# Patient Record
Sex: Female | Born: 1965 | Hispanic: No | Marital: Married | Smoking: Never smoker
Health system: Southern US, Community
[De-identification: ages and names within clinical notes are randomized; demographics above are authoritative.]

## PROBLEM LIST (undated history)

## (undated) DIAGNOSIS — E119 Type 2 diabetes mellitus without complications: Secondary | ICD-10-CM

## (undated) DIAGNOSIS — F32A Depression, unspecified: Secondary | ICD-10-CM

## (undated) DIAGNOSIS — F329 Major depressive disorder, single episode, unspecified: Secondary | ICD-10-CM

## (undated) DIAGNOSIS — E739 Lactose intolerance, unspecified: Secondary | ICD-10-CM

## (undated) DIAGNOSIS — K760 Fatty (change of) liver, not elsewhere classified: Secondary | ICD-10-CM

## (undated) DIAGNOSIS — J309 Allergic rhinitis, unspecified: Secondary | ICD-10-CM

## (undated) DIAGNOSIS — R6889 Other general symptoms and signs: Secondary | ICD-10-CM

## (undated) DIAGNOSIS — F419 Anxiety disorder, unspecified: Secondary | ICD-10-CM

## (undated) DIAGNOSIS — R5383 Other fatigue: Secondary | ICD-10-CM

## (undated) DIAGNOSIS — I10 Essential (primary) hypertension: Secondary | ICD-10-CM

## (undated) DIAGNOSIS — K0889 Other specified disorders of teeth and supporting structures: Secondary | ICD-10-CM

## (undated) DIAGNOSIS — E785 Hyperlipidemia, unspecified: Secondary | ICD-10-CM

## (undated) DIAGNOSIS — R109 Unspecified abdominal pain: Secondary | ICD-10-CM

## (undated) DIAGNOSIS — N644 Mastodynia: Secondary | ICD-10-CM

## (undated) DIAGNOSIS — R1011 Right upper quadrant pain: Secondary | ICD-10-CM

## (undated) DIAGNOSIS — L239 Allergic contact dermatitis, unspecified cause: Secondary | ICD-10-CM

## (undated) DIAGNOSIS — L210 Seborrhea capitis: Secondary | ICD-10-CM

## (undated) DIAGNOSIS — R51 Headache: Secondary | ICD-10-CM

## (undated) DIAGNOSIS — M79622 Pain in left upper arm: Secondary | ICD-10-CM

## (undated) DIAGNOSIS — M25512 Pain in left shoulder: Secondary | ICD-10-CM

## (undated) DIAGNOSIS — J329 Chronic sinusitis, unspecified: Secondary | ICD-10-CM

## (undated) DIAGNOSIS — I456 Pre-excitation syndrome: Secondary | ICD-10-CM

## (undated) DIAGNOSIS — R1030 Lower abdominal pain, unspecified: Secondary | ICD-10-CM

## (undated) DIAGNOSIS — S161XXA Strain of muscle, fascia and tendon at neck level, initial encounter: Secondary | ICD-10-CM

## (undated) DIAGNOSIS — M2550 Pain in unspecified joint: Secondary | ICD-10-CM

## (undated) DIAGNOSIS — Z9289 Personal history of other medical treatment: Secondary | ICD-10-CM

## (undated) DIAGNOSIS — N912 Amenorrhea, unspecified: Secondary | ICD-10-CM

## (undated) DIAGNOSIS — Z9889 Other specified postprocedural states: Secondary | ICD-10-CM

## (undated) DIAGNOSIS — F450 Somatization disorder: Secondary | ICD-10-CM

## (undated) DIAGNOSIS — R1013 Epigastric pain: Secondary | ICD-10-CM

## (undated) DIAGNOSIS — L309 Dermatitis, unspecified: Secondary | ICD-10-CM

## (undated) DIAGNOSIS — N764 Abscess of vulva: Secondary | ICD-10-CM

## (undated) DIAGNOSIS — R935 Abnormal findings on diagnostic imaging of other abdominal regions, including retroperitoneum: Secondary | ICD-10-CM

## (undated) DIAGNOSIS — R0981 Nasal congestion: Secondary | ICD-10-CM

## (undated) DIAGNOSIS — R300 Dysuria: Secondary | ICD-10-CM

## (undated) HISTORY — DX: Essential (primary) hypertension: I10

## (undated) HISTORY — DX: Lower abdominal pain, unspecified: R10.30

## (undated) HISTORY — PX: APPENDECTOMY: SHX54

## (undated) HISTORY — DX: Somatization disorder: F45.0

## (undated) HISTORY — DX: Other fatigue: R53.83

## (undated) HISTORY — DX: Type 2 diabetes mellitus without complications: E11.9

## (undated) HISTORY — DX: Hyperlipidemia, unspecified: E78.5

## (undated) HISTORY — DX: Abnormal findings on diagnostic imaging of other abdominal regions, including retroperitoneum: R93.5

## (undated) HISTORY — DX: Pre-excitation syndrome: I45.6

## (undated) HISTORY — DX: Depression, unspecified: F32.A

## (undated) HISTORY — DX: Anxiety disorder, unspecified: F41.9

## (undated) HISTORY — DX: Nasal congestion: R09.81

## (undated) HISTORY — DX: Headache: R51

## (undated) HISTORY — DX: Other specified postprocedural states: Z98.890

## (undated) HISTORY — DX: Fatty (change of) liver, not elsewhere classified: K76.0

## (undated) HISTORY — DX: Pain in left shoulder: M25.512

## (undated) HISTORY — DX: Other general symptoms and signs: R68.89

## (undated) HISTORY — DX: Strain of muscle, fascia and tendon at neck level, initial encounter: S16.1XXA

## (undated) HISTORY — DX: Major depressive disorder, single episode, unspecified: F32.9

## (undated) HISTORY — DX: Dermatitis, unspecified: L30.9

## (undated) HISTORY — DX: Allergic rhinitis, unspecified: J30.9

## (undated) HISTORY — PX: OTHER SURGICAL HISTORY: SHX169

## (undated) HISTORY — DX: Lactose intolerance, unspecified: E73.9

## (undated) HISTORY — DX: Dysuria: R30.0

## (undated) HISTORY — DX: Pain in left upper arm: M79.622

## (undated) HISTORY — DX: Seborrhea capitis: L21.0

## (undated) HISTORY — DX: Epigastric pain: R10.13

## (undated) HISTORY — DX: Personal history of other medical treatment: Z92.89

## (undated) HISTORY — DX: Right upper quadrant pain: R10.11

## (undated) HISTORY — DX: Other specified disorders of teeth and supporting structures: K08.89

## (undated) HISTORY — DX: Unspecified abdominal pain: R10.9

## (undated) HISTORY — DX: Amenorrhea, unspecified: N91.2

## (undated) HISTORY — DX: Chronic sinusitis, unspecified: J32.9

## (undated) HISTORY — DX: Mastodynia: N64.4

## (undated) HISTORY — DX: Allergic contact dermatitis, unspecified cause: L23.9

## (undated) HISTORY — DX: Pain in unspecified joint: M25.50

## (undated) HISTORY — DX: Abscess of vulva: N76.4

---

## 2001-10-26 ENCOUNTER — Inpatient Hospital Stay (HOSPITAL_COMMUNITY): Admission: AD | Admit: 2001-10-26 | Discharge: 2001-10-26 | Payer: Self-pay | Admitting: *Deleted

## 2001-10-28 ENCOUNTER — Emergency Department (HOSPITAL_COMMUNITY): Admission: EM | Admit: 2001-10-28 | Discharge: 2001-10-28 | Payer: Self-pay | Admitting: *Deleted

## 2001-12-14 ENCOUNTER — Emergency Department (HOSPITAL_COMMUNITY): Admission: EM | Admit: 2001-12-14 | Discharge: 2001-12-14 | Payer: Self-pay | Admitting: Family Medicine

## 2002-01-18 ENCOUNTER — Encounter: Payer: Self-pay | Admitting: Obstetrics and Gynecology

## 2002-01-18 ENCOUNTER — Ambulatory Visit (HOSPITAL_COMMUNITY): Admission: AD | Admit: 2002-01-18 | Discharge: 2002-01-19 | Payer: Self-pay | Admitting: Obstetrics and Gynecology

## 2002-01-21 ENCOUNTER — Inpatient Hospital Stay (HOSPITAL_COMMUNITY): Admission: AD | Admit: 2002-01-21 | Discharge: 2002-01-21 | Payer: Self-pay | Admitting: Obstetrics & Gynecology

## 2002-01-25 ENCOUNTER — Inpatient Hospital Stay (HOSPITAL_COMMUNITY): Admission: RE | Admit: 2002-01-25 | Discharge: 2002-01-25 | Payer: Self-pay | Admitting: Obstetrics and Gynecology

## 2002-01-25 ENCOUNTER — Encounter: Payer: Self-pay | Admitting: Obstetrics and Gynecology

## 2002-01-31 ENCOUNTER — Encounter: Admission: RE | Admit: 2002-01-31 | Discharge: 2002-01-31 | Payer: Self-pay | Admitting: *Deleted

## 2002-02-07 ENCOUNTER — Encounter: Admission: RE | Admit: 2002-02-07 | Discharge: 2002-02-07 | Payer: Self-pay | Admitting: *Deleted

## 2002-02-11 ENCOUNTER — Encounter: Payer: Self-pay | Admitting: Obstetrics & Gynecology

## 2002-02-11 ENCOUNTER — Inpatient Hospital Stay: Admission: AD | Admit: 2002-02-11 | Discharge: 2002-02-11 | Payer: Self-pay | Admitting: Obstetrics and Gynecology

## 2002-02-13 ENCOUNTER — Encounter: Admission: RE | Admit: 2002-02-13 | Discharge: 2002-02-13 | Payer: Self-pay | Admitting: *Deleted

## 2002-02-21 ENCOUNTER — Encounter: Admission: RE | Admit: 2002-02-21 | Discharge: 2002-02-21 | Payer: Self-pay | Admitting: *Deleted

## 2002-03-18 ENCOUNTER — Inpatient Hospital Stay (HOSPITAL_COMMUNITY): Admission: AD | Admit: 2002-03-18 | Discharge: 2002-03-18 | Payer: Self-pay | Admitting: *Deleted

## 2002-05-08 ENCOUNTER — Inpatient Hospital Stay (HOSPITAL_COMMUNITY): Admission: AD | Admit: 2002-05-08 | Discharge: 2002-05-08 | Payer: Self-pay | Admitting: Obstetrics and Gynecology

## 2002-05-10 ENCOUNTER — Inpatient Hospital Stay (HOSPITAL_COMMUNITY): Admission: AD | Admit: 2002-05-10 | Discharge: 2002-05-10 | Payer: Self-pay | Admitting: Obstetrics and Gynecology

## 2002-05-12 ENCOUNTER — Inpatient Hospital Stay (HOSPITAL_COMMUNITY): Admission: AD | Admit: 2002-05-12 | Discharge: 2002-05-12 | Payer: Self-pay | Admitting: *Deleted

## 2002-05-14 ENCOUNTER — Ambulatory Visit (HOSPITAL_COMMUNITY): Admission: RE | Admit: 2002-05-14 | Discharge: 2002-05-14 | Payer: Self-pay | Admitting: *Deleted

## 2002-05-14 ENCOUNTER — Encounter: Payer: Self-pay | Admitting: *Deleted

## 2002-05-22 ENCOUNTER — Encounter: Admission: RE | Admit: 2002-05-22 | Discharge: 2002-05-22 | Payer: Self-pay | Admitting: *Deleted

## 2002-06-05 ENCOUNTER — Encounter: Admission: RE | Admit: 2002-06-05 | Discharge: 2002-06-05 | Payer: Self-pay | Admitting: *Deleted

## 2002-06-06 ENCOUNTER — Encounter (HOSPITAL_COMMUNITY): Admission: RE | Admit: 2002-06-06 | Discharge: 2002-09-04 | Payer: Self-pay | Admitting: Dentistry

## 2002-06-19 ENCOUNTER — Encounter: Admission: RE | Admit: 2002-06-19 | Discharge: 2002-06-19 | Payer: Self-pay | Admitting: *Deleted

## 2002-06-24 ENCOUNTER — Inpatient Hospital Stay (HOSPITAL_COMMUNITY): Admission: AD | Admit: 2002-06-24 | Discharge: 2002-06-24 | Payer: Self-pay | Admitting: *Deleted

## 2002-07-03 ENCOUNTER — Encounter: Admission: RE | Admit: 2002-07-03 | Discharge: 2002-07-03 | Payer: Self-pay | Admitting: *Deleted

## 2002-07-10 ENCOUNTER — Encounter (HOSPITAL_COMMUNITY): Admission: RE | Admit: 2002-07-10 | Discharge: 2002-08-09 | Payer: Self-pay | Admitting: Obstetrics and Gynecology

## 2002-07-10 ENCOUNTER — Encounter: Admission: RE | Admit: 2002-07-10 | Discharge: 2002-07-10 | Payer: Self-pay | Admitting: *Deleted

## 2002-07-17 ENCOUNTER — Inpatient Hospital Stay (HOSPITAL_COMMUNITY): Admission: AD | Admit: 2002-07-17 | Discharge: 2002-07-17 | Payer: Self-pay | Admitting: *Deleted

## 2002-07-24 ENCOUNTER — Encounter: Admission: RE | Admit: 2002-07-24 | Discharge: 2002-07-24 | Payer: Self-pay | Admitting: *Deleted

## 2002-08-07 ENCOUNTER — Encounter: Admission: RE | Admit: 2002-08-07 | Discharge: 2002-08-07 | Payer: Self-pay | Admitting: *Deleted

## 2002-08-21 ENCOUNTER — Encounter: Admission: RE | Admit: 2002-08-21 | Discharge: 2002-08-21 | Payer: Self-pay | Admitting: *Deleted

## 2002-08-28 ENCOUNTER — Encounter: Admission: RE | Admit: 2002-08-28 | Discharge: 2002-08-28 | Payer: Self-pay | Admitting: *Deleted

## 2002-09-04 ENCOUNTER — Encounter: Admission: RE | Admit: 2002-09-04 | Discharge: 2002-09-04 | Payer: Self-pay | Admitting: *Deleted

## 2002-09-11 ENCOUNTER — Encounter: Admission: RE | Admit: 2002-09-11 | Discharge: 2002-09-11 | Payer: Self-pay | Admitting: *Deleted

## 2002-09-11 ENCOUNTER — Inpatient Hospital Stay (HOSPITAL_COMMUNITY): Admission: AD | Admit: 2002-09-11 | Discharge: 2002-09-11 | Payer: Self-pay | Admitting: Family Medicine

## 2002-09-13 ENCOUNTER — Inpatient Hospital Stay (HOSPITAL_COMMUNITY): Admission: AD | Admit: 2002-09-13 | Discharge: 2002-09-21 | Payer: Self-pay | Admitting: *Deleted

## 2002-09-20 ENCOUNTER — Encounter: Payer: Self-pay | Admitting: *Deleted

## 2002-09-30 ENCOUNTER — Inpatient Hospital Stay (HOSPITAL_COMMUNITY): Admission: AD | Admit: 2002-09-30 | Discharge: 2002-09-30 | Payer: Self-pay | Admitting: *Deleted

## 2002-10-04 ENCOUNTER — Inpatient Hospital Stay (HOSPITAL_COMMUNITY): Admission: AD | Admit: 2002-10-04 | Discharge: 2002-10-04 | Payer: Self-pay | Admitting: Family Medicine

## 2005-01-10 DIAGNOSIS — R109 Unspecified abdominal pain: Secondary | ICD-10-CM | POA: Insufficient documentation

## 2005-01-10 HISTORY — DX: Unspecified abdominal pain: R10.9

## 2005-01-11 ENCOUNTER — Ambulatory Visit (HOSPITAL_COMMUNITY): Admission: RE | Admit: 2005-01-11 | Discharge: 2005-01-11 | Payer: Self-pay | Admitting: Internal Medicine

## 2005-01-17 ENCOUNTER — Ambulatory Visit: Payer: Self-pay | Admitting: Internal Medicine

## 2005-01-17 ENCOUNTER — Ambulatory Visit: Payer: Self-pay | Admitting: Family Medicine

## 2005-01-19 ENCOUNTER — Ambulatory Visit: Payer: Self-pay | Admitting: Family Medicine

## 2005-01-20 ENCOUNTER — Ambulatory Visit (HOSPITAL_COMMUNITY): Admission: RE | Admit: 2005-01-20 | Discharge: 2005-01-20 | Payer: Self-pay | Admitting: Family Medicine

## 2005-01-20 DIAGNOSIS — R935 Abnormal findings on diagnostic imaging of other abdominal regions, including retroperitoneum: Secondary | ICD-10-CM

## 2005-01-20 HISTORY — DX: Abnormal findings on diagnostic imaging of other abdominal regions, including retroperitoneum: R93.5

## 2005-01-24 ENCOUNTER — Ambulatory Visit: Payer: Self-pay | Admitting: Family Medicine

## 2005-01-24 ENCOUNTER — Ambulatory Visit: Payer: Self-pay | Admitting: *Deleted

## 2005-01-28 ENCOUNTER — Ambulatory Visit: Payer: Self-pay | Admitting: Internal Medicine

## 2005-02-15 ENCOUNTER — Ambulatory Visit: Payer: Self-pay | Admitting: Internal Medicine

## 2005-04-04 ENCOUNTER — Ambulatory Visit: Payer: Self-pay | Admitting: Internal Medicine

## 2005-05-24 ENCOUNTER — Emergency Department (HOSPITAL_COMMUNITY): Admission: EM | Admit: 2005-05-24 | Discharge: 2005-05-24 | Payer: Self-pay | Admitting: Emergency Medicine

## 2005-05-26 ENCOUNTER — Ambulatory Visit: Payer: Self-pay | Admitting: Family Medicine

## 2005-05-30 ENCOUNTER — Ambulatory Visit: Payer: Self-pay | Admitting: Internal Medicine

## 2005-06-13 ENCOUNTER — Other Ambulatory Visit: Admission: RE | Admit: 2005-06-13 | Discharge: 2005-06-13 | Payer: Self-pay | Admitting: Obstetrics and Gynecology

## 2005-07-20 ENCOUNTER — Ambulatory Visit (HOSPITAL_COMMUNITY): Admission: RE | Admit: 2005-07-20 | Discharge: 2005-07-20 | Payer: Self-pay | Admitting: Obstetrics and Gynecology

## 2005-09-12 ENCOUNTER — Ambulatory Visit: Payer: Self-pay | Admitting: Internal Medicine

## 2005-09-27 ENCOUNTER — Ambulatory Visit: Payer: Self-pay | Admitting: Internal Medicine

## 2005-09-30 DIAGNOSIS — Z9289 Personal history of other medical treatment: Secondary | ICD-10-CM

## 2005-09-30 HISTORY — DX: Personal history of other medical treatment: Z92.89

## 2005-11-15 ENCOUNTER — Ambulatory Visit (HOSPITAL_COMMUNITY): Admission: RE | Admit: 2005-11-15 | Discharge: 2005-11-15 | Payer: Self-pay | Admitting: Gastroenterology

## 2005-11-23 ENCOUNTER — Ambulatory Visit (HOSPITAL_COMMUNITY): Admission: RE | Admit: 2005-11-23 | Discharge: 2005-11-23 | Payer: Self-pay | Admitting: Gastroenterology

## 2005-12-09 ENCOUNTER — Ambulatory Visit (HOSPITAL_COMMUNITY): Admission: RE | Admit: 2005-12-09 | Discharge: 2005-12-09 | Payer: Self-pay | Admitting: Gastroenterology

## 2005-12-16 ENCOUNTER — Emergency Department (HOSPITAL_COMMUNITY): Admission: EM | Admit: 2005-12-16 | Discharge: 2005-12-16 | Payer: Self-pay | Admitting: Emergency Medicine

## 2005-12-19 ENCOUNTER — Ambulatory Visit: Payer: Self-pay | Admitting: Internal Medicine

## 2006-01-19 ENCOUNTER — Inpatient Hospital Stay (HOSPITAL_COMMUNITY): Admission: AD | Admit: 2006-01-19 | Discharge: 2006-01-19 | Payer: Self-pay | Admitting: Obstetrics and Gynecology

## 2006-01-22 ENCOUNTER — Inpatient Hospital Stay (HOSPITAL_COMMUNITY): Admission: AD | Admit: 2006-01-22 | Discharge: 2006-01-22 | Payer: Self-pay | Admitting: Obstetrics and Gynecology

## 2006-01-24 ENCOUNTER — Ambulatory Visit (HOSPITAL_COMMUNITY): Admission: AD | Admit: 2006-01-24 | Discharge: 2006-01-24 | Payer: Self-pay | Admitting: Obstetrics and Gynecology

## 2006-01-24 ENCOUNTER — Encounter (INDEPENDENT_AMBULATORY_CARE_PROVIDER_SITE_OTHER): Payer: Self-pay | Admitting: Specialist

## 2006-05-22 ENCOUNTER — Ambulatory Visit: Payer: Self-pay | Admitting: Internal Medicine

## 2006-05-23 ENCOUNTER — Ambulatory Visit: Payer: Self-pay | Admitting: Family Medicine

## 2006-06-13 ENCOUNTER — Ambulatory Visit: Payer: Self-pay | Admitting: Family Medicine

## 2006-08-30 ENCOUNTER — Ambulatory Visit: Payer: Self-pay | Admitting: Family Medicine

## 2006-09-05 ENCOUNTER — Encounter: Admission: RE | Admit: 2006-09-05 | Discharge: 2006-09-05 | Payer: Self-pay | Admitting: Family Medicine

## 2006-11-17 ENCOUNTER — Ambulatory Visit: Payer: Self-pay | Admitting: Internal Medicine

## 2007-03-10 DIAGNOSIS — F3289 Other specified depressive episodes: Secondary | ICD-10-CM | POA: Insufficient documentation

## 2007-03-10 DIAGNOSIS — F329 Major depressive disorder, single episode, unspecified: Secondary | ICD-10-CM

## 2007-03-10 DIAGNOSIS — N644 Mastodynia: Secondary | ICD-10-CM | POA: Insufficient documentation

## 2007-03-10 HISTORY — DX: Other specified depressive episodes: F32.89

## 2007-03-10 HISTORY — DX: Mastodynia: N64.4

## 2007-03-10 HISTORY — DX: Major depressive disorder, single episode, unspecified: F32.9

## 2007-03-18 ENCOUNTER — Inpatient Hospital Stay (HOSPITAL_COMMUNITY): Admission: AD | Admit: 2007-03-18 | Discharge: 2007-03-18 | Payer: Self-pay | Admitting: Family Medicine

## 2007-03-27 ENCOUNTER — Inpatient Hospital Stay (HOSPITAL_COMMUNITY): Admission: AD | Admit: 2007-03-27 | Discharge: 2007-03-27 | Payer: Self-pay | Admitting: Obstetrics & Gynecology

## 2007-04-09 ENCOUNTER — Inpatient Hospital Stay (HOSPITAL_COMMUNITY): Admission: AD | Admit: 2007-04-09 | Discharge: 2007-04-09 | Payer: Self-pay | Admitting: Obstetrics and Gynecology

## 2007-04-17 ENCOUNTER — Inpatient Hospital Stay (HOSPITAL_COMMUNITY): Admission: AD | Admit: 2007-04-17 | Discharge: 2007-04-17 | Payer: Self-pay | Admitting: Obstetrics & Gynecology

## 2007-04-19 ENCOUNTER — Inpatient Hospital Stay (HOSPITAL_COMMUNITY): Admission: AD | Admit: 2007-04-19 | Discharge: 2007-04-19 | Payer: Self-pay | Admitting: Obstetrics and Gynecology

## 2007-05-04 ENCOUNTER — Inpatient Hospital Stay (HOSPITAL_COMMUNITY): Admission: AD | Admit: 2007-05-04 | Discharge: 2007-05-04 | Payer: Self-pay | Admitting: Obstetrics & Gynecology

## 2007-07-11 ENCOUNTER — Ambulatory Visit (HOSPITAL_COMMUNITY): Admission: RE | Admit: 2007-07-11 | Discharge: 2007-07-11 | Payer: Self-pay | Admitting: Obstetrics and Gynecology

## 2007-10-30 ENCOUNTER — Inpatient Hospital Stay (HOSPITAL_COMMUNITY): Admission: AD | Admit: 2007-10-30 | Discharge: 2007-10-30 | Payer: Self-pay | Admitting: Obstetrics and Gynecology

## 2007-11-01 HISTORY — PX: CHOLECYSTECTOMY: SHX55

## 2007-11-09 ENCOUNTER — Inpatient Hospital Stay (HOSPITAL_COMMUNITY): Admission: AD | Admit: 2007-11-09 | Discharge: 2007-11-09 | Payer: Self-pay | Admitting: Gynecology

## 2007-11-09 ENCOUNTER — Other Ambulatory Visit: Payer: Self-pay | Admitting: Obstetrics and Gynecology

## 2007-11-10 ENCOUNTER — Inpatient Hospital Stay (HOSPITAL_COMMUNITY): Admission: AD | Admit: 2007-11-10 | Discharge: 2007-11-13 | Payer: Self-pay | Admitting: Obstetrics & Gynecology

## 2007-11-10 ENCOUNTER — Ambulatory Visit: Payer: Self-pay | Admitting: Obstetrics & Gynecology

## 2007-11-12 ENCOUNTER — Encounter: Payer: Self-pay | Admitting: Obstetrics & Gynecology

## 2007-11-15 ENCOUNTER — Inpatient Hospital Stay (HOSPITAL_COMMUNITY): Admission: AD | Admit: 2007-11-15 | Discharge: 2007-11-16 | Payer: Self-pay | Admitting: Obstetrics & Gynecology

## 2007-11-15 ENCOUNTER — Ambulatory Visit: Payer: Self-pay | Admitting: Obstetrics and Gynecology

## 2007-11-28 ENCOUNTER — Ambulatory Visit: Payer: Self-pay | Admitting: Obstetrics and Gynecology

## 2007-12-04 ENCOUNTER — Emergency Department (HOSPITAL_COMMUNITY): Admission: EM | Admit: 2007-12-04 | Discharge: 2007-12-04 | Payer: Self-pay | Admitting: Emergency Medicine

## 2007-12-07 ENCOUNTER — Inpatient Hospital Stay (HOSPITAL_COMMUNITY): Admission: EM | Admit: 2007-12-07 | Discharge: 2007-12-09 | Payer: Self-pay | Admitting: General Surgery

## 2007-12-07 ENCOUNTER — Encounter (INDEPENDENT_AMBULATORY_CARE_PROVIDER_SITE_OTHER): Payer: Self-pay | Admitting: General Surgery

## 2008-04-09 ENCOUNTER — Emergency Department (HOSPITAL_COMMUNITY): Admission: EM | Admit: 2008-04-09 | Discharge: 2008-04-09 | Payer: Self-pay | Admitting: Emergency Medicine

## 2008-10-25 IMAGING — CT CT PELVIS W/ CM
3 of 5 series · 14 of 32 positions shown, 19 images · IV contrast ([ID] GASTRO-MX & [ID] OMNIP 300%)
Comparison: 05/24/05.

CLINICAL DATA: Post-op from caesarean section five days ago.   Worsening lower abdominal and pelvic pain.   Dysuria.   
 ABDOMEN CT WITH CONTRAST:
TECHNIQUE: Multidetector CT imaging of the abdomen was performed following the standard protocol during bolus administration of intravenous contrast. 
 Contrast: 100 cc Omnipaque 300 and oral contrast.
TECHNIQUE: Multidetector CT imaging of the pelvis was performed following the standard protocol during bolus administration of intravenous contrast.

[Series 2: abd pelvis · axial · 0.70mm/px · z∈[-317,-87]mm · 4 of 78 slices shown, 9 images]
[im 16/78  soft-tissue]
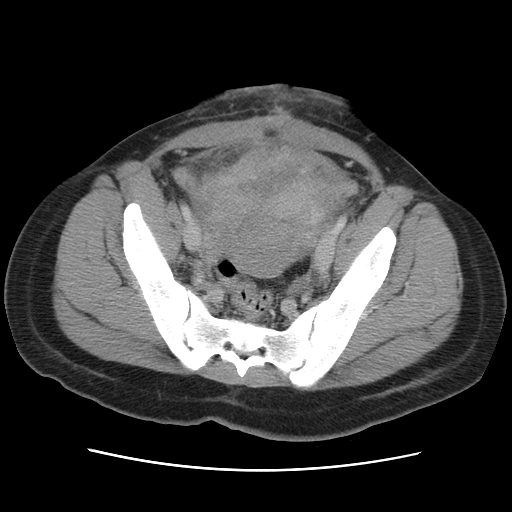
[im 16/78  lung]
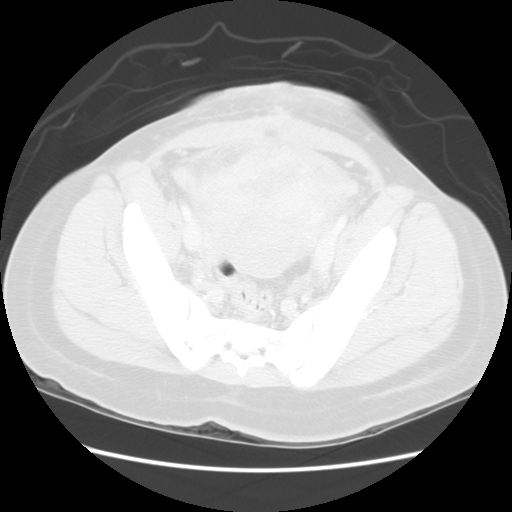
[im 16/78  bone]
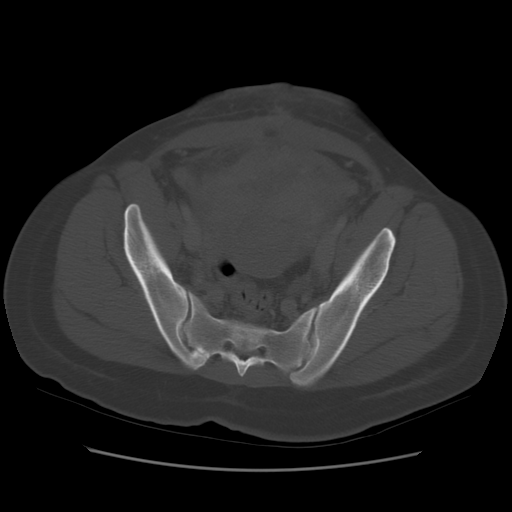
[im 31/78  soft-tissue]
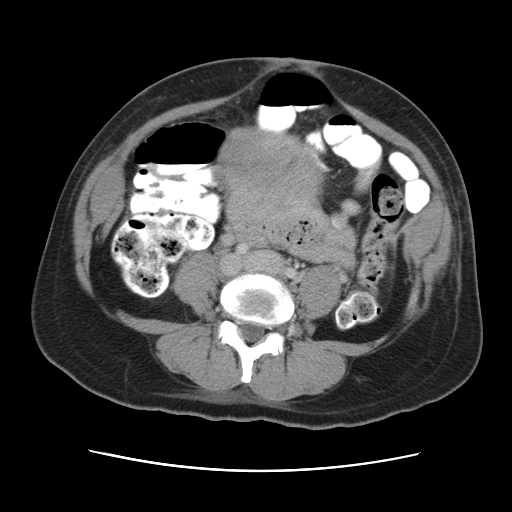
[im 31/78  lung]
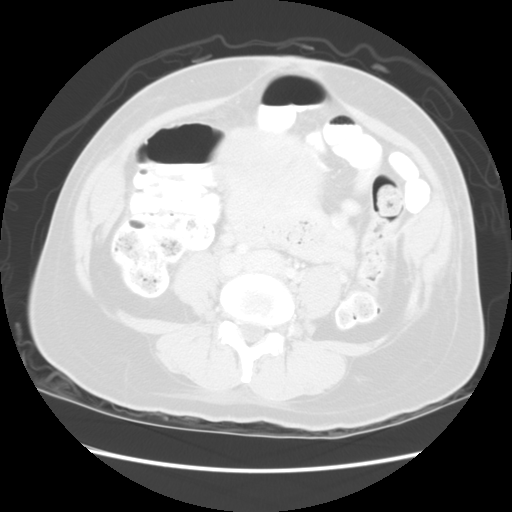
[im 47/78  soft-tissue]
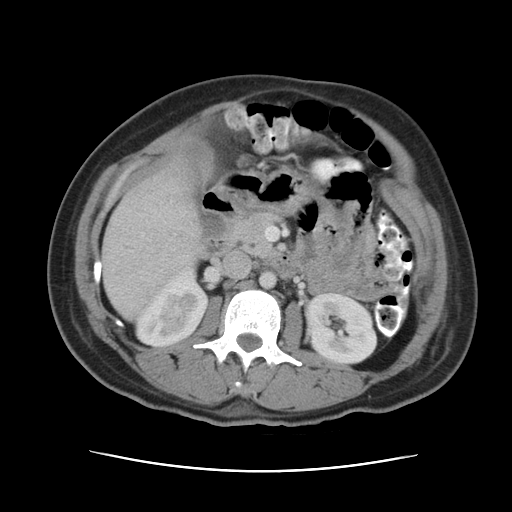
[im 47/78  lung]
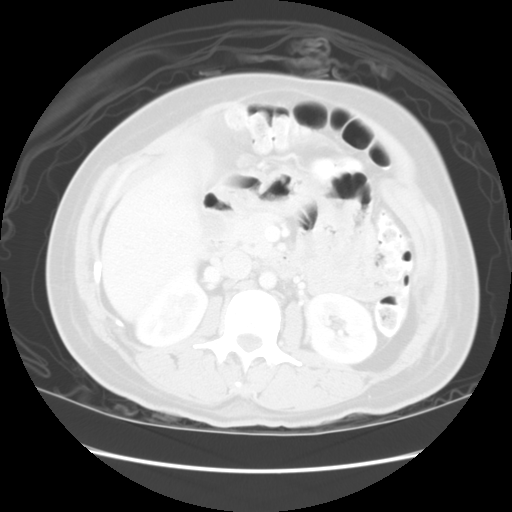
[im 62/78  soft-tissue]
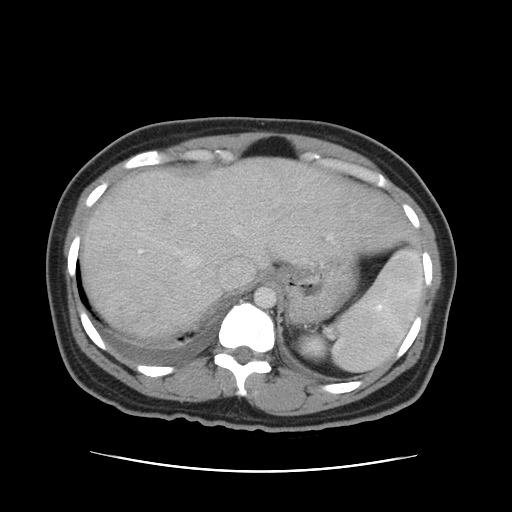
[im 62/78  lung]
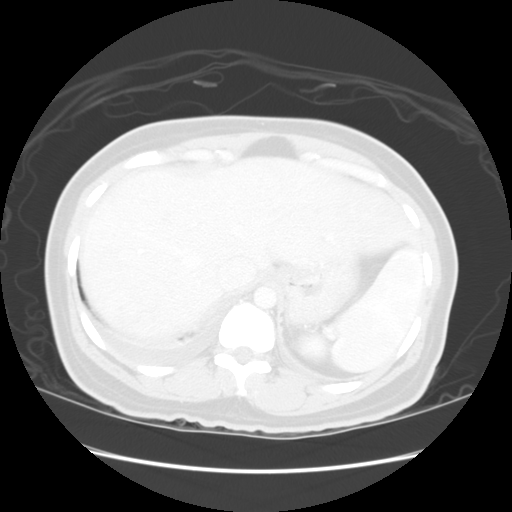

[Series 400: reformatted · coronal · 0.84mm/px · 2 of 132 slices shown (1 of 2)]
[im 15/132  soft-tissue]
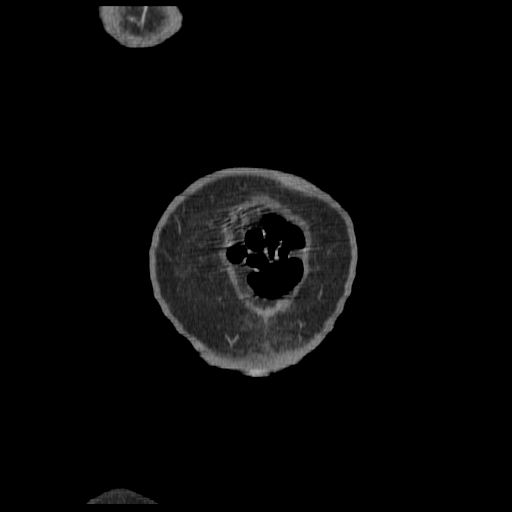
[im 30/132  soft-tissue]
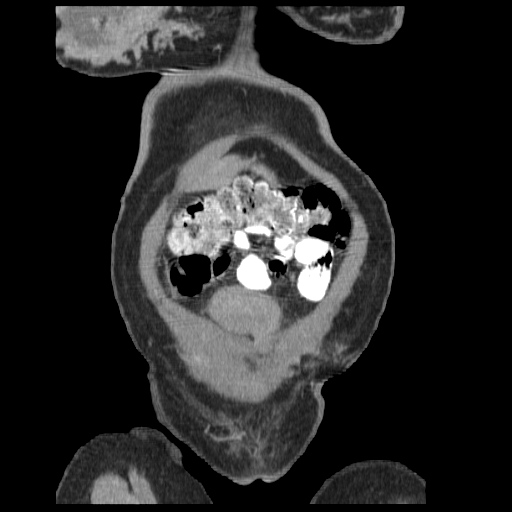

[Series 401: reformatted · sagittal · 0.84mm/px · 8 of 153 slices shown (2 of 2)]
[im 14/153  soft-tissue]
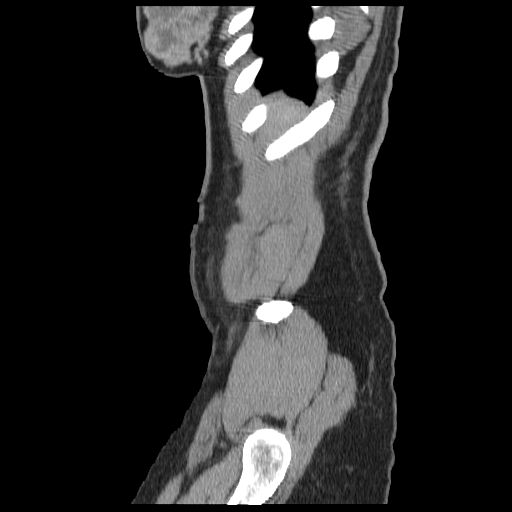
[im 28/153  soft-tissue]
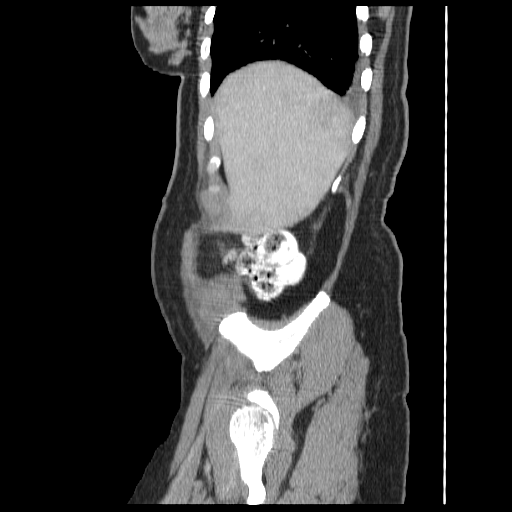
[im 56/153  soft-tissue]
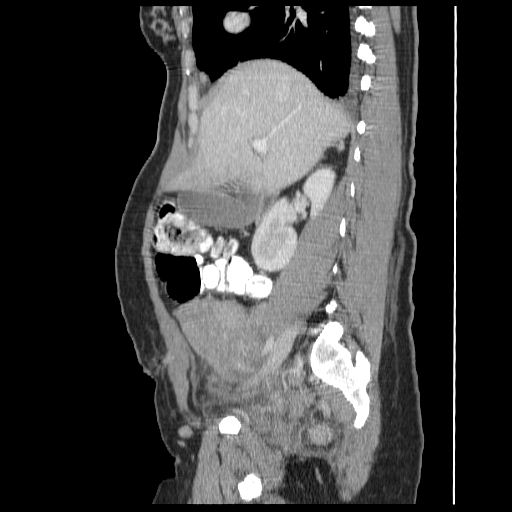
[im 70/153  soft-tissue]
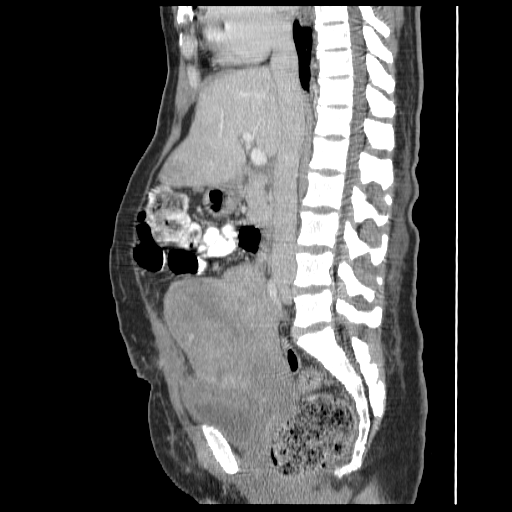
[im 83/153  soft-tissue]
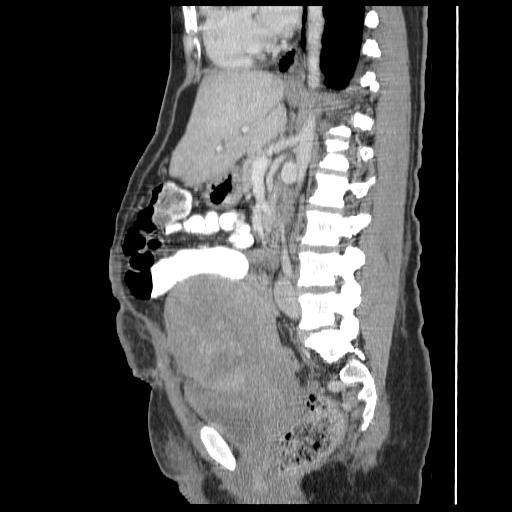
[im 97/153  soft-tissue]
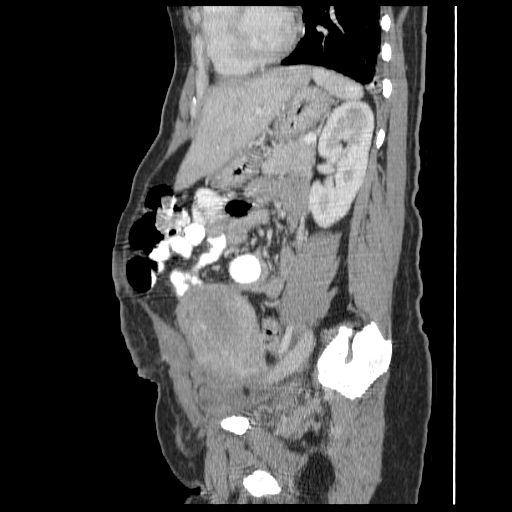
[im 125/153  soft-tissue]
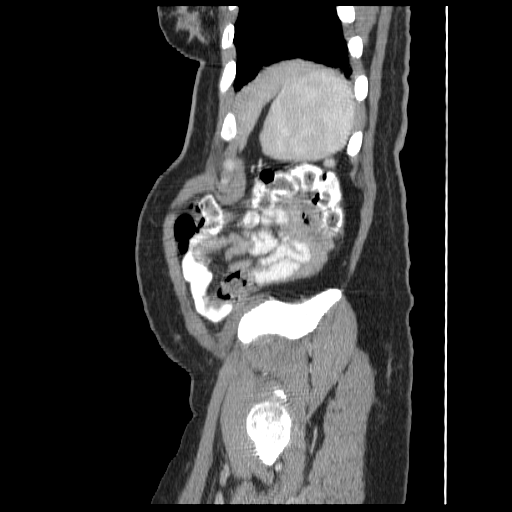
[im 139/153  soft-tissue]
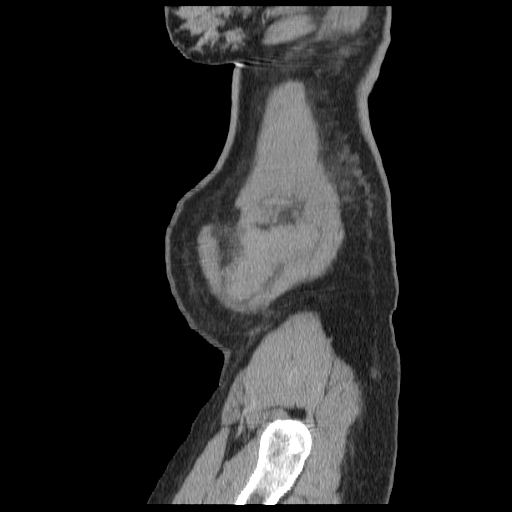

[14 of 32 positions shown; findings below may reference images not displayed]

FINDINGS: Tiny pleural effusions are seen bilaterally.  The abdominal parenchymal organs are unremarkable in appearance.  There is no evidence of hydronephrosis.  No soft tissue masses or lymphadenopathy are identified within the abdomen.  There is no evidence of abscess or free fluid.  Generalized distention of small and large bowel is seen consistent with post-op ileus.
IMPRESSION: 1.  Mild post-op ileus.
 2.  No evidence of abscess.
 3.  Tiny bilateral pleural effusions.
 PELVIS CT WITH CONTRAST:
FINDINGS: An enlarged post partum uterus is seen.  Expected postoperative changes from caesarean section are noted.  There is no evidence of pelvic mass or hematoma.  There is no evidence of abscess or free fluid.
IMPRESSION: Expected postoperative changes from recent caesarean section.  No evidence of pelvic abscess or other significant abnormality.

## 2009-06-30 ENCOUNTER — Ambulatory Visit: Payer: Self-pay | Admitting: Internal Medicine

## 2009-06-30 DIAGNOSIS — J309 Allergic rhinitis, unspecified: Secondary | ICD-10-CM

## 2009-06-30 HISTORY — DX: Allergic rhinitis, unspecified: J30.9

## 2009-11-11 ENCOUNTER — Encounter (INDEPENDENT_AMBULATORY_CARE_PROVIDER_SITE_OTHER): Payer: Self-pay | Admitting: Internal Medicine

## 2009-12-10 ENCOUNTER — Emergency Department (HOSPITAL_COMMUNITY): Admission: EM | Admit: 2009-12-10 | Discharge: 2009-12-10 | Payer: Self-pay | Admitting: Emergency Medicine

## 2009-12-15 ENCOUNTER — Telehealth (INDEPENDENT_AMBULATORY_CARE_PROVIDER_SITE_OTHER): Payer: Self-pay | Admitting: Internal Medicine

## 2009-12-16 ENCOUNTER — Ambulatory Visit: Payer: Self-pay | Admitting: Internal Medicine

## 2009-12-16 LAB — CONVERTED CEMR LAB
Blood in Urine, dipstick: NEGATIVE
Ketones, urine, test strip: NEGATIVE
Nitrite: NEGATIVE
Urobilinogen, UA: 0.2

## 2010-01-02 ENCOUNTER — Encounter (INDEPENDENT_AMBULATORY_CARE_PROVIDER_SITE_OTHER): Payer: Self-pay | Admitting: Internal Medicine

## 2010-01-12 ENCOUNTER — Encounter (INDEPENDENT_AMBULATORY_CARE_PROVIDER_SITE_OTHER): Payer: Self-pay | Admitting: Internal Medicine

## 2010-03-26 ENCOUNTER — Encounter (INDEPENDENT_AMBULATORY_CARE_PROVIDER_SITE_OTHER): Payer: Self-pay | Admitting: Internal Medicine

## 2010-05-04 ENCOUNTER — Emergency Department (HOSPITAL_COMMUNITY): Admission: EM | Admit: 2010-05-04 | Discharge: 2010-05-04 | Payer: Self-pay | Admitting: Family Medicine

## 2010-05-10 ENCOUNTER — Emergency Department (HOSPITAL_COMMUNITY): Admission: EM | Admit: 2010-05-10 | Discharge: 2010-05-10 | Payer: Self-pay | Admitting: Emergency Medicine

## 2010-10-30 ENCOUNTER — Emergency Department (HOSPITAL_COMMUNITY)
Admission: EM | Admit: 2010-10-30 | Discharge: 2010-10-30 | Payer: Self-pay | Source: Home / Self Care | Admitting: Emergency Medicine

## 2010-10-30 ENCOUNTER — Encounter: Payer: Self-pay | Admitting: Physician Assistant

## 2010-11-04 ENCOUNTER — Encounter: Payer: Self-pay | Admitting: Physician Assistant

## 2010-11-04 ENCOUNTER — Ambulatory Visit
Admission: RE | Admit: 2010-11-04 | Discharge: 2010-11-04 | Payer: Self-pay | Source: Home / Self Care | Attending: Physician Assistant | Admitting: Physician Assistant

## 2010-11-04 ENCOUNTER — Encounter: Payer: Self-pay | Admitting: Internal Medicine

## 2010-11-04 DIAGNOSIS — I456 Pre-excitation syndrome: Secondary | ICD-10-CM | POA: Insufficient documentation

## 2010-11-04 HISTORY — DX: Pre-excitation syndrome: I45.6

## 2010-11-10 ENCOUNTER — Ambulatory Visit (HOSPITAL_COMMUNITY)
Admission: RE | Admit: 2010-11-10 | Discharge: 2010-11-10 | Payer: Self-pay | Source: Home / Self Care | Attending: Physician Assistant | Admitting: Physician Assistant

## 2010-11-10 ENCOUNTER — Ambulatory Visit: Admission: RE | Admit: 2010-11-10 | Discharge: 2010-11-10 | Payer: Self-pay | Source: Home / Self Care

## 2010-11-10 ENCOUNTER — Encounter: Payer: Self-pay | Admitting: Internal Medicine

## 2010-11-21 ENCOUNTER — Encounter: Payer: Self-pay | Admitting: Gastroenterology

## 2010-11-30 NOTE — Letter (Signed)
Summary: *HSN Results Follow up  [facility]  [address] [place], [place] [address]   Phone: [phone]  Fax: [phone]      01/02/2010   [name] [address] [place], [place]  [address]   Dear  Ms. [name],                            ____S.Drinkard,FNP   ____D. Gore,FNP       ____B. McPherson,MD   ____V. Rankins,MD    _X___[name],MD    ____N. Daphine Deutscher, FNP  ____D. Reche Dixon, MD    ____K. Philipp Deputy, MD    ____Other     This letter is to inform you that your recent test(s):  _______Pap Smear    __X_____Lab Test     _______X-ray    ___X____ is within acceptable limits  _______ requires a medication change  _______ requires a follow-up lab visit  _______ requires a follow-up visit with your provider   Comments:  No evidence of celiac disease.  Will be interested to hear if avoiding dairy or using Lactaid has helped.       _________________________________________________________ If you have any questions, please contact our office                     Sincerely,  Julieanne Manson MD [facility]

## 2010-11-30 NOTE — Letter (Signed)
Summary: RECORDS RECEIVED FROM [facility] RECEIVED FROM [facility]   Imported By: Arta Bruce 05/25/2010 11:37:56  _____________________________________________________________________  External Attachment:    Type:   Image     Comment:   External Document

## 2010-11-30 NOTE — Progress Notes (Signed)
Summary: ZO:XWRUEA care visit and need for pelvic u/s  Phone Note Other Incoming   Caller: Cherly Anderson RN Reason for Call: Discuss lab or test results Summary of Call: RN from Urgent Care at [facility] called stating Dr. Ladon Applebaum saw this patient on 12/10/09. Patient has a sed rate of 30 and has pelvic pain. His recommendation was to have her primary office set up a pelvic u/s for her. This RN advised will notify her primary, Dr. Delrae Alfred. Initial call taken by: Sharen Heck RN,  December 15, 2009 12:38 PM  Follow-up for Phone Call        here today Follow-up by: Julieanne Manson MD,  December 16, 2009 10:13 AM

## 2010-11-30 NOTE — Letter (Signed)
Summary: [facility]ref[facility]ed 03/22/10  [facility]ing [facility] 03/22/10   Imported By: Silvio Pate Stanislawscyk 03/22/2010 11:36:10  _____________________________________________________________________  External Attachment:    Type:   Image     Comment:   External Document

## 2010-11-30 NOTE — Assessment & Plan Note (Signed)
Summary: 2209 PT/ STOMACH PAIN//GK   Vital Signs:  Patient profile:   45 year old female Weight:      135 pounds Temp:     98.3 degrees F Pulse rate:   77 / minute Pulse rhythm:   regular Resp:     20 per minute BP sitting:   132 / 83  (left arm) Cuff size:   regular  Vitals Entered By: Vesta Mixer CMA (December 16, 2009 9:27 AM) CC: last week had some bad abdominal pains, went to urgent care and told to f/u here. Is Patient Diabetic? No Pain Assessment Patient in pain? no       Does patient need assistance? Ambulation Normal   CC:  last week had some bad abdominal pains and went to urgent care and told to f/u here.Marland Kitchen  History of Present Illness: 1.  Abdominal  pain :  Right upper abdominal pain radiating to right groin and across to left lower quadrant.  Has noted off and on for 1 year.  Pt. states the bilateral lower quadrant pain occurs about 14 days after her period stops until the next period starts.  Pt. had gallbladder removed in 2009--states it helped some of her recurrent chronic abdominal/pelvic paint.  Pain in RUQ can happen at any time--generally feels better after passes flatus, a BM or even if urinates.  No definite hematochezia or melena.  No hematuria.  Has had some burning in urination this morning -- just today.  No nausea or vomiting.  Has cramping pain in right upper quadrant and diarrhea.  Has noted this with ice cream.  Will occasionally have diarrhea for a couple of episodes, preceded by the cramping.  Does not really eat or drink dairy products.   Pt. not aware of anyone else in family with difficulties digesting milk.  No definite fever with any of this.   Pt. has had extensive workups for her chronic abdominal pain, starting in 2006.  This includes abdominal and pelvic CTs, Ultrasounds, Hepatobiliary scan, barium enema, barium swallow and multiple lab testing--all of it appears unremarkable.  She has had a mildly elevated Sed rate for some time--upper 20s to  low 30s.  Similar Sed rate in Urgent Care for same complaint on 12/10/09.  Labs otherwise unremarkable.  Pt. was also seen by Dr. Evette Cristal, GI and also was sent to Gynecology clinic, but cannot find records regarding.  Allergies (verified): No Known Drug Allergies  Physical Exam  General:  NAD Abdomen:  soft.  Mild tenderness in RUQ and mid abdomen.  A bit more tender in bilateral lower quadrants--no mass noted.  No rebound or peritoneal signs.  BS present throughout and normal.  No HSM   Impression & Recommendations:  Problem # 1:  SYMPTOM, PAIN, ABDOMINAL, OTH SPECIFIED SITE (ICD-789.09)  Recurrent concerns. The right abdominal complaints seem related to when she had her tea and milk, perhaps a problem with lactose intolerance as she has similar problems with ice cream Encouraged a trial of stopping all dairy or switching to lactaid milk or taking Lactaid tabs with dairy.  The bilateral lower quadrant discomfort sounds like pain from ovulating, mittelshmerz, though can also suspect endometriosis--pt. however, has had 3 C sections and apparently this was not noted.  She was supposed to be seen by gynecology at one point for this, but not clear that happened--checking for those records.  Orders: T- * Misc. Laboratory test [phone]) UA Dipstick w/o Micro (manual) ([id number])  Complete Medication List: 1)  Fexofenadine Hcl 180 Mg Tabs (Fexofenadine hcl) .Marland Kitchen.. 1 tab by mouth daily as needed allergies 2)  Fluticasone Propionate 50 Mcg/act Susp (Fluticasone propionate) .... 2 sprays each nostril daily as needed allergies  Patient Instructions: 1)  No milk or try Lactaid milk or Lactaid tablets whenever you eat or drink dairy 2)  Follow up with Dr. Delrae Alfred in 3 months --abdominal pain.  Laboratory Results   Urine Tests    Routine Urinalysis   Glucose: negative   (Normal Range: Negative) Bilirubin: negative   (Normal Range: Negative) Ketone: negative   (Normal Range: Negative) Spec.  Gravity: 1.010   (Normal Range: 1.003-1.035) Blood: negative   (Normal Range: Negative) pH: 5.5   (Normal Range: 5.0-8.0) Protein: negative   (Normal Range: Negative) Urobilinogen: 0.2   (Normal Range: 0-1) Nitrite: negative   (Normal Range: Negative) Leukocyte Esterace: negative   (Normal Range: Negative)

## 2010-12-01 ENCOUNTER — Encounter: Payer: Self-pay | Admitting: Internal Medicine

## 2010-12-01 ENCOUNTER — Ambulatory Visit: Admit: 2010-12-01 | Payer: Self-pay | Admitting: Internal Medicine

## 2010-12-01 ENCOUNTER — Institutional Professional Consult (permissible substitution) (INDEPENDENT_AMBULATORY_CARE_PROVIDER_SITE_OTHER): Payer: Self-pay | Admitting: Internal Medicine

## 2010-12-01 DIAGNOSIS — I456 Pre-excitation syndrome: Secondary | ICD-10-CM
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
## 2010-12-16 ENCOUNTER — Emergency Department (HOSPITAL_COMMUNITY): Payer: Self-pay

## 2010-12-16 ENCOUNTER — Emergency Department (HOSPITAL_COMMUNITY)
Admission: EM | Admit: 2010-12-16 | Discharge: 2010-12-16 | Disposition: A | Discharge status: Home / Self Care | Payer: Self-pay | Attending: Emergency Medicine | Admitting: Emergency Medicine

## 2010-12-16 DIAGNOSIS — R51 Headache: Secondary | ICD-10-CM | POA: Insufficient documentation

## 2010-12-16 DIAGNOSIS — M545 Low back pain, unspecified: Secondary | ICD-10-CM | POA: Insufficient documentation

## 2010-12-16 DIAGNOSIS — R296 Repeated falls: Secondary | ICD-10-CM | POA: Insufficient documentation

## 2010-12-16 DIAGNOSIS — R011 Cardiac murmur, unspecified: Secondary | ICD-10-CM | POA: Insufficient documentation

## 2010-12-16 DIAGNOSIS — H9209 Otalgia, unspecified ear: Secondary | ICD-10-CM | POA: Insufficient documentation

## 2010-12-16 DIAGNOSIS — R05 Cough: Secondary | ICD-10-CM | POA: Insufficient documentation

## 2010-12-16 DIAGNOSIS — I456 Pre-excitation syndrome: Secondary | ICD-10-CM | POA: Insufficient documentation

## 2010-12-16 DIAGNOSIS — R55 Syncope and collapse: Secondary | ICD-10-CM | POA: Insufficient documentation

## 2010-12-16 DIAGNOSIS — R059 Cough, unspecified: Secondary | ICD-10-CM | POA: Insufficient documentation

## 2010-12-16 DIAGNOSIS — R42 Dizziness and giddiness: Secondary | ICD-10-CM | POA: Insufficient documentation

## 2010-12-16 DIAGNOSIS — R209 Unspecified disturbances of skin sensation: Secondary | ICD-10-CM | POA: Insufficient documentation

## 2010-12-16 LAB — URINALYSIS, ROUTINE W REFLEX MICROSCOPIC
Hgb urine dipstick: NEGATIVE
Specific Gravity, Urine: 1.009 (ref 1.005–1.030)
Urine Glucose, Fasting: NEGATIVE mg/dL
Urobilinogen, UA: 0.2 mg/dL (ref 0.0–1.0)
pH: 6.5 (ref 5.0–8.0)

## 2010-12-16 LAB — BASIC METABOLIC PANEL
Calcium: 9.4 mg/dL (ref 8.4–10.5)
Chloride: 103 mEq/L (ref 96–112)
Creatinine, Ser: 0.71 mg/dL (ref 0.4–1.2)
GFR calc Af Amer: >60 mL/min (ref >60)

## 2010-12-16 LAB — CBC
MCH: 29.4 pg (ref 26.0–34.0)
Platelets: 248 10*3/uL (ref 150–400)
RBC: 4.28 MIL/uL (ref 3.87–5.11)

## 2010-12-16 LAB — DIFFERENTIAL
Basophils Relative: 1 % (ref 0–1)
Eosinophils Absolute: 0.2 10*3/uL (ref 0.0–0.7)
Monocytes Relative: 12 % (ref 3–12)
Neutrophils Relative %: 59 % (ref 43–77)

## 2010-12-16 LAB — POCT CARDIAC MARKERS
Myoglobin, poc: 74.8 ng/mL (ref 12–200)
Troponin i, poc: <0.05 ng/mL (ref 0.00–0.09)

## 2010-12-20 LAB — GLUCOSE, CAPILLARY: Glucose-Capillary: 117 mg/dL — ABNORMAL HIGH (ref 70–99)

## 2010-12-28 ENCOUNTER — Inpatient Hospital Stay (INDEPENDENT_AMBULATORY_CARE_PROVIDER_SITE_OTHER)
Admission: RE | Admit: 2010-12-28 | Discharge: 2010-12-28 | Disposition: A | Discharge status: Home / Self Care | Payer: Self-pay | Source: Ambulatory Visit

## 2010-12-28 DIAGNOSIS — J019 Acute sinusitis, unspecified: Secondary | ICD-10-CM

## 2010-12-28 DIAGNOSIS — R070 Pain in throat: Secondary | ICD-10-CM

## 2010-12-28 DIAGNOSIS — H109 Unspecified conjunctivitis: Secondary | ICD-10-CM

## 2010-12-28 NOTE — Letter (Signed)
Summary: [place] Release of Responsibility for Interpretation   [place] Release of Responsibility for Interpretation   Imported By: Roderic Ovens 12/17/2010 15:54:46  _____________________________________________________________________  External Attachment:    Type:   Image     Comment:   External Document

## 2011-01-10 LAB — POCT I-STAT, CHEM 8
Calcium, Ion: 1.09 mmol/L — ABNORMAL LOW (ref 1.12–1.32)
Glucose, Bld: 97 mg/dL (ref 70–99)
HCT: 41 % (ref 36.0–46.0)
Hemoglobin: 13.9 g/dL (ref 12.0–15.0)
Potassium: 3.9 mEq/L (ref 3.5–5.1)

## 2011-01-10 LAB — CARBOXYHEMOGLOBIN
Carboxyhemoglobin: 0.6 % (ref 0.5–1.5)
Methemoglobin: 0.7 % (ref 0.0–1.5)
O2 Saturation: 35.1 %

## 2011-01-10 LAB — POCT PREGNANCY, URINE: Preg Test, Ur: NEGATIVE

## 2011-01-10 LAB — CBC
MCH: 28.7 pg (ref 26.0–34.0)
MCHC: 32.8 g/dL (ref 30.0–36.0)
MCV: 87.4 fL (ref 78.0–100.0)
Platelets: 211 10*3/uL (ref 150–400)
RDW: 13.3 % (ref 11.5–15.5)
WBC: 4.8 10*3/uL (ref 4.0–10.5)

## 2011-01-10 LAB — DIFFERENTIAL
Basophils Absolute: 0 10*3/uL (ref 0.0–0.1)
Basophils Relative: 0 % (ref 0–1)
Eosinophils Absolute: 0.1 10*3/uL (ref 0.0–0.7)
Eosinophils Relative: 1 % (ref 0–5)

## 2011-01-10 LAB — POCT CARDIAC MARKERS: Myoglobin, poc: 126 ng/mL (ref 12–200)

## 2011-01-16 LAB — DIFFERENTIAL
Basophils Absolute: 0 10*3/uL (ref 0.0–0.1)
Lymphocytes Relative: 26 % (ref 12–46)
Monocytes Absolute: 0.6 10*3/uL (ref 0.1–1.0)
Neutro Abs: 4.2 10*3/uL (ref 1.7–7.7)

## 2011-01-16 LAB — URINALYSIS, ROUTINE W REFLEX MICROSCOPIC
Bilirubin Urine: NEGATIVE
Ketones, ur: NEGATIVE mg/dL
Nitrite: NEGATIVE
Protein, ur: NEGATIVE mg/dL
Urobilinogen, UA: 0.2 mg/dL (ref 0.0–1.0)

## 2011-01-16 LAB — LIPASE, BLOOD: Lipase: 39 U/L (ref 11–59)

## 2011-01-16 LAB — CBC
MCH: 30 pg (ref 26.0–34.0)
MCV: 89.5 fL (ref 78.0–100.0)
Platelets: 226 10*3/uL (ref 150–400)
RDW: 13.1 % (ref 11.5–15.5)

## 2011-01-16 LAB — POCT URINALYSIS DIP (DEVICE)
Bilirubin Urine: NEGATIVE
Ketones, ur: NEGATIVE mg/dL
pH: 6 (ref 5.0–8.0)

## 2011-01-16 LAB — COMPREHENSIVE METABOLIC PANEL
Albumin: 4.1 g/dL (ref 3.5–5.2)
BUN: 5 mg/dL — ABNORMAL LOW (ref 6–23)
Chloride: 106 mEq/L (ref 96–112)
Creatinine, Ser: 0.65 mg/dL (ref 0.4–1.2)
Total Bilirubin: 0.4 mg/dL (ref 0.3–1.2)

## 2011-01-16 LAB — URINE CULTURE

## 2011-01-19 LAB — POCT URINALYSIS DIP (DEVICE)
Bilirubin Urine: NEGATIVE
Glucose, UA: NEGATIVE mg/dL
Ketones, ur: NEGATIVE mg/dL
pH: 5 (ref 5.0–8.0)

## 2011-01-19 LAB — CBC
RBC: 4.13 MIL/uL (ref 3.87–5.11)
WBC: 6 10*3/uL (ref 4.0–10.5)

## 2011-01-19 LAB — POCT I-STAT, CHEM 8
BUN: 12 mg/dL (ref 6–23)
Creatinine, Ser: 0.6 mg/dL (ref 0.4–1.2)
Potassium: 4 mEq/L (ref 3.5–5.1)
Sodium: 137 mEq/L (ref 135–145)

## 2011-01-19 LAB — POCT PREGNANCY, URINE: Preg Test, Ur: NEGATIVE

## 2011-01-19 LAB — DIFFERENTIAL
Lymphocytes Relative: 29 % (ref 12–46)
Lymphs Abs: 1.7 10*3/uL (ref 0.7–4.0)
Monocytes Relative: 8 % (ref 3–12)
Neutro Abs: 3.6 10*3/uL (ref 1.7–7.7)
Neutrophils Relative %: 61 % (ref 43–77)

## 2011-01-19 LAB — POCT H PYLORI SCREEN: H. PYLORI SCREEN, POC: NEGATIVE

## 2011-03-15 NOTE — Group Therapy Note (Signed)
[name], [name] NO.:  [id number]   MEDICAL RECORD NO.:  [id number]          PATIENT TYPE:  WOC   LOCATION:  [facility]                   FACILITY:  [facility]   PHYSICIAN:  Argentina Donovan, MD        DATE OF BIRTH:  [birth year]   DATE OF SERVICE:  11/28/2007                                  CLINIC NOTE   The patient is a 45 year old Arab female 18 days postpartum by C-section  who was admitted shortly afterwards because of suspected endometritis  and found mainly to have normal postoperative pain.  The patient was not  taking her medications.  She comes in today for follow-up. The abdomen  is soft, the wound is healing very well and complains now of loss of  appetite of right facial pain and headache.   IMPRESSION:  Acute sinusitis.  The patient has been given a Z-pack and a  Medrol Dosepak and also a prescription for Eldertonic.           ______________________________  Argentina Donovan, MD     PR/MEDQ  D:  11/28/2007  T:  11/29/2007  Job:  [id number]

## 2011-03-15 NOTE — Op Note (Signed)
[name], [name]              ACCOUNT NO.:  [id number]   MEDICAL RECORD NO.:  [id number]          PATIENT TYPE:  INP   LOCATION:  9144                          [facility]ACILITY:  [facility]   PHYSICIAN:  Allie Bossier, MD        DATE OF BIRTH:  [birth year]   DATE OF PROCEDURE:  11/11/2007  DATE OF DISCHARGE:                               OPERATIVE REPORT   PREOPERATIVE DIAGNOSES:  1. Term pregnancy in labor.  2. Previous C-section x2.   POSTOPERATIVE DIAGNOSES:  1. Term pregnancy in labor.  2. Previous C-section x2.   PROCEDURE:  Repeat low transverse cesarean section.   SURGEON:  Allie Bossier, MD   ANESTHESIOLOGIST:  Burnett Corrente, M.D.   ANESTHESIA:  Spinal.   COMPLICATIONS:  None.   ESTIMATED BLOOD LOSS:  Less than 500 mL.   SPECIMENS:  Placenta and cord blood.   FINDINGS:  1. Living female infant, Apgars 8 and 9 at 1 and 5 minutes; weight 6      pounds 14 ounces.  2. Anterior placenta with normal pelvic anatomy.   DETAILED PROCEDURE AND FINDINGS:  The risks, benefits and alternatives  of the surgery were explained; she understood and accepted.  We  discussed her incision; she had the previous vertical incision from  [place] and a transverse incision approximately 4 cm above the symphysis  pubis.  I offered to go through her previous transverse incision or make  her a new low transverse cesarean section much closer to the symphysis  pubis.  She opts to have her incision go through her previous transverse  incision.   Her spinal anesthesia was applied without complications.  She was placed  in the dorsal supine position with a left lateral tilt.  Her abdomen was  prepped and draped in the usual sterile fashion, as was her vagina.  A  Foley catheter was placed; it drained clear urine throughout the case.  Adequate anesthesia was assured.  A transverse incision was made at the  site of her previous transverse incision.  The incision was carried down  through the subcutaneous  tissue to the fascia.  The fascia was scored in  the midline; the fascial incision was extended bilaterally.  The middle  one-third of the rectus muscles were separated in transverse fashion.  The peritoneum was entered with hemostats.  Peritoneal incision was  extended bilaterally with the Bovie.  A bladder blade was placed.  The  transverse incision was made on the poorly developed lower uterine  segment.  The placenta was noted to be anterior.  The placenta ended  just below the incision; I was able to enter the amnion and there was  meconium fluid noted.  The baby's head was delivered.  Mouth and  nostrils were thoroughly suctioned prior to delivery of the shoulders.  The baby's cord was clamped and cut, and the baby was transferred to the  pediatrician.  The baby was already crying, once the head was coming out  of the incision.  Cord blood sample was obtained.  The placenta was  extracted and sent  to pathology.  The uterus was left in situ.  The  uterine interior was cleaned with a dry lap sponge.  No debris was left.  A bladder blade was placed and the uterine incision was closed with 2  layers of 0 chromic running, locking suture; the second layer  imbricating the first.  Excellent hemostasis was noted.  By tilting the  uterus I was able to visualize the adnexa on both sides; they were both  normal.  The uterine incision was again inspected and noted to be  hemostatic, as were the rectus muscles and rectus fascia.  The fascia  was closed with a 0 PDS in a running, nonlocking fashion.  No defects  were palpable.  At least 1 cm bite on each side of the fascial incision  were taken with the suturing.  A total of 30 mL of 0.5% Marcaine was  injected into the subcutaneous tissue for postoperative pain relief.  A  subcuticular closure was done with 3-0 Vicryl suture.  Steri-Strips were  placed.  The Foley catheter drained clear urine throughout.  The  instrument, sponge and needle counts  were correct.  She tolerated the  procedure well.      Allie Bossier, MD  Electronically Signed     MCD/MEDQ  D:  11/11/2007  T:  11/11/2007  Job:  [id number]

## 2011-03-15 NOTE — Discharge Summary (Signed)
NAMECHRISANDRA, WIEMERS              ACCOUNT NO.:  [id number]   MEDICAL RECORD NO.:  [id number]          PATIENT TYPE:  INP   LOCATION:                                [facility]   PHYSICIAN:  Lesly Dukes, M.D. DATE OF BIRTH:  [birth year]   DATE OF ADMISSION:  11/10/2007  DATE OF DISCHARGE:  11/13/2007                               DISCHARGE SUMMARY   REASON FOR ADMISSION:  Repeat low transverse cesarean section.   DISCHARGE DIAGNOSIS:  Term pregnancy delivered, postoperative day three.   DISCHARGE MEDICATIONS:  1. Percocet 5/325, 1-2 tabs p.o. q.4-6h. p.r.n. pain.  2. Motrin 600 mg p.o. q.4-6h. p.r.n. pain.  3. Colace 100 mg one tab p.o. b.i.d. p.r.n. constipation.  4. Prenatal vitamins one p.o. daily.  5. Iron sulfate 325 mg one p.o. daily.   PROCEDURE PERFORMED:  The patient had a low transverse cesarean section  performed November 10, 2007, please see dictated operative note for  further details.   LABORATORY DATA:  Blood type O positive, antibody negative, RPR  negative.  Hemoglobin postop November 11, 2007, 9.6, hematocrit 27.9, and  repeat on November 13, 2007, white blood cell count 9.6, hemoglobin 8.8,  hematocrit 26.3, and platelet count 235.  Her hepatitis B surface  antigen was negative and she was Rubella immune.   HOSPITAL COURSE:  Ms. Heidt is a 45 year old G5, P3-0-2-3, who was  admitted at 39 weeks for repeat low transverse cesarean section.  Her  prenatal care was at [facility].  She delivered a  viable female via low transverse cesarean section, Apgars 8 at 1 minute  and 9 at 5 minutes, weight 6 pounds, 14 ounces.  The placenta was  delivered with three vessel cord manually at the time of cesarean  section.  Estimated blood loss was less than 500 mL during delivery.  She was breast and bottle feeding, though primarily was bottle feeding.  She does not want any contraception at this point and has not used any  contraception in the  past.  She was still quite tender at the time of  discharge, therefore, we will get close follow up.   DISCHARGE INSTRUCTIONS:  She is to do no heavy lifting for six weeks.  Routine diet.  Medications as above.  She is to pat incision dry.  She  is to follow up on Thursday, November 15, 2007, at 3:15 p.m. at the  [facility] for a postpartum check given her tenderness.  She is also to follow up in six weeks at [facility]  Department for routine postpartum check.      Ancil Boozer, MD      Lesly Dukes, M.D.  Electronically Signed    SA/MEDQ  D:  11/13/2007  T:  11/13/2007  Job:  [id number]

## 2011-03-18 NOTE — Op Note (Signed)
[name], [name]              ACCOUNT NO.:  [id number]   MEDICAL RECORD NO.:  [id number]          PATIENT TYPE:  INP   LOCATION:  1516                         FACILITY:  [facility]   PHYSICIAN:  Sharlet Salina T. Hoxworth, M.D.DATE OF BIRTH:  April 13, 1966   DATE OF PROCEDURE:  12/06/2006  DATE OF DISCHARGE:  12/09/2007                               OPERATIVE REPORT   PRE AND POSTOPERATIVE DIAGNOSIS:  Cholelithiasis   SURGICAL PROCEDURES:  Laparoscopic cholecystectomy with intraoperative  cholangiogram.   SURGEON:  Sharlet Salina T. Hoxworth, M.D.   ASSISTANT:  Dr. Ovidio Kin.   ANESTHESIA:  General.   BRIEF HISTORY:  [name] is a 45 year old [place] female about one  month postpartum who presents with about a week of persistent episodic  epigastric and right upper quadrant pain radiating to her back.  Workup  had included a gallbladder ultrasound showing multiple small gallstones  and she has moderately elevated transaminases and alkaline phosphatase.  Common bile duct appears normal on ultrasound.  She is felt to have  symptomatic cholelithiasis.  We have recommended proceeding with  laparoscopic cholecystectomy with cholangiogram.  The indications for  the procedure, its nature, risks of bleeding, infection, bile leak, bile  duct injury, anesthetic complications were discussed and understood  preoperatively.  She is now brought to operating room for this  procedure.   DESCRIPTION OF OPERATION:  The patient brought to the operating room and  placed in the supine position on the operating table and general  endotracheal anesthesia was induced.  She received preoperative IV  antibiotics.  PAS were placed.  The abdomen was widely sterilely prepped  and draped.  Correct patient and procedure were verified.  Local  anesthesia was used to infiltrate the trocar sites prior to the  incisions.  A 1 cm incision was made at the umbilicus.  Dissection  carried down the midline fascia which  was sharply incised for 1 cm and  the peritoneum entered under direct vision.  Through a mattress suture  of 0-0 Vicryl the Hasson trocar was placed and pneumoperitoneum  established.  Under direct vision a 10 mm trocar was placed in  subxiphoid area, two 5 mm trocars on the right subcostal margin.  The  gallbladder did not appear acutely inflamed.  The fundus grasped and  elevated up over the liver and infundibulum retracted inferolaterally.  Peritoneum anterior and posterior to Calot's triangle was incised and  fibrofatty tissue was stripped off the neck of the gallbladder.  The  cystic duct was identified and the cystic duct gallbladder junction  dissected 360 degrees.  The cystic artery was clearly identified in  Calot's triangle.  At this point the cystic duct was clipped at the  gallbladder junction and an operative cholangiogram obtained through the  cystic duct.  This showed good filling of a normal common bile duct and  intrahepatic ducts with free flow into the duodenum and no filling  defects.  Following this, cholangiocath was removed and the cystic duct  was triply clipped proximally and divided.  The cystic artery was doubly  clipped proximally, clipped distally and divided.  The gallbladder was  then dissected free from its bed using hook cautery and removed through  the  umbilical incision.  Complete hemostasis was assured.  A CO2 evacuated  and trocars removed.  Mattress suture was secured at the umbilical  incision.  Skin was closed with subcuticular Monocryl and Dermabond.  Sponge, needle and instrument counts were correct.  The patient taken to  recovery in good condition.      Lorne Skeens. Hoxworth, M.D.  Electronically Signed     BTH/MEDQ  D:  12/07/2007  T:  12/10/2007  Job:  [id number]

## 2011-03-18 NOTE — Consult Note (Signed)
[name], [name] NO.:  [id number]   MEDICAL RECORD NO.:  [id number]                   PATIENT TYPE:  MAT   LOCATION:  MATC                                 [facility]ILITY:  [facility]   PHYSICIAN:  Charlynne Pander, D.D.S.          DATE OF BIRTH:  04/07/1967   DATE OF CONSULTATION:  06/05/2002  DATE OF DISCHARGE:                              DENTISTRY CONSULTATION   The patient is a 45 year old female formerly from the [place] who was referred  by Dr. Corky Sox for a dental consultation.  The patient is currently  followed in the high risk OB GYN clinic at [facility].  The patient  has a history of previous cesarean delivery in the [place] in 2000.  Dental  consultation requested to evaluate a history of an upper right molar  toothache.   MEDICAL HISTORY:  1. High risk pregnancy.     A. Followed by the high risk OB GYN clinic.     B. History of cesarean delivery in the [place] in 2000.     C. Currently [redacted] weeks gestation.  2. Anemia.  3. History of headaches.  4. Status post appendectomy in 1998.   ALLERGIES/DRUG REACTIONS:  None known.   MEDICATIONS:  1. Prenatal vitamin daily.  2. Tylenol as needed for pain.   SOCIAL HISTORY:  The patient is married.  The patient moved from the [place]  approximately four months ago.  The patient denies the use of alcohol or  other tobacco products.   FAMILY HISTORY:  Mother with a history of diabetes mellitus.  Father with a  history of nephrectomy for an unknown reason.   FUNCTIONAL ASSESSMENT:  The patient remains independent for ADLs.   REVIEW OF SYMPTOMS:  This is reviewed from the chart from the high risk OB  GYN clinic.   DENTAL HISTORY:  Chief complaint:  Dental consultation was requested for the  evaluation of an upper right molar toothache.   HISTORY OF PRESENT ILLNESS:  The patient currently denies toothache  symptoms, swellings or abscesses.  The patient had been complaining of a  upper  right molar toothache approximately two weeks ago.  The patient  indicates that this tooth no longer hurt.  The patient indicates that when  it hurt it was a sharp, intermittent pain which lasted hours at a time.  The  patient denies previous dental visits in the [place].  It is very  difficult to obtain dental history at this time due to significant language  barrier.  The patient knows minimal English and no interpreter is available.   DENTAL EXAM:  GENERAL:  The patient is a well-developed, well-nourished  Sud[place] in no acute distress.  The patient is obviously pregnant.  HEAD AND NECK:  There is no palpable lymphadenopathy.  There are no acute  TMJ symptoms.  VITAL SIGNS:  Blood pressure 104/70, pulse equal to 92, temperature 97.3  (  patient is afebrile).  INTRAORAL:  Patient with normal saliva.  There is no evidence of abscess  formation.  Tooth #1 is present in the mouth with significant dental caries  effecting mesial portion of the crown.  The tooth is not sensitive to  percussion or palpation at this time.  The patient is missing several teeth.  Patient with chronic periodontitis with plaque and calculus accumulations.   ASSESSMENTS:  1. History of acute pulpitis symptoms associated with tooth #1.  2. No obvious abscess formation or periapical radiolucency at this time.  3. Dental caries effecting tooth #1.  4. 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  5. Multiple missing teeth.  6. Need for comprehensive dental care after the delivery of her child.   PLAN/RECOMMENDATIONS:  1. I discussed the risks, benefits and complications of various treatment     options with the patient in relationship to her medical and dental     conditions, the risks for her and her fetus at this time, and the risk     for infection.  We discussed no treatment, extraction of tooth #1 with     alveloplasty as indicated, referral to an oral surgeon, and followup      comprehensive dental care as indicated.  After discussion of various     risks the patient wishes to defer all treatment at this time.  She will     let dental medicine clinic or the high risk OB GYN clinic know if acute     toothache symptoms arise again.  At that time, the patient will be     referred to an oral surgeon for the extraction due to the complexity of     that extraction.  2. Discussion of the findings with the high risk OB GYN clinic and Dr.     Gavin Potters as indicated.                                               Charlynne Pander, D.D.S.    RFK/MEDQ  D:  06/06/2002  T:  06/11/2002  Job:  [id number]   cc:   Conni Elliot, M.D.   High Risk OB/GYN Outpatient Clinic   Dental Medicine Clinic

## 2011-03-18 NOTE — Op Note (Signed)
   NAMECRYSTALEE, [name] NO.:  [id number]   MEDICAL RECORD NO.:  [id number]                   PATIENT TYPE:  INP   LOCATION:  9126                                 [facility]ILITY:  [facility]   PHYSICIAN:  Conni Elliot, M.D.             DATE OF BIRTH:  04/07/1967   DATE OF PROCEDURE:  09/13/2002  DATE OF DISCHARGE:  09/21/2002                                 OPERATIVE REPORT   PREOPERATIVE DIAGNOSIS:  Prior cesarean delivery and patient requests  repeat.   PROCEDURE:  Low transverse cesarean.   SURGEON:  Conni Elliot, M.D.   ANESTHESIA:  General orotracheal.   OPERATIVE FINDINGS:  A 6 pound 7 ounce female with Apgars of 9 and 9.   DESCRIPTION OF PROCEDURE:  The patient was placed under general endotracheal  anesthesia and was prepped and draped in a sterile fashion and was entered  through a low transverse Pfannenstiel incision.  The incision was made  through the fascia, rectus muscles separated in the midline, the peritoneum  entered, and a bladder flap created.  A low transverse uterine incision was  made.  Baby delivered from the vertex presentation.  The cord double  clamped.  The placenta delivered spontaneously.  The uterus, bladder flap,  anterior peritoneum, fascia, and skin were closed in standard fashion.  Estimated blood loss less than 800 cc.                                               Conni Elliot, M.D.    ASG/MEDQ  D:  10/23/2002  T:  10/25/2002  Job:  [id number]

## 2011-03-18 NOTE — Op Note (Signed)
[facility]  Patient:    [name], [name] Visit Number: [id number] MRN: [id number]          Service Type: EMS Location: [facility] Attending Physician:  Pearletha Alfred Dictated by:   Duke Salvia. Marcelle Overlie, M.D. Proc. Date: 01/19/02 Admit Date:  01/18/2002 Discharge Date: 01/18/2002                             Operative Report  PREOPERATIVE DIAGNOSES:       Acute abdominal pain, rule out ectopic pregnancy.  POSTOPERATIVE DIAGNOSES:      Pelvic adhesions, normal fallopian tubes, no evidence of hemoperitoneum.  PROCEDURE:                    Open diagnostic laparoscopy.  SURGEON:                      Duke Salvia. Marcelle Overlie, M.D.  ANESTHESIA:                   General endotracheal.  COMPLICATIONS:                None.  DRAINS:                       In-and-out Foley catheter.  ESTIMATED BLOOD LOSS:         Less than 5 cc.  PROCEDURE AND FINDINGS:       The patient was taken to the operating room. After an adequate level of general endotracheal anesthesia was obtained with the patients legs in stirrups the abdomen, perineum, and vagina were prepped and draped in the usual manner for laparoscopy.  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  The bladder was drained.  The speculum was placed.  Cervix was identified.  Hulka tenaculum was positioned. Attention directed to the abdomen where a 2 cm subumbilical incision was made. The [employer] retractors were then used to retract the incision.  The fascia was elevated, incised, and the peritoneum entered individually and separately without difficulty.  There was no evidence of any periumbilical adhesions. The Hasson cannula was positioned.  Pneumoperitoneum was then created and the patient placed in Trendelenburg.  Initial laparoscopy revealed some anterior abdominal wall omental adhesions.  The scope could be passed around the adhesions to view the pelvis.  Three fingerbreadths above the  symphysis in the midline a second 5 mm trocar was positioned under direct visualization with the patient in Trendelenburg.  The bladder flap area was inspected and noted to be normal.  The uterus itself was normal.  The cul-de-sac was free and clear.  There was no hemoperitoneum.  Both tubes were able to be traced from cornu to fimbriated end.  Both appeared to be normal.  Fimbriated end of both tubes was unremarkable.  After these findings were noted, instruments were removed, gas allowed to escape.  Defects closed with a 2-0 Dexon on the fascia, subumbilical with 4-0 Dexon on the skin and Dermabond.  She went to recovery room in good condition. Dictated by:   Duke Salvia. Marcelle Overlie, M.D. Attending Physician:  Pearletha Alfred DD:  01/19/02 TD:  01/21/02 Job: [phone] [id number]

## 2011-03-18 NOTE — Op Note (Signed)
[name], [name] NO.:  [id number]   MEDICAL RECORD NO.:  [id number]          PATIENT TYPE:  MAT   LOCATION:  [facility]C                          [facility]ILITY:  [facility]   PHYSICIAN:  Osborn Coho, M.D.   DATE OF BIRTH:  Mar 10, 1966   DATE OF PROCEDURE:  01/24/2006  DATE OF DISCHARGE:                                 OPERATIVE REPORT   PREOPERATIVE DIAGNOSIS:  1.  Abnormal pregnancy.  2.  Evaluation for ectopic pregnancy.   POSTOPERATIVE DIAGNOSIS:  Abnormal intrauterine pregnancy.   PROCEDURE:  Suction dilation and curettage.   ANESTHESIA:  General.   ATTENDING:  Osborn Coho, M.D.   FLUIDS:  650 mL.   URINE OUTPUT:  Approximately 100 mL via straight cath prior to procedure.   ESTIMATED BLOOD LOSS:  Minimal.   COMPLICATIONS:  None.   PATHOLOGY:  Products of conception sent with confirmation of POC by frozen  section in OR.   PROCEDURE:  The patient is taken to the operating room after risks,  benefits, alternatives reviewed with the patient.  The patient verbalized  understanding and consent signed and witnessed. Translation via father of  baby or husband.  The patient was prepped and draped in the normal sterile  fashion in the dorsolithotomy position.  A bivalve speculum was placed in  the patient's vagina and cervix grasped with a single-tooth tenaculum on the  anterior lip.  Cervix was dilated for passage of a size 7 suction curette  after sounding the uterus to 9 cm.  Suction curettage was performed and  sharp curettage performed until a gritty texture noted.  Suction curettage  was performed once again to remove the remaining debris.  Count was correct.  The patient tolerated procedure well and is currently awaiting extubation.      Osborn Coho, M.D.  Electronically Signed     AR/MEDQ  D:  01/24/2006  T:  01/26/2006  Job:  [id number]

## 2011-03-18 NOTE — H&P (Signed)
[facility]  Patient:    [name], [name] Visit Number: [id number] MRN: [id number]          Service Type: OBS Location: [facility] Attending Physician:  [name]. Dictated by:   [name], M.D. Admit Date:  01/18/2002                           History and Physical  HISTORY OF PRESENT ILLNESS:   This patient is a 45 year old [place] female G3 P1-0-1-1, was seen by the nurse practitioner in triage earlier this evening complaining of suprapubic pain and hematuria.  She was seen at Urgent Care yesterday with a positive UPT, presented to triage this evening complaining of some bleeding with urination along with lower abdominal pain.  Initially the nurse practitioner evaluation showed a hemoglobin of 11.8, ultrasound that showed concern about a bladder tumor - perhaps schistosomiasis, or  ______ cystitis.  No IUP was noted.  QHCG was 1854.  The initial plan per the nurse practitioner was to repeat her quant in 48 hours and follow, but due to a syncopal episode when the patient hit her head, she was being transferred to [facility] for CT of the head evaluation.  I was called at that time and advised of the situation with her unusual bladder findings and had asked the ED physician at [facility] to consult urology when she presented there to have her head CT.  On arrival there, the emergency room M.D. called me because he was very concerned about her abdominal findings being suspicious for ectopic pregnancy. She was stable throughout again in transfer there, and he felt that the CT of her head could wait until her abdominal findings were evaluated.  [name] was called and again felt that the need to rule out ectopic pregnancy superseded concerns about the bladder findings.  She was transferred back here and presents now for a laparoscopy.  She has had a previous cesarean section. We discussed the procedure of open diagnostic laparoscopy with possible  need for salpingectomy or laparotomy.  Please see the remainder of the initial H&P including physical exam per the nurse practitioner evaluation on admission. Dictated by:   [name], M.D. Attending Physician:  [name]. DD:  01/19/02 TD:  01/19/02 Job: [id number]

## 2011-05-02 ENCOUNTER — Encounter: Payer: Self-pay | Admitting: Internal Medicine

## 2011-07-20 LAB — CBC
HCT: 27.9 — ABNORMAL LOW
Hemoglobin: 10.3 — ABNORMAL LOW
Hemoglobin: 11.3 — ABNORMAL LOW
Hemoglobin: 8.8 — ABNORMAL LOW
Hemoglobin: 9.6 — ABNORMAL LOW
MCHC: 33.5
MCHC: 34
MCHC: 34.3
MCHC: 34.7
MCV: 83.7
MCV: 84.1
RBC: 3.12 — ABNORMAL LOW
RBC: 3.32 — ABNORMAL LOW
RBC: 3.61 — ABNORMAL LOW
RBC: 3.96
RDW: 14.6
RDW: 15
WBC: 6.7
WBC: 9.8

## 2011-07-20 LAB — URINALYSIS, ROUTINE W REFLEX MICROSCOPIC
Bilirubin Urine: NEGATIVE
Hgb urine dipstick: NEGATIVE
Nitrite: NEGATIVE
Specific Gravity, Urine: 1.01
pH: 6.5

## 2011-07-20 LAB — RPR: RPR Ser Ql: NONREACTIVE

## 2011-07-21 LAB — CBC
HCT: 25.9 — ABNORMAL LOW
HCT: 27 — ABNORMAL LOW
Hemoglobin: 8.8 — ABNORMAL LOW
Hemoglobin: 9.1 — ABNORMAL LOW
MCHC: 33.8
MCV: 83.3
Platelets: 305
RBC: 3.23 — ABNORMAL LOW
RDW: 15.1
RDW: 15.6 — ABNORMAL HIGH

## 2011-07-21 LAB — URINALYSIS, ROUTINE W REFLEX MICROSCOPIC
Glucose, UA: NEGATIVE
Hgb urine dipstick: NEGATIVE
Ketones, ur: NEGATIVE
Protein, ur: NEGATIVE
Urobilinogen, UA: 0.2

## 2011-07-21 LAB — BASIC METABOLIC PANEL
BUN: 10
CO2: 22
Chloride: 108
Glucose, Bld: 81
Potassium: 4
Sodium: 138

## 2011-07-21 LAB — URINE CULTURE: Culture: NO GROWTH

## 2011-07-22 LAB — HEPATIC FUNCTION PANEL
Indirect Bilirubin: 0.8
Total Protein: 8.1

## 2011-07-22 LAB — COMPREHENSIVE METABOLIC PANEL
ALT: 422 — ABNORMAL HIGH
Alkaline Phosphatase: 233 — ABNORMAL HIGH
CO2: 25
Chloride: 108
GFR calc non Af Amer: >60
Glucose, Bld: 103 — ABNORMAL HIGH
Potassium: 3.8
Sodium: 140
Total Protein: 7.3

## 2011-07-22 LAB — DIFFERENTIAL
Basophils Absolute: 0
Basophils Relative: 0
Lymphocytes Relative: 20
Monocytes Absolute: 0.6
Neutro Abs: 4.5
Neutrophils Relative %: 68

## 2011-07-22 LAB — I-STAT 8, (EC8 V) (CONVERTED LAB)
BUN: 9
Bicarbonate: 24
HCT: 44
Operator id: 294501
TCO2: 25
pCO2, Ven: 41.5 — ABNORMAL LOW

## 2011-07-22 LAB — POCT I-STAT CREATININE: Operator id: 294501

## 2011-07-22 LAB — CBC
Hemoglobin: 12.8
Hemoglobin: 11.6 — ABNORMAL LOW
MCHC: 32.7
Platelets: 359
RBC: 4.31
RDW: 15.8 — ABNORMAL HIGH
WBC: 4.6

## 2011-07-22 LAB — URINALYSIS, ROUTINE W REFLEX MICROSCOPIC
Glucose, UA: NEGATIVE
Hgb urine dipstick: NEGATIVE
Specific Gravity, Urine: 1.033 — ABNORMAL HIGH

## 2011-07-22 LAB — URINE MICROSCOPIC-ADD ON

## 2011-07-22 LAB — LIPASE, BLOOD: Lipase: 48

## 2011-08-05 LAB — CBC
MCV: 84.3
Platelets: 281
WBC: 7

## 2011-08-05 LAB — URINALYSIS, ROUTINE W REFLEX MICROSCOPIC
Ketones, ur: NEGATIVE
Nitrite: NEGATIVE
pH: 6.5

## 2011-08-16 LAB — URINALYSIS, ROUTINE W REFLEX MICROSCOPIC
Bilirubin Urine: NEGATIVE
Ketones, ur: 15 — AB
Nitrite: NEGATIVE
Protein, ur: NEGATIVE
Specific Gravity, Urine: >1.03 — ABNORMAL HIGH
Urobilinogen, UA: 0.2

## 2011-08-16 LAB — URINE MICROSCOPIC-ADD ON

## 2011-08-17 LAB — URINALYSIS, ROUTINE W REFLEX MICROSCOPIC
Bilirubin Urine: NEGATIVE
Glucose, UA: NEGATIVE
Specific Gravity, Urine: 1.02
pH: 6.5

## 2011-08-17 LAB — URINE MICROSCOPIC-ADD ON

## 2011-08-18 LAB — URINALYSIS, ROUTINE W REFLEX MICROSCOPIC
Bilirubin Urine: NEGATIVE
Glucose, UA: NEGATIVE
Hgb urine dipstick: NEGATIVE
Nitrite: NEGATIVE
Specific Gravity, Urine: 1.025
pH: 6.5

## 2012-01-17 ENCOUNTER — Encounter (HOSPITAL_COMMUNITY): Payer: Self-pay | Admitting: *Deleted

## 2012-01-17 ENCOUNTER — Emergency Department (INDEPENDENT_AMBULATORY_CARE_PROVIDER_SITE_OTHER)
Admission: EM | Admit: 2012-01-17 | Discharge: 2012-01-17 | Disposition: A | Discharge status: Home Health | Payer: Self-pay | Source: Home / Self Care

## 2012-01-17 DIAGNOSIS — J069 Acute upper respiratory infection, unspecified: Secondary | ICD-10-CM

## 2012-01-17 DIAGNOSIS — B309 Viral conjunctivitis, unspecified: Secondary | ICD-10-CM

## 2012-01-17 MED ORDER — LORATADINE 10 MG PO TABS: 10.0000 mg | ORAL_TABLET | Freq: Every day | ORAL | Status: DC

## 2012-01-17 NOTE — ED Provider Notes (Signed)
History     CSN: [id number]  Arrival date & time 01/17/12  1524   None     Chief Complaint  Patient presents with  . Eye Pain    (Consider location/radiation/quality/duration/timing/severity/associated sxs/prior treatment) HPI Comments: Patient states that she has had nasal congestion, sneezing sore throat and headache for the last 2 days. Yesterday she developed some redness and watering in her left eye. She states that she occasionally has some itching in the corner of her left eye but no eye pain. She has some mattering of her left eyelashes when she awoke this morning. No visual changes. She denies a history of seasonal allergies, she has lived in the [place] for the last 10 years. No fever or cough.   Past Medical History  Diagnosis Date  . Somatic complaints, multiple   . Depressed   . Abnormal abdominal CT scan 01/20/2005    and pelvic CT, normal   . Abdominal ultrasound, abnormal     normal  . History of pelvic ultrasound 12/06    normal   . History of barium enema     normal    Past Surgical History  Procedure Date  . Appendectomy   . Caesarean section     x2  . Laparoscopic cholecystectomy 2009  . Cholecystectomy     Family History  Problem Relation Age of Onset  . Diabetes Mother   . Hypertension Mother   . Throat cancer Father     non-smoker    History  Substance Use Topics  . Smoking status: Never Smoker   . Smokeless tobacco: Not on file  . Alcohol Use: No    OB History    Grav Para Term Preterm Abortions TAB SAB Ect Mult Living                  Review of Systems  Constitutional: Negative for fever and chills.  HENT: Positive for congestion, sore throat and rhinorrhea. Negative for ear pain and sinus pressure.   Eyes: Positive for redness and itching. Negative for pain.  Respiratory: Negative for cough, shortness of breath and wheezing.   Cardiovascular: Negative for chest pain.  Neurological: Positive for headaches.    Allergies  Review  of patient's allergies indicates no known allergies.  Home Medications   Current Outpatient Rx  Name Route Sig Dispense Refill  . LORATADINE 10 MG PO TABS Oral Take 1 tablet (10 mg total) by mouth daily. 10 tablet 0    BP 168/93  Pulse 80  Temp(Src) 98.5 F (36.9 C) (Oral)  Resp 16  SpO2 100%  LMP 01/09/2012  Physical Exam  Nursing note and vitals reviewed. Constitutional: She appears well-developed and well-nourished. No distress.  HENT:  Head: Normocephalic and atraumatic.  Right Ear: Tympanic membrane, external ear and ear canal normal.  Left Ear: Tympanic membrane, external ear and ear canal normal.  Nose: Mucosal edema (mild) and rhinorrhea (clear) present.  Mouth/Throat: Uvula is midline, oropharynx is clear and moist and mucous membranes are normal. No oropharyngeal exudate, posterior oropharyngeal edema or posterior oropharyngeal erythema.  Eyes: EOM and lids are normal. Pupils are equal, round, and reactive to light. Right conjunctiva is not injected. Left conjunctiva is injected.  Neck: Neck supple.  Cardiovascular: Normal rate, regular rhythm and normal heart sounds.   Pulmonary/Chest: Effort normal and breath sounds normal. No respiratory distress.  Lymphadenopathy:    She has no cervical adenopathy.  Neurological: She is alert.  Skin: Skin is warm and dry.  Psychiatric: She has a normal mood and affect.    ED Course  Procedures (including critical care time)  Labs Reviewed - No data to display No results found.   1. Acute URI   2. Viral conjunctivitis       MDM          Melody Comas, PA 01/17/12 1818

## 2012-01-17 NOTE — ED Notes (Signed)
2 days ago pt had sore throat and headache.  Yesterday she noticed redness and watering of her left eye.

## 2012-01-17 NOTE — Discharge Instructions (Signed)
Use cool compresses to eye. Avoid rubbing eyes. Conjunctivitis is spread by touch. Wash your hands frequently, especially after touching your eyes to avoid spreading to others. Use artificial tears eye drops in your left eye 3-4 times a day, or more often as needed. Tylenol or Ibuprofen as needed for discomfort.

## 2012-01-18 NOTE — ED Provider Notes (Signed)
Medical screening examination/treatment/procedure(s) were performed by non-physician practitioner and as supervising physician I was immediately available for consultation/collaboration.   MORENO-COLL,Corrissa Martello; MD   Phyliss Hulick Moreno-Coll, MD 01/18/12 2149 

## 2012-07-19 ENCOUNTER — Encounter (HOSPITAL_COMMUNITY): Payer: Self-pay

## 2012-07-19 ENCOUNTER — Emergency Department (HOSPITAL_COMMUNITY)
Admission: EM | Admit: 2012-07-19 | Discharge: 2012-07-19 | Disposition: A | Discharge status: Home / Self Care | Payer: Self-pay | Source: Home / Self Care | Attending: Emergency Medicine | Admitting: Emergency Medicine

## 2012-07-19 DIAGNOSIS — J069 Acute upper respiratory infection, unspecified: Secondary | ICD-10-CM

## 2012-07-19 MED ORDER — SALINE NASAL SPRAY 0.65 % NA SOLN: 1.0000 | NASAL | Status: DC | PRN

## 2012-07-19 MED ORDER — AZITHROMYCIN 250 MG PO TABS: ORAL_TABLET | ORAL | Status: DC

## 2012-07-19 MED ORDER — CETIRIZINE-PSEUDOEPHEDRINE ER 5-120 MG PO TB12: 1.0000 | ORAL_TABLET | Freq: Two times a day (BID) | ORAL | Status: DC

## 2012-07-19 NOTE — ED Provider Notes (Signed)
Medical screening examination/treatment/procedure(s) were performed by non-physician practitioner and as supervising physician I was immediately available for consultation/collaboration.  Leslee Home, M.D.   Reuben Likes, MD 07/19/12 2225

## 2012-07-19 NOTE — ED Provider Notes (Signed)
History     CSN: [id number]  Arrival date & time 07/19/12  6213   None     Chief Complaint  Patient presents with  . URI  . Otalgia  . Sore Throat    (Consider location/radiation/quality/duration/timing/severity/associated sxs/prior treatment) Patient is a 46 y.o. female presenting with URI, ear pain, and pharyngitis. The history is provided by the patient.  URI The primary symptoms include ear pain, sore throat, cough and wheezing.  Symptoms associated with the illness include sinus pressure, congestion and rhinorrhea.  Otalgia Associated symptoms include rhinorrhea, sore throat and cough.  Sore Throat  [name] is a 47 y.o. female who complains of onset of sore throat for 3 weeks with throat itching and left ear pain.   Has not taken medications for symptoms. + sore throat + cough, non productive No pleuritic pain + wheezing + nasal congestion + post-nasal drainage + sinus pain/pressure No voice changes No chest congestion No itchy/red eyes L earache No hemoptysis No SOB + chills/sweats No fever No nausea No vomiting No abdominal pain No diarrhea No skin rashes No fatigue No myalgias + headache  No ill contacts   Past Medical History  Diagnosis Date  . Somatic complaints, multiple   . Depressed   . Abnormal abdominal CT scan 01/20/2005    and pelvic CT, normal   . Abdominal ultrasound, abnormal     normal  . History of pelvic ultrasound 12/06    normal   . History of barium enema     normal    Past Surgical History  Procedure Date  . Appendectomy   . Caesarean section     x2  . Laparoscopic cholecystectomy 2009  . Cholecystectomy     Family History  Problem Relation Age of Onset  . Diabetes Mother   . Hypertension Mother   . Throat cancer Father     non-smoker    History  Substance Use Topics  . Smoking status: Never Smoker   . Smokeless tobacco: Not on file  . Alcohol Use: No    OB History    Grav Para Term Preterm Abortions  TAB SAB Ect Mult Living                  Review of Systems  Constitutional: Negative.   HENT: Positive for ear pain, congestion, sore throat, rhinorrhea and sinus pressure.   Respiratory: Positive for cough and wheezing.   Cardiovascular: Negative.   Skin: Negative.     Allergies  Review of patient's allergies indicates no known allergies.  Home Medications   Current Outpatient Rx  Name Route Sig Dispense Refill  . AZITHROMYCIN 250 MG PO TABS  Azithromycin 500mg  on day 1, then 250mg  on days 2-4 6 tablet 0  . CETIRIZINE-PSEUDOEPHEDRINE ER 5-120 MG PO TB12 Oral Take 1 tablet by mouth 2 (two) times daily. 30 tablet 0  . LORATADINE 10 MG PO TABS Oral Take 1 tablet (10 mg total) by mouth daily. 10 tablet 0  . SALINE NASAL SPRAY 0.65 % NA SOLN Nasal Place 1 spray into the nose as needed for congestion. 30 mL 12    BP 139/80  Pulse 80  Temp 97.8 F (36.6 C) (Oral)  Resp 18  SpO2 100%  LMP 07/17/2012  Physical Exam  Nursing note and vitals reviewed. Constitutional: She is oriented to person, place, and time. Vital signs are normal. She appears well-developed and well-nourished. She is active and cooperative.  HENT:  Head: Normocephalic.  Right  Ear: Hearing, tympanic membrane, external ear and ear canal normal.  Left Ear: Hearing, tympanic membrane, external ear and ear canal normal.  Nose: Mucosal edema and rhinorrhea present. Right sinus exhibits maxillary sinus tenderness. Left sinus exhibits maxillary sinus tenderness.  Mouth/Throat: Uvula is midline, oropharynx is clear and moist and mucous membranes are normal.  Eyes: Conjunctivae normal and EOM are normal. Pupils are equal, round, and reactive to light. No scleral icterus.  Neck: Trachea normal and normal range of motion. Neck supple.  Cardiovascular: Normal rate, regular rhythm, normal heart sounds and normal pulses.   Pulmonary/Chest: Effort normal and breath sounds normal. She exhibits tenderness.  Neurological: She is  alert and oriented to person, place, and time. No cranial nerve deficit or sensory deficit.  Skin: Skin is warm and dry.  Psychiatric: She has a normal mood and affect. Her speech is normal and behavior is normal. Judgment and thought content normal. Cognition and memory are normal.    ED Course  Procedures (including critical care time)  Labs Reviewed - No data to display No results found.   1. URI (upper respiratory infection)       MDM  Increase fluid intake, rest.  Begin Azithromycin.  Begin saline nasal spray.  Antihistamines of your choice (Claritin or Zyrtec).  Tylenol or Motrin for fever/discomfort.  Followup with PCP if not improving 7 to 10 days.         Johnsie Kindred, NP 07/19/12 1044

## 2012-07-19 NOTE — ED Notes (Signed)
C/o fever, sore throat, bil. Ear pain with decreased hearing (esp lt ear), head and chest congestion, and non productive cough.  States it felt like it was hard to breathe last night.  Sx for 2 days.

## 2012-09-04 ENCOUNTER — Ambulatory Visit (INDEPENDENT_AMBULATORY_CARE_PROVIDER_SITE_OTHER): Payer: Self-pay | Admitting: Family Medicine

## 2012-09-04 ENCOUNTER — Ambulatory Visit (HOSPITAL_COMMUNITY)
Admission: RE | Admit: 2012-09-04 | Discharge: 2012-09-04 | Disposition: A | Discharge status: Home / Self Care | Payer: Self-pay | Source: Ambulatory Visit | Attending: Internal Medicine | Admitting: Internal Medicine

## 2012-09-04 ENCOUNTER — Encounter: Payer: Self-pay | Admitting: Family Medicine

## 2012-09-04 VITALS — BP 144/75 | HR 77 | Temp 98.2°F | Ht 63.0 in | Wt 147.0 lb

## 2012-09-04 DIAGNOSIS — M25519 Pain in unspecified shoulder: Secondary | ICD-10-CM

## 2012-09-04 DIAGNOSIS — I456 Pre-excitation syndrome: Secondary | ICD-10-CM

## 2012-09-04 DIAGNOSIS — M25512 Pain in left shoulder: Secondary | ICD-10-CM

## 2012-09-04 DIAGNOSIS — R079 Chest pain, unspecified: Secondary | ICD-10-CM | POA: Insufficient documentation

## 2012-09-04 HISTORY — DX: Pain in left shoulder: M25.512

## 2012-09-04 MED ORDER — IBUPROFEN 600 MG PO TABS: 600.0000 mg | ORAL_TABLET | Freq: Three times a day (TID) | ORAL | Status: DC | PRN

## 2012-09-04 NOTE — Assessment & Plan Note (Addendum)
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 Pt asymptomatic. Plan: Discussed in depth this condition and signs/symptoms that will require prompt evaluation. Pt agreeable to consult with cardiology once she is accepted  by [facility] since she has financial restraints at this moment Today Systolic BP 144. Pt will keep a log and reevaluate in a month. If pt has HTN with this condition [facility] would be first choice. CBC, CMET , TSH, Lipid Profile for risk stratification. Will leave as open orders since pt is applying at this time for orange card.

## 2012-09-04 NOTE — Patient Instructions (Addendum)
You have a condition that is called [employer] Syndrome. It looks stable at this point on EKG. You need to get evaluated if you develop new onset palpitations (heart racing) or chest pain. Before you iniciate any new exercise program you also need to be evaluated. Please keep a log of your Blood pressure and follow up with [place] in a month. I have also ordered labs as future orders. Make a lab appointment for this. I will call you if there results are abnormal otherwise we will discuss them during your next visit. Also make an appointment for pap smear. For your shoulder pain you can take the medication prescribed Q8h for 2-3 days then as needed for pain. Do not exceed of 7-10 days of treatment.

## 2012-09-04 NOTE — Progress Notes (Signed)
Family Medicine Office Visit Note   Subjective:   Patient ID: [name], female  DOB: [birth year], 46 y.o.. MRN: [id number]   Pt that comes today as new pt. She complains of mild intermittent pain on her left shoulder, elbow and wrist with no joint swelling or redness. No history of trauma or specific event that brings pain. Ibuprofen and rest helps. No other joint involvement.   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 Pt was not very educated on her condition.  Hx of depression SIGE-CAPS asked and negative answered.   Pt reports LMP 10/14 and regular. Last pap smear 4-5 years ago. Does not use birth control due to religious believes.   Review of Systems:  Pt denies SOB, chest pain, palpitations, headaches, dizziness, numbness or weakness. No changes on urinary or BM habits. No unintentional weigh loss/gain.  Objective:   Physical Exam: Gen:  NAD HEENT: Moist mucous membranes. Neck normal Thyroid to palpation CV: Regular rate and rhythm, systolic murmur III/VII.  PULM: Clear to auscultation bilaterally. ABD: Soft, non tender, non distended, normal bowel sounds EXT: No edema  MSK. Normal ROM of joints, no effusion, no erythema, no tender to palpation or maneuvers. Neuro: Alert and oriented x3. No focalization  Assessment & Plan:

## 2012-09-04 NOTE — Assessment & Plan Note (Signed)
Most likely musculoskeletal origin.  Plan: Ibuprofen scheduled for 2-3 days then PRN. F/u if no improvement.

## 2012-12-06 ENCOUNTER — Encounter: Payer: Self-pay | Admitting: Family Medicine

## 2012-12-06 ENCOUNTER — Ambulatory Visit (INDEPENDENT_AMBULATORY_CARE_PROVIDER_SITE_OTHER): Payer: No Typology Code available for payment source | Admitting: Family Medicine

## 2012-12-06 VITALS — BP 139/81 | HR 90 | Temp 97.8°F | Wt 147.8 lb

## 2012-12-06 DIAGNOSIS — R1030 Lower abdominal pain, unspecified: Secondary | ICD-10-CM

## 2012-12-06 DIAGNOSIS — I456 Pre-excitation syndrome: Secondary | ICD-10-CM

## 2012-12-06 DIAGNOSIS — R109 Unspecified abdominal pain: Secondary | ICD-10-CM

## 2012-12-06 LAB — CBC
MCH: 29.6 pg (ref 26.0–34.0)
MCHC: 34.6 g/dL (ref 30.0–36.0)
Platelets: 302 10*3/uL (ref 150–400)
RDW: 13.8 % (ref 11.5–15.5)

## 2012-12-06 LAB — BASIC METABOLIC PANEL
Calcium: 9.6 mg/dL (ref 8.4–10.5)
Glucose, Bld: 144 mg/dL — ABNORMAL HIGH (ref 70–99)
Sodium: 139 mEq/L (ref 135–145)

## 2012-12-06 NOTE — Patient Instructions (Addendum)
In order to assess your condition a vaginal exam should be performed. Please make an appointment for this. Labs will be drawn today and I will call you if they come back abnormal if not we will discuss them during your next appointment An order of analysis of the urine has been placed please come to get a sample when your period has passed.  This is something that can explain your symptoms but it is recommended  to assess it further as the only diagnosis is through laparoscopy.   Endometriosis Endometriosis is a disease that occurs when the endometrium (lining of the uterus) is misplaced outside of its normal location. It may occur in many locations close to the uterus (womb), but commonly on the ovaries, fallopian tubes, vagina (birth canal) and bowel located close to the uterus. Because the uterus sloughs (expels) its lining every month (menses), there is bleeding whereever the endometrial tissue is located. SYMPTOMS  Often there are no symptoms. However, because blood is irritating to tissues not normally exposed to it, when symptoms occur they vary with the location of the misplaced endometrium. Symptoms often include back and abdominal pain. Periods may be heavier and intercourse may be painful. Infertility may be present. You may have all of these symptoms at one time or another or you may have months with no symptoms at all. Although the symptoms occur mainly during menses, they can occur mid-cycle as well, and usually terminate with menopause. DIAGNOSIS  Your caregiver may recommend a blood test and urine test (urinalysis) to help rule out other conditions. Another common test is ultrasound, a painless procedure that uses sound waves to make a sonogram "picture" of abnormal tissue that could be endometriosis. If your bowel movements are painful around your periods, your caregiver may advise a barium enema (an X-ray of the lower bowel), to try to find the source of your pain. This is sometimes  confirmed by laparoscopy. Laparoscopy is a procedure where your caregiver looks into your abdomen with a laparoscope (a small pencil sized telescope). Your caregiver may take a tiny piece of tissue (biopsy) from any abnormal tissue to confirm or document your problem. These tissues are sent to the lab and a pathologist looks at them under the microscope to give a microscopic diagnosis. TREATMENT  Once the diagnosis is made, it can be treated by destruction of the misplaced endometrial tissue using heat (diathermy), laser, cutting (excision), or chemical means. It may also be treated with hormonal therapy. When using hormonal therapy menses are eliminated, therefore eliminating the monthly exposure to blood by the misplaced endometrial tissue. Only in severe cases is it necessary to perform a hysterectomy with removal of the tubes, uterus and ovaries. HOME CARE INSTRUCTIONS   Only take over-the-counter or prescription medicines for pain, discomfort, or fever as directed by your caregiver.  Avoid activities that produce pain, including a physical sexual relationship.  Do not take aspirin as this may increase bleeding when not on hormonal therapy.  See your caregiver for pain or problems not controlled with treatment. SEEK IMMEDIATE MEDICAL CARE IF:   Your pain is severe and is not responding to pain medicine.  You develop severe nausea and vomiting, or you cannot keep foods down.  Your pain localizes to the right lower part of your abdomen (possible appendicitis).  You have swelling or increasing pain in the abdomen.  You have a fever.  You see blood in your stool. MAKE SURE YOU:   Understand these instructions.  Will  watch your condition.  Will get help right away if you are not doing well or get worse. Document Released: 10/14/2000 Document Revised: 01/09/2012 Document Reviewed: 06/04/2008 [facility] Patient Information 2013 [place], [place].

## 2012-12-07 DIAGNOSIS — R1030 Lower abdominal pain, unspecified: Secondary | ICD-10-CM

## 2012-12-07 HISTORY — DX: Lower abdominal pain, unspecified: R10.30

## 2012-12-07 NOTE — Progress Notes (Signed)
Family Medicine Office Visit Note   Subjective:   Patient ID: [name], female  DOB: 13-May-1966, 47 y.o.. MRN: [id number]   Pt that comes today complaining of intermittent abdominal pain related to her menses. This has been happening to her for the last 3 years usually coinciding with her periods. Menses are described as regular every month and lasting for 7-8 days with no change on flow.   Pt reports hx of ectopic pregnancy diagnosed with laparoscopy in 2007  The pain is located diffusely on lower abdomen but more on right side with no irradiation.  It is described as cramp-like  with Intensity of 2-3/10.  Pain alleviates with tylenol, no associated symptoms. Pt denies: nausea, vomiting diarrhea, fever.  Hx WPW: Denies symptoms, follows with Dr. Ladona Ridgel at [facility]. Last visit was 2012. Instructed that if pt develops palpitations, then catheter ablation would be recommended.  Review of Systems:  Pt denies SOB, chest pain, palpitations, headaches, dizziness, numbness or weakness. No changes on urinary or BM habits. No unintentional weigh loss/gain.  Objective:   Physical Exam: Gen:  NAD HEENT: Moist mucous membranes  CV: Regular rate and rhythm, no murmurs rubs or gallops PULM: Clear to auscultation bilaterally. No wheezes/rales/rhonchi ABD: Non distended. soft, mildly tender to deep palpation on lower abdomen. No rebound tenderness. No guarding. No signs of acute abdomen. Normal BS. EXT: No edema Neuro: Alert and oriented x3. No focalization  Assessment & Plan:

## 2012-12-07 NOTE — Assessment & Plan Note (Addendum)
Unclear etiology. Chronic in nature. Could be endometriosis per HPI but diagnosis is laparoscopic. Negative abdominal exam for acute abdomen. Pt has her menses now and declines vaginal exam. Plan: CBC to rule out infectious process. UA Recommended to come back for a vaginal exam when she is ready.

## 2012-12-18 ENCOUNTER — Ambulatory Visit (INDEPENDENT_AMBULATORY_CARE_PROVIDER_SITE_OTHER): Payer: No Typology Code available for payment source | Admitting: Family Medicine

## 2012-12-18 ENCOUNTER — Other Ambulatory Visit: Payer: Self-pay | Admitting: Family Medicine

## 2012-12-18 ENCOUNTER — Other Ambulatory Visit (HOSPITAL_COMMUNITY)
Admission: RE | Admit: 2012-12-18 | Discharge: 2012-12-18 | Disposition: A | Discharge status: Home / Self Care | Payer: No Typology Code available for payment source | Source: Ambulatory Visit | Attending: Family Medicine | Admitting: Family Medicine

## 2012-12-18 VITALS — BP 146/79 | HR 84 | Temp 98.1°F | Ht 63.0 in | Wt 146.5 lb

## 2012-12-18 DIAGNOSIS — R300 Dysuria: Secondary | ICD-10-CM

## 2012-12-18 DIAGNOSIS — G8929 Other chronic pain: Secondary | ICD-10-CM

## 2012-12-18 DIAGNOSIS — Z01419 Encounter for gynecological examination (general) (routine) without abnormal findings: Secondary | ICD-10-CM | POA: Insufficient documentation

## 2012-12-18 DIAGNOSIS — R109 Unspecified abdominal pain: Secondary | ICD-10-CM

## 2012-12-18 DIAGNOSIS — Z124 Encounter for screening for malignant neoplasm of cervix: Secondary | ICD-10-CM

## 2012-12-18 DIAGNOSIS — R1031 Right lower quadrant pain: Secondary | ICD-10-CM

## 2012-12-18 DIAGNOSIS — R1030 Lower abdominal pain, unspecified: Secondary | ICD-10-CM

## 2012-12-18 LAB — POCT URINALYSIS DIPSTICK
Bilirubin, UA: NEGATIVE
Glucose, UA: NEGATIVE
Leukocytes, UA: NEGATIVE
Nitrite, UA: NEGATIVE
pH, UA: 7

## 2012-12-18 NOTE — Patient Instructions (Addendum)
Endometriosis Endometriosis is a disease that occurs when the endometrium (lining of the uterus) is misplaced outside of its normal location. It may occur in many locations close to the uterus (womb), but commonly on the ovaries, fallopian tubes, vagina (birth canal) and bowel located close to the uterus. Because the uterus sloughs (expels) its lining every month (menses), there is bleeding whereever the endometrial tissue is located. SYMPTOMS  Often there are no symptoms. However, because blood is irritating to tissues not normally exposed to it, when symptoms occur they vary with the location of the misplaced endometrium. Symptoms often include back and abdominal pain. Periods may be heavier and intercourse may be painful. Infertility may be present. You may have all of these symptoms at one time or another or you may have months with no symptoms at all. Although the symptoms occur mainly during menses, they can occur mid-cycle as well, and usually terminate with menopause. DIAGNOSIS  Your caregiver may recommend a blood test and urine test (urinalysis) to help rule out other conditions. Another common test is ultrasound, a painless procedure that uses sound waves to make a sonogram "picture" of abnormal tissue that could be endometriosis. If your bowel movements are painful around your periods, your caregiver may advise a barium enema (an X-ray of the lower bowel), to try to find the source of your pain. This is sometimes confirmed by laparoscopy. Laparoscopy is a procedure where your caregiver looks into your abdomen with a laparoscope (a small pencil sized telescope). Your caregiver may take a tiny piece of tissue (biopsy) from any abnormal tissue to confirm or document your problem. These tissues are sent to the lab and a pathologist looks at them under the microscope to give a microscopic diagnosis. TREATMENT  Once the diagnosis is made, it can be treated by destruction of the misplaced endometrial  tissue using heat (diathermy), laser, cutting (excision), or chemical means. It may also be treated with hormonal therapy. When using hormonal therapy menses are eliminated, therefore eliminating the monthly exposure to blood by the misplaced endometrial tissue. Only in severe cases is it necessary to perform a hysterectomy with removal of the tubes, uterus and ovaries. HOME CARE INSTRUCTIONS   Only take over-the-counter or prescription medicines for pain, discomfort, or fever as directed by your caregiver.  Avoid activities that produce pain, including a physical sexual relationship.  Do not take aspirin as this may increase bleeding when not on hormonal therapy.  See your caregiver for pain or problems not controlled with treatment. SEEK IMMEDIATE MEDICAL CARE IF:   Your pain is severe and is not responding to pain medicine.  You develop severe nausea and vomiting, or you cannot keep foods down.  Your pain localizes to the right lower part of your abdomen (possible appendicitis).  You have swelling or increasing pain in the abdomen.  You have a fever.  You see blood in your stool. MAKE SURE YOU:   Understand these instructions.  Will watch your condition.  Will get help right away if you are not doing well or get worse. Document Released: 10/14/2000 Document Revised: 01/09/2012 Document Reviewed: 06/04/2008 [facility] Patient Information 2013 [place], [place].  It has been a pleasure to see you today. I will call you with the labs results if they come back abnormal otherwise we will discussed at your next appointment. Make your next appointment in after U/S is done or sooner if you need it.

## 2012-12-18 NOTE — Progress Notes (Signed)
Family Medicine Office Visit Note   Subjective:   Patient ID: [name], female  DOB: 03/28/1966, 47 y.o.. MRN: [id number]   Pt that comes today  For vaginal evaluation since she finished her menses. The pain she was describing 2 weeks ago ended with her menses, but now she reports dysuria and frequency. Denies fever or chills, nausea or vomiting.  Review of Systems:  Per HPI  Objective:   Physical Exam: Gen:  NAD HEENT: Moist mucous membranes  CV: Regular rate and rhythm, no murmurs rubs or gallops ABD: Soft, non tender, non distended, normal bowel sounds. No CVA tenderness.  EXT: No edema GYN: Vulva and perianal area: Normal  Speculum: Vagina and cervix of normal appearance, no friability, no discharge. Bimanual exam: Uterus anteverted, no adnexal masses. No cervical motion tenderness.  Assessment & Plan:

## 2012-12-19 DIAGNOSIS — R300 Dysuria: Secondary | ICD-10-CM | POA: Insufficient documentation

## 2012-12-19 HISTORY — DX: Dysuria: R30.0

## 2012-12-19 NOTE — Assessment & Plan Note (Addendum)
Resolved when menses stopped. Physical exam benign. I still consider the diagnosis of Endometriosis but no definitive diagnosis yet.  Pap Smear obtained. Plan Pelvic ultrasound

## 2012-12-19 NOTE — Assessment & Plan Note (Signed)
Lower tract urinary symptoms. New complaint. Negative physical exam for CVA tenderness. Afebrile plan POCT urine.

## 2012-12-21 ENCOUNTER — Other Ambulatory Visit (HOSPITAL_COMMUNITY): Payer: No Typology Code available for payment source

## 2012-12-25 ENCOUNTER — Ambulatory Visit (HOSPITAL_COMMUNITY)
Admission: RE | Admit: 2012-12-25 | Discharge: 2012-12-25 | Disposition: A | Discharge status: Home / Self Care | Payer: No Typology Code available for payment source | Source: Ambulatory Visit | Attending: Family Medicine | Admitting: Family Medicine

## 2012-12-25 ENCOUNTER — Other Ambulatory Visit (HOSPITAL_COMMUNITY): Payer: No Typology Code available for payment source

## 2012-12-25 DIAGNOSIS — N949 Unspecified condition associated with female genital organs and menstrual cycle: Secondary | ICD-10-CM | POA: Insufficient documentation

## 2012-12-25 DIAGNOSIS — D251 Intramural leiomyoma of uterus: Secondary | ICD-10-CM | POA: Insufficient documentation

## 2012-12-25 DIAGNOSIS — R1031 Right lower quadrant pain: Secondary | ICD-10-CM | POA: Insufficient documentation

## 2013-01-17 ENCOUNTER — Encounter: Payer: Self-pay | Admitting: Family Medicine

## 2013-01-17 ENCOUNTER — Ambulatory Visit (INDEPENDENT_AMBULATORY_CARE_PROVIDER_SITE_OTHER): Payer: No Typology Code available for payment source | Admitting: Family Medicine

## 2013-01-17 VITALS — BP 131/60 | HR 76 | Temp 98.8°F | Ht 63.0 in | Wt 145.0 lb

## 2013-01-17 DIAGNOSIS — R1011 Right upper quadrant pain: Secondary | ICD-10-CM

## 2013-01-17 DIAGNOSIS — E119 Type 2 diabetes mellitus without complications: Secondary | ICD-10-CM | POA: Insufficient documentation

## 2013-01-17 DIAGNOSIS — R739 Hyperglycemia, unspecified: Secondary | ICD-10-CM

## 2013-01-17 DIAGNOSIS — R7309 Other abnormal glucose: Secondary | ICD-10-CM

## 2013-01-17 HISTORY — DX: Right upper quadrant pain: R10.11

## 2013-01-17 HISTORY — DX: Type 2 diabetes mellitus without complications: E11.9

## 2013-01-17 LAB — POCT URINALYSIS DIPSTICK
Bilirubin, UA: NEGATIVE
Ketones, UA: NEGATIVE
pH, UA: 5.5

## 2013-01-17 LAB — COMPREHENSIVE METABOLIC PANEL
AST: 20 U/L (ref 0–37)
Albumin: 4.5 g/dL (ref 3.5–5.2)
Alkaline Phosphatase: 60 U/L (ref 39–117)
BUN: 11 mg/dL (ref 6–23)
Potassium: 4 mEq/L (ref 3.5–5.3)

## 2013-01-17 LAB — CBC
HCT: 37.4 % (ref 36.0–46.0)
Hemoglobin: 12.7 g/dL (ref 12.0–15.0)
MCH: 28 pg (ref 26.0–34.0)
MCHC: 34 g/dL (ref 30.0–36.0)

## 2013-01-17 LAB — POCT UA - MICROSCOPIC ONLY

## 2013-01-17 LAB — LIPASE: Lipase: 39 U/L (ref 0–75)

## 2013-01-17 LAB — POCT GLYCOSYLATED HEMOGLOBIN (HGB A1C): Hemoglobin A1C: 6.8

## 2013-01-17 NOTE — Patient Instructions (Addendum)
It has been a pleasure to see you today. I will call you with the labs results and plan.  If your pain gets worse or you develop nausea, vomiting or fever you need to get evaluated right away.

## 2013-01-17 NOTE — Assessment & Plan Note (Signed)
Asymptomatic. Will obtain A1C.

## 2013-01-17 NOTE — Progress Notes (Signed)
Family Medicine Office Visit Note   Subjective:   Patient ID: [name], female  DOB: [birth year], 47 y.o.. MRN: [id number]   Pt that comes today complaining of abdominal RUQ pain. Pt reports having intermittent abdominal pain that exacerbates with certain food intake (ei. Spicy). This time the pain started 2 days ago. It is located on RUQ with no radiation. It is described as intermittent aching in nature and with Intensity of 9/10 at its worse. The associated symptoms are mild nausea but not vomiting. Pt denies fever, chills, diarrhea or other symptoms.  Pt has hx of cholecystectomy and she has been recently been evaluated for menstrual  pelvic pain.  She also concerned of her recent elevated glucose, denies polyuria or polydipsia. No syncope or hypoglycemic events.   Review of Systems:  Pt denies SOB, chest pain, palpitations, headaches, dizziness, numbness or weakness. No changes on urinary or BM habits. No unintentional weigh loss/gain.  Objective:   Physical Exam: Gen:  Sitting comfortably on exam bed. NAD HEENT: Moist mucous membranes  CV: Regular rate and rhythm, no murmurs rubs or gallops. PULM: Clear to auscultation bilaterally. No wheezes/rales/rhonchi ABD: Non distended. Soft, mildly tender on RUQ with deep palpation. No guarding, no rebound tenderness. Normal bowel sounds. EXT: No edema Neuro: Alert and oriented x3. No focalization  Assessment & Plan:

## 2013-01-17 NOTE — Assessment & Plan Note (Signed)
New onset of a recurrent problem. Hx cholecystectomy. No acute abdomen on examination. Pt tolerates PO. Plan: CBC, CMET, Lipase, RUQ ultrasound for further evaluation and r/o common bile stone. Discussed signs of worsening condition that should prompt re-evaluation.

## 2013-01-18 ENCOUNTER — Ambulatory Visit (HOSPITAL_COMMUNITY)
Admission: RE | Admit: 2013-01-18 | Discharge: 2013-01-18 | Disposition: A | Discharge status: Home / Self Care | Payer: No Typology Code available for payment source | Source: Ambulatory Visit | Attending: Family Medicine | Admitting: Family Medicine

## 2013-01-18 DIAGNOSIS — R109 Unspecified abdominal pain: Secondary | ICD-10-CM | POA: Insufficient documentation

## 2013-01-18 DIAGNOSIS — R1011 Right upper quadrant pain: Secondary | ICD-10-CM

## 2013-02-12 ENCOUNTER — Other Ambulatory Visit: Payer: Self-pay

## 2013-02-12 ENCOUNTER — Ambulatory Visit (INDEPENDENT_AMBULATORY_CARE_PROVIDER_SITE_OTHER): Payer: No Typology Code available for payment source | Admitting: Family Medicine

## 2013-02-12 ENCOUNTER — Other Ambulatory Visit: Payer: Self-pay | Admitting: Family Medicine

## 2013-02-12 ENCOUNTER — Ambulatory Visit
Admission: RE | Admit: 2013-02-12 | Discharge: 2013-02-12 | Disposition: A | Discharge status: Home / Self Care | Payer: Self-pay | Source: Ambulatory Visit

## 2013-02-12 ENCOUNTER — Encounter: Payer: Self-pay | Admitting: Family Medicine

## 2013-02-12 VITALS — BP 130/80 | HR 71 | Temp 97.7°F | Ht 63.0 in | Wt 143.0 lb

## 2013-02-12 DIAGNOSIS — Z1231 Encounter for screening mammogram for malignant neoplasm of breast: Secondary | ICD-10-CM

## 2013-02-12 DIAGNOSIS — M79622 Pain in left upper arm: Secondary | ICD-10-CM

## 2013-02-12 DIAGNOSIS — M79609 Pain in unspecified limb: Secondary | ICD-10-CM

## 2013-02-12 HISTORY — DX: Pain in left upper arm: M79.622

## 2013-02-12 MED ORDER — GABAPENTIN 300 MG PO CAPS: 300.0000 mg | ORAL_CAPSULE | Freq: Three times a day (TID) | ORAL | Status: DC

## 2013-02-12 NOTE — Assessment & Plan Note (Signed)
A: L axillary pain consisted with referred cervical neck pain. No evidence of breast mass or abnormality. P: Discussed proper posture and sleeping position to prevent pain. Gabapentin per orders Patient provided with referral sheet for f/u mammogram, orange card accepted at breast imaging center.

## 2013-02-12 NOTE — Progress Notes (Signed)
Subjective:     Patient ID: [name], female   DOB: [birth year], 47 y.o.   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 Patient reports sharp left axillary pain that radiates down her left arm has been intermittent for the past 6 months. The pain appears to be worse during her menstrual cycle and is associated with breast fullness. The pain is partially relieved with ibuprofen. The pain is not exacerbated by activity. There is no associated weakness in her left upper extremity. Patient denies associated fever, chills, weight loss. Patient's last mammogram was in 2007 and was normal she did have fibrous cystic changes noted on the mammogram.  Review of Systems As per history of present illness    Objective:   Physical Exam BP 130/80  Pulse 71  Temp(Src) 97.7 F (36.5 C) (Oral)  Ht 5\' 3"  (1.6 m)  Wt 143 lb (64.864 kg)  BMI 25.34 kg/m2 General appearance: alert, cooperative and no distress Neck: Full ROM, Pain partially reproduced with left rotation R with arm abducted 90 degrees and extended at the elbow no adenopathy and supple, symmetrical, trachea midline Breasts: normal appearance, no masses or tenderness, No nipple retraction or dimpling, No nipple discharge or bleeding, No axillary or supraclavicular adenopathy, Normal to palpation without dominant masses     Assessment and Plan:

## 2013-02-12 NOTE — Patient Instructions (Addendum)
[name],  Thank you for coming in today. Your pain appears to be referred pain from your neck.  Keep chin back.  Sleep on back with hands to your side at night.  Start gabapentin 300 mg at night as needed for pain, if you tolerate this 3 days later you can take one during the day and 3 days later you can take one midday.  Call the breast imaging center to set up screening mammogram.  Dr. Armen Pickup

## 2013-03-05 ENCOUNTER — Encounter: Payer: Self-pay | Admitting: Family Medicine

## 2013-03-05 ENCOUNTER — Ambulatory Visit (INDEPENDENT_AMBULATORY_CARE_PROVIDER_SITE_OTHER): Payer: No Typology Code available for payment source | Admitting: Family Medicine

## 2013-03-05 VITALS — BP 130/75 | HR 78 | Temp 98.8°F | Ht 63.0 in | Wt 143.0 lb

## 2013-03-05 DIAGNOSIS — J309 Allergic rhinitis, unspecified: Secondary | ICD-10-CM

## 2013-03-05 DIAGNOSIS — R739 Hyperglycemia, unspecified: Secondary | ICD-10-CM

## 2013-03-05 DIAGNOSIS — F459 Somatoform disorder, unspecified: Secondary | ICD-10-CM

## 2013-03-05 DIAGNOSIS — R0981 Nasal congestion: Secondary | ICD-10-CM

## 2013-03-05 DIAGNOSIS — J302 Other seasonal allergic rhinitis: Secondary | ICD-10-CM

## 2013-03-05 DIAGNOSIS — J3489 Other specified disorders of nose and nasal sinuses: Secondary | ICD-10-CM

## 2013-03-05 DIAGNOSIS — R7309 Other abnormal glucose: Secondary | ICD-10-CM

## 2013-03-05 DIAGNOSIS — F451 Undifferentiated somatoform disorder: Secondary | ICD-10-CM

## 2013-03-05 MED ORDER — FLUTICASONE PROPIONATE 50 MCG/ACT NA SUSP: 2.0000 | Freq: Every day | NASAL | Status: DC

## 2013-03-05 MED ORDER — METFORMIN HCL 500 MG PO TABS: 500.0000 mg | ORAL_TABLET | Freq: Two times a day (BID) | ORAL | Status: DC

## 2013-03-05 NOTE — Patient Instructions (Addendum)
Your tests showed an elevation on your blood sugars. We recommend to start taking metformin 500 mg once a day for a week and then increase to twice a day. You also have sinus congestion. You can use the spray prescribed every night and follow up as needed.  if you develop sever headache, numbness or tingling of your extremities please get evaluated right away. Make your next appointment when you come back from your trip or sooner if needed.

## 2013-03-12 DIAGNOSIS — R0981 Nasal congestion: Secondary | ICD-10-CM

## 2013-03-12 DIAGNOSIS — F419 Anxiety disorder, unspecified: Secondary | ICD-10-CM

## 2013-03-12 HISTORY — DX: Nasal congestion: R09.81

## 2013-03-12 HISTORY — DX: Anxiety disorder, unspecified: F41.9

## 2013-03-12 NOTE — Assessment & Plan Note (Signed)
Frontal headache, mild clear rhinorrhea. Diminished translucency on left maxillary and left frontal sinus. Afebrile. Hx of allergies in the past that have being worsening during this Spring. P/ No prior URI. No fever. No indication of antibiotic at this time. Nasonex for rhinorrhea

## 2013-03-12 NOTE — Assessment & Plan Note (Addendum)
Pt preoccupied with her health. She has being evaluated recently pretty much every month with a different intermittent symptoms with no laboratory/imaging that can explain them.  She has also hx of Depression,with further evaluation, pt reports has being with sad mood, anhedonia, trouble sleeping for more than twice a week. Denies suicidal ideation or plan. P/ Discussed with pt in depth. She currently refuses pharmachologic therapy. She reports going for an international trip about 40 days to see her family and knows this will help to reestablish her mood balance. She would like to ger re-evaluated upon her return to [place].

## 2013-03-12 NOTE — Assessment & Plan Note (Signed)
A1C 6.8 (elevated). No polyuria, polydipsia or polyphagia. No hypoglycemic episodes. Pt declines diet management and would like to start on Metformin. P/ Discussed in depth the importance of diet for glycemic control. We need two A1C to establish diagnosis of DM. Since we will start pharmacologic treatment this results may not be accurate. Nevertheless will recheck in 3 months and have more definitive answer regarding Dx and prognosis.

## 2013-03-12 NOTE — Progress Notes (Signed)
Family Medicine Office Visit Note   Subjective:   Patient ID: Rulon Sera, female  DOB: 1966/03/09, 47 y.o.. MRN: [id number]   Pt problem list addresses her current symptoms and plan. Since she has multiple complains today.  Review of Systems:  Pt denies SOB, chest pain, palpitations, dizziness, numbness or weakness. No changes on urinary or BM habits. No unintentional weigh loss/gain.  Objective:   Physical Exam: Gen:  NAD HEENT: Moist mucous membranes . Clear Rhinorrhea. Diminished translucency on left maxillary and frontal sinuses.  CV: Regular rate and rhythm, no murmurs rubs or gallops PULM: Clear to auscultation bilaterally. No wheezes/rales/rhonchi ABD: Soft, non tender, non distended, normal bowel sounds EXT: No edema Neuro: Alert and oriented x3. No focalization  Assessment & Plan:

## 2013-03-13 ENCOUNTER — Emergency Department (HOSPITAL_COMMUNITY)
Admission: EM | Admit: 2013-03-13 | Discharge: 2013-03-14 | Disposition: A | Discharge status: Home / Self Care | Payer: No Typology Code available for payment source | Attending: Emergency Medicine | Admitting: Emergency Medicine

## 2013-03-13 ENCOUNTER — Encounter (HOSPITAL_COMMUNITY): Payer: Self-pay | Admitting: Emergency Medicine

## 2013-03-13 DIAGNOSIS — Z79899 Other long term (current) drug therapy: Secondary | ICD-10-CM | POA: Insufficient documentation

## 2013-03-13 DIAGNOSIS — H538 Other visual disturbances: Secondary | ICD-10-CM | POA: Insufficient documentation

## 2013-03-13 DIAGNOSIS — E119 Type 2 diabetes mellitus without complications: Secondary | ICD-10-CM | POA: Insufficient documentation

## 2013-03-13 DIAGNOSIS — Z8659 Personal history of other mental and behavioral disorders: Secondary | ICD-10-CM | POA: Insufficient documentation

## 2013-03-13 DIAGNOSIS — Z3202 Encounter for pregnancy test, result negative: Secondary | ICD-10-CM | POA: Insufficient documentation

## 2013-03-13 DIAGNOSIS — R5383 Other fatigue: Secondary | ICD-10-CM | POA: Insufficient documentation

## 2013-03-13 DIAGNOSIS — R5381 Other malaise: Secondary | ICD-10-CM | POA: Insufficient documentation

## 2013-03-13 DIAGNOSIS — R209 Unspecified disturbances of skin sensation: Secondary | ICD-10-CM | POA: Insufficient documentation

## 2013-03-13 HISTORY — DX: Type 2 diabetes mellitus without complications: E11.9

## 2013-03-13 LAB — CBC WITH DIFFERENTIAL/PLATELET
Eosinophils Absolute: 0.2 10*3/uL (ref 0.0–0.7)
Lymphocytes Relative: 35 % (ref 12–46)
Lymphs Abs: 2 10*3/uL (ref 0.7–4.0)
MCH: 29.2 pg (ref 26.0–34.0)
Neutrophils Relative %: 47 % (ref 43–77)
Platelets: 255 10*3/uL (ref 150–400)
RBC: 4.25 MIL/uL (ref 3.87–5.11)
WBC: 5.6 10*3/uL (ref 4.0–10.5)

## 2013-03-13 LAB — COMPREHENSIVE METABOLIC PANEL
ALT: 29 U/L (ref 0–35)
AST: 21 U/L (ref 0–37)
Alkaline Phosphatase: 68 U/L (ref 39–117)
GFR calc Af Amer: >90 mL/min (ref >90)
Glucose, Bld: 106 mg/dL — ABNORMAL HIGH (ref 70–99)
Potassium: 3.9 mEq/L (ref 3.5–5.1)
Sodium: 140 mEq/L (ref 135–145)
Total Protein: 8.1 g/dL (ref 6.0–8.3)

## 2013-03-13 NOTE — ED Notes (Addendum)
PT. REPORTS GENERALIZED WEAKNESS WITH BLURRED VISION AND INTERMITTENT HEAD NUMBNESS ONSET LAST WEEK , STATES " I 'M ALWAYS HUNGRY " , HISTORY OF DIABETES MELLITUS.

## 2013-03-14 LAB — URINALYSIS, ROUTINE W REFLEX MICROSCOPIC
Nitrite: NEGATIVE
Specific Gravity, Urine: 1.007 (ref 1.005–1.030)
Urobilinogen, UA: 0.2 mg/dL (ref 0.0–1.0)

## 2013-03-14 NOTE — ED Notes (Signed)
Pt comfortable with d/c and f/u instructions. No prescriptions 

## 2013-03-14 NOTE — ED Provider Notes (Signed)
Medical screening examination/treatment/procedure(s) were performed by non-physician practitioner and as supervising physician I was immediately available for consultation/collaboration.     Zelta Enfield R Paxten Appelt, MD 03/14/13 1541 

## 2013-03-14 NOTE — ED Provider Notes (Signed)
History     CSN: [id number]  Arrival date & time 03/13/13  2230   First MD Initiated Contact with Patient 03/13/13 2318      Chief Complaint  Patient presents with  . Fatigue  . Blurred Vision  . Numbness    (Consider location/radiation/quality/duration/timing/severity/associated sxs/prior treatment) The history is provided by the patient.  [name] is a 47 y.o. female with a history of diabetes & depression presents to the ER c/o blurred vision, "head numbness" and general fatigue. Onset of s/s began 2 weeks ago. No other associated symptoms. No known exacerbating or alleviating factors. Pt denies HA, double vision, head trauma, F, NS, chills, abdominal pain, change in bowel movements, easy bruising, CP, or  SOB. Medical chart reviewed and it appears that patient is seen monthly by her PCP for different somatic complaints that have been evaluated by imaging and labs with no clear etiology. When asked about depression, pt states that she has been more sad lately bc her mother past, but denies SI/HI.   Past Medical History  Diagnosis Date  . Somatic complaints, multiple   . Depressed   . Abnormal abdominal CT scan 01/20/2005    and pelvic CT, normal   . Abdominal ultrasound, abnormal     normal  . History of pelvic ultrasound 12/06    normal   . History of barium enema     normal  . Diabetes mellitus without complication     Past Surgical History  Procedure Laterality Date  . Appendectomy    . Caesarean section      x2  . Laparoscopic cholecystectomy  2009  . Cholecystectomy      Family History  Problem Relation Age of Onset  . Diabetes Mother   . Hypertension Mother   . Throat cancer Father     non-smoker    History  Substance Use Topics  . Smoking status: Never Smoker   . Smokeless tobacco: Not on file  . Alcohol Use: No    OB History   Grav Para Term Preterm Abortions TAB SAB Ect Mult Living                  Review of Systems  All other  systems reviewed and are negative.    Allergies  Review of patient's allergies indicates no known allergies.  Home Medications   Current Outpatient Rx  Name  Route  Sig  Dispense  Refill  . fluticasone (FLONASE) 50 MCG/ACT nasal spray   Nasal   Place 2 sprays into the nose daily.         Marland Kitchen gabapentin (NEURONTIN) 300 MG capsule   Oral   Take 300 mg by mouth 3 (three) times daily.         . metFORMIN (GLUCOPHAGE) 500 MG tablet   Oral   Take 500 mg by mouth 2 (two) times daily with a meal.           BP 139/85  Pulse 72  Temp(Src) 98 F (36.7 C) (Oral)  Resp 20  SpO2 100%  LMP 02/17/2013  Physical Exam  Nursing note and vitals reviewed. Constitutional: She is oriented to person, place, and time. She appears well-developed and well-nourished. No distress.  Patient not in visible distress  HENT:  Head: Normocephalic and atraumatic.  Eyes: Conjunctivae and EOM are normal. Pupils are equal, round, and reactive to light.  Neck: Normal range of motion. Neck supple. Normal carotid pulses, no hepatojugular reflux and no  JVD present. Carotid bruit is not present.  No carotid bruits  Cardiovascular:  RRR, no aberrancy on auscultation, intact distal pulses no pitting edema bilaterally.  Pulmonary/Chest: Effort normal and breath sounds normal. No respiratory distress.  Lungs clear to auscultation bilaterally  Abdominal: Soft. She exhibits no distension. There is no tenderness.  Nonpulsatile aorta  Musculoskeletal: Normal range of motion. She exhibits no tenderness.  Neurological: She is alert and oriented to person, place, and time. She displays a negative Romberg sign.  Cranial nerves III through XII intact, normal coordination, negative Romberg, strength 5/5 bilaterally. No ataxia  Skin: Skin is warm and dry. No pallor.  Psychiatric: She has a normal mood and affect. Her behavior is normal.    ED Course  Procedures (including critical care time)  Labs Reviewed  CBC  WITH DIFFERENTIAL - Abnormal; Notable for the following:    Monocytes Relative 14 (*)    All other components within normal limits  COMPREHENSIVE METABOLIC PANEL - Abnormal; Notable for the following:    Glucose, Bld 106 (*)    All other components within normal limits  URINALYSIS, ROUTINE W REFLEX MICROSCOPIC  POCT PREGNANCY, URINE   No results found.   No diagnosis found.    MDM  Pt to ER c/o fatigue, "head numbness" and blurred vision. Labs reviewed with no abnormalities. No focal neuro deficits. Normal orthostatics and visual acuity. Suspect fatigue may be d/t some mild underlying depression. No SI. Advised PCP follow up for further evaluation.  At this time there does not appear to be any evidence of an acute emergency medical condition and the patient appears stable for discharge with appropriate outpatient follow up.Diagnosis was discussed with patient who verbalizes understanding and is agreeable to discharge.   Addendum: nursing has informed me that near vision was slightly blurred with 20/50 right eye and 20/40 left eye. Will refer to Optometrist. Normal distance vision.       Jaci Carrel, PA-C 03/14/13 0031  Jaci Carrel, PA-C 03/14/13 [phone]

## 2013-03-28 ENCOUNTER — Ambulatory Visit (INDEPENDENT_AMBULATORY_CARE_PROVIDER_SITE_OTHER): Payer: No Typology Code available for payment source | Admitting: Family Medicine

## 2013-03-28 ENCOUNTER — Encounter: Payer: Self-pay | Admitting: Family Medicine

## 2013-03-28 VITALS — BP 123/72 | HR 90 | Temp 98.9°F | Ht 63.0 in | Wt 143.0 lb

## 2013-03-28 DIAGNOSIS — N912 Amenorrhea, unspecified: Secondary | ICD-10-CM

## 2013-03-28 NOTE — Patient Instructions (Addendum)
Observe and keep a log of your menses.   Make your next appointment in 2 months or sooner if discussed symptoms that warrant evaluation appear.

## 2013-03-29 DIAGNOSIS — N912 Amenorrhea, unspecified: Secondary | ICD-10-CM | POA: Insufficient documentation

## 2013-03-29 HISTORY — DX: Amenorrhea, unspecified: N91.2

## 2013-03-29 NOTE — Assessment & Plan Note (Addendum)
5w.5d of amenorrhea. Upreg negative. Only few off her period's schedule. Unclear etiology. No other associated signs that raises concern. No FHx of early menopause. Pt had recent pelvic ultrasound only positive for intramural fibroid (1.4 x 1.4 x 1.0cm). P/ Observation and close f/u. If more than 3 months or other associated symptoms will obtain FSH/LH. Discussed signs that should prompt re-evaluation.

## 2013-03-29 NOTE — Progress Notes (Signed)
Family Medicine Office Visit Note   Subjective:   Patient ID: [name], female  DOB: Aug 01, 1966, 47 y.o.. MRN: [id number]   Pt that comes today complaining of amenorrhea for 5w 5d. Her LMP was April 20th. She denies nausea or vomiting. Pt reports an episode of mild dizziness, she checked her CBG which was 167; this self-resolved and was never associated to chest pain or palpitations.  Pt had no further concerns she would like to address today. She defers her Depression evaluation to further visits when she comes back from her trip, denies suicidal thoughts or plans.  Review of Systems:  Per HPI  Objective:    Physical Exam: Gen:  NAD HEENT: Moist mucous membranes  CV: Regular rate and rhythm, no murmurs rubs or gallops PULM: Clear to auscultation bilaterally. No wheezes/rales/rhonchi ABD: Soft, non tender, non distended, normal bowel sounds EXT: No edema Neuro: Alert and oriented x3. No focalization  Assessment & Plan:

## 2013-07-18 ENCOUNTER — Emergency Department (INDEPENDENT_AMBULATORY_CARE_PROVIDER_SITE_OTHER): Payer: Self-pay

## 2013-07-18 ENCOUNTER — Encounter (HOSPITAL_COMMUNITY): Payer: Self-pay | Admitting: *Deleted

## 2013-07-18 ENCOUNTER — Emergency Department (INDEPENDENT_AMBULATORY_CARE_PROVIDER_SITE_OTHER)
Admission: EM | Admit: 2013-07-18 | Discharge: 2013-07-18 | Disposition: A | Discharge status: Home / Self Care | Payer: Self-pay | Source: Home / Self Care | Attending: Emergency Medicine | Admitting: Emergency Medicine

## 2013-07-18 DIAGNOSIS — J209 Acute bronchitis, unspecified: Secondary | ICD-10-CM

## 2013-07-18 MED ORDER — ALBUTEROL SULFATE HFA 108 (90 BASE) MCG/ACT IN AERS: 2.0000 | INHALATION_SPRAY | Freq: Four times a day (QID) | RESPIRATORY_TRACT | Status: DC

## 2013-07-18 MED ORDER — FLUTICASONE PROPIONATE 50 MCG/ACT NA SUSP: 2.0000 | Freq: Every day | NASAL | Status: DC

## 2013-07-18 MED ORDER — PREDNISONE 20 MG PO TABS: 20.0000 mg | ORAL_TABLET | Freq: Two times a day (BID) | ORAL | Status: DC

## 2013-07-18 MED ORDER — GUAIFENESIN-CODEINE 100-10 MG/5ML PO SYRP: 10.0000 mL | ORAL_SOLUTION | Freq: Four times a day (QID) | ORAL | Status: DC | PRN

## 2013-07-18 MED ORDER — AMOXICILLIN 500 MG PO CAPS: 1000.0000 mg | ORAL_CAPSULE | Freq: Three times a day (TID) | ORAL | Status: DC

## 2013-07-18 NOTE — ED Provider Notes (Signed)
Chief Complaint:   Chief Complaint  Patient presents with  . Sore Throat    History of Present Illness:   [name] is a 47 year old female who presents with a one-week history of sore throat, itchy, red, watery eyes, swelling of her lids, nasal congestion, rhinorrhea with clear drainage, headache, sinus pressure, itching and congestion her ear is, temperature of up to 103, chills, dry cough, and diarrhea. She denies any wheezing or chest pain. No nausea, vomiting, or abdominal pain. She has no known sick exposures.  Review of Systems:  Other than noted above, the patient denies any of the following symptoms: Systemic:  No fevers, chills, sweats, weight loss or gain, fatigue, or tiredness. Eye:  No redness or discharge. ENT:  No ear pain, drainage, headache, nasal congestion, drainage, sinus pressure, difficulty swallowing, or sore throat. Neck:  No neck pain or swollen glands. Lungs:  No cough, sputum production, hemoptysis, wheezing, chest tightness, shortness of breath or chest pain. GI:  No abdominal pain, nausea, vomiting or diarrhea.  PMFSH:  Past medical history, family history, social history, meds, and allergies were reviewed. She has type 2 diabetes and takes metformin.  Physical Exam:   Vital signs:  BP 126/72  Pulse 72  Temp(Src) 98.6 F (37 C) (Oral)  Resp 16  SpO2 100%  LMP 05/17/2013 General:  Alert and oriented.  In no distress.  Skin warm and dry. Eye:  No conjunctival injection or drainage. Lids were normal. ENT:  TMs and canals were normal, without erythema or inflammation.  Nasal mucosa was clear and uncongested, without drainage.  Mucous membranes were moist.  Pharynx was clear with no exudate or drainage.  There were no oral ulcerations or lesions. Neck:  Supple, no adenopathy, tenderness or mass. Lungs:  No respiratory distress.  Lungs were clear to auscultation, without wheezes, rales or rhonchi.  Breath sounds were clear and equal bilaterally.  Heart:   Regular rhythm, without gallops, murmers or rubs. Skin:  Clear, warm, and dry, without rash or lesions.  Labs:   Results for orders placed during the hospital encounter of 07/18/13  POCT RAPID STREP A ([facility] ONLY)      Result Value Range   Streptococcus, Group A Screen (Direct) NEGATIVE  NEGATIVE  POCT PREGNANCY, URINE      Result Value Range   Preg Test, Ur NEGATIVE  NEGATIVE     Radiology:  Dg Chest 2 View  07/18/2013   CLINICAL DATA:  Shortness of Breath and congestion  EXAM: CHEST  2 VIEW  COMPARISON:  December 16, 2010  FINDINGS: Lungs are clear. Heart size and pulmonary vascularity are normal. No adenopathy. No bone lesions.  IMPRESSION: No edema or consolidation.   Electronically Signed   By: Bretta Bang   On: 07/18/2013 12:45   Assessment:  The encounter diagnosis was Acute bronchitis.  Plan:   1.  Meds:  The following meds were prescribed:   Discharge Medication List as of 07/18/2013  1:01 PM    START taking these medications   Details  albuterol (PROVENTIL HFA;VENTOLIN HFA) 108 (90 BASE) MCG/ACT inhaler Inhale 2 puffs into the lungs 4 (four) times daily., Starting 07/18/2013, Until Discontinued, Normal    amoxicillin (AMOXIL) 500 MG capsule Take 2 capsules (1,000 mg total) by mouth 3 (three) times daily., Starting 07/18/2013, Until Discontinued, Normal    !! fluticasone (FLONASE) 50 MCG/ACT nasal spray Place 2 sprays into the nose daily., Starting 07/18/2013, Until Discontinued, Normal  guaiFENesin-codeine (GUIATUSS AC) 100-10 MG/5ML syrup Take 10 mLs by mouth 4 (four) times daily as needed for cough., Starting 07/18/2013, Until Discontinued, Print    predniSONE (DELTASONE) 20 MG tablet Take 1 tablet (20 mg total) by mouth 2 (two) times daily., Starting 07/18/2013, Until Discontinued, Normal     !! - Potential duplicate medications found. Please discuss with provider.      2.  Patient Education/Counseling:  The patient was given appropriate handouts, self care  instructions, and instructed in symptomatic relief.  Instructed to use warm saltwater gargles and lozenges for throat.  3.  Follow up:  The patient was told to follow up if no better in 3 to 4 days, if becoming worse in any way, and given some red flag symptoms such as increasing fever or difficulty breathing which would prompt immediate return.  Follow up here if necessary.      Reuben Likes, MD 07/18/13 [phone]

## 2013-07-18 NOTE — ED Notes (Signed)
Pt  Reports  Symptoms  Of  sorethroat  With  Nasal  Congestion          Stuffy  Nose  And  Body  Aches  For  sev  Days

## 2013-07-21 LAB — CULTURE, GROUP A STREP

## 2013-07-24 NOTE — ED Notes (Signed)
Group B and group A strep negative on fin al report throat lab screening/culture

## 2013-07-24 NOTE — ED Notes (Signed)
Lab review

## 2013-09-24 ENCOUNTER — Encounter (HOSPITAL_COMMUNITY): Payer: Self-pay | Admitting: Emergency Medicine

## 2013-09-24 ENCOUNTER — Emergency Department (INDEPENDENT_AMBULATORY_CARE_PROVIDER_SITE_OTHER)
Admission: EM | Admit: 2013-09-24 | Discharge: 2013-09-24 | Disposition: A | Discharge status: Home / Self Care | Payer: Self-pay | Source: Home / Self Care

## 2013-09-24 DIAGNOSIS — R42 Dizziness and giddiness: Secondary | ICD-10-CM

## 2013-09-24 DIAGNOSIS — R110 Nausea: Secondary | ICD-10-CM

## 2013-09-24 DIAGNOSIS — R5381 Other malaise: Secondary | ICD-10-CM

## 2013-09-24 DIAGNOSIS — R531 Weakness: Secondary | ICD-10-CM

## 2013-09-24 DIAGNOSIS — R3129 Other microscopic hematuria: Secondary | ICD-10-CM

## 2013-09-24 DIAGNOSIS — G44209 Tension-type headache, unspecified, not intractable: Secondary | ICD-10-CM

## 2013-09-24 LAB — POCT I-STAT, CHEM 8
HCT: 41 % (ref 36.0–46.0)
Hemoglobin: 13.9 g/dL (ref 12.0–15.0)
Sodium: 140 mEq/L (ref 135–145)
TCO2: 24 mmol/L (ref 0–100)

## 2013-09-24 LAB — POCT URINALYSIS DIP (DEVICE)
Bilirubin Urine: NEGATIVE
Glucose, UA: NEGATIVE mg/dL
Leukocytes, UA: NEGATIVE
Nitrite: NEGATIVE

## 2013-09-24 MED ORDER — ONDANSETRON 4 MG PO TBDP
ORAL_TABLET | ORAL | Status: AC
  Filled 2013-09-24: qty 1

## 2013-09-24 MED ORDER — KETOROLAC TROMETHAMINE 30 MG/ML IJ SOLN
30.0000 mg | Freq: Once | INTRAMUSCULAR | Status: AC
  Administered 2013-09-24: 30 mg via INTRAMUSCULAR

## 2013-09-24 MED ORDER — DIPHENHYDRAMINE HCL 50 MG/ML IJ SOLN
25.0000 mg | Freq: Once | INTRAMUSCULAR | Status: AC
  Administered 2013-09-24: 25 mg via INTRAMUSCULAR

## 2013-09-24 MED ORDER — ONDANSETRON 4 MG PO TBDP
4.0000 mg | ORAL_TABLET | Freq: Once | ORAL | Status: AC
  Administered 2013-09-24: 4 mg via ORAL

## 2013-09-24 MED ORDER — ONDANSETRON HCL 4 MG PO TABS: 4.0000 mg | ORAL_TABLET | Freq: Three times a day (TID) | ORAL | Status: DC | PRN

## 2013-09-24 MED ORDER — KETOROLAC TROMETHAMINE 30 MG/ML IJ SOLN
INTRAMUSCULAR | Status: AC
  Filled 2013-09-24: qty 1

## 2013-09-24 MED ORDER — DIPHENHYDRAMINE HCL 50 MG/ML IJ SOLN
INTRAMUSCULAR | Status: AC
  Filled 2013-09-24: qty 1

## 2013-09-24 NOTE — ED Provider Notes (Signed)
CSN: [id number]     Arrival date & time 09/24/13  1400 History   First MD Initiated Contact with Patient 09/24/13 1420     Chief Complaint  Patient presents with  . Dizziness   (Consider location/radiation/quality/duration/timing/severity/associated sxs/prior Treatment) HPI Comments: 47 year old [place] female presents with complaints that include an onset of a headache at 11 AM today. Associated with nausea, acute weakness, shaking and numbness in the bottom of the feet and blurred vision. Denies double vision,  problems wiith speech, hearing or swallowing. She states most of the symptoms have improved or resolved with exception of her headache which is frontal and vertex. She places her hand over fore head and spit it back to the vertex as the area of head pain.  She has a history of type II these mellitus as well as somatization. Her medical chart reveals that she is preoccupied with her health and visits her primary care provider or other [facility] facility at least twice a month for a myriad of complaints. She has had many labs and imaging to workup several  complaints.  She has been diagnosed with anxiety, depression, somatization with histrionic behavior. T2DM tx with Metformin.   Past Medical History  Diagnosis Date  . Somatic complaints, multiple   . Depressed   . Abnormal abdominal CT scan 01/20/2005    and pelvic CT, normal   . Abdominal ultrasound, abnormal     normal  . History of pelvic ultrasound 12/06    normal   . History of barium enema     normal  . Diabetes mellitus without complication    Past Surgical History  Procedure Laterality Date  . Appendectomy    . Caesarean section      x2  . Laparoscopic cholecystectomy  2009  . Cholecystectomy     Family History  Problem Relation Age of Onset  . Diabetes Mother   . Hypertension Mother   . Throat cancer Father     non-smoker   History  Substance Use Topics  . Smoking status: Never Smoker   .  Smokeless tobacco: Not on file  . Alcohol Use: No   OB History   Grav Para Term Preterm Abortions TAB SAB Ect Mult Living                 Review of Systems  Constitutional: Positive for activity change, appetite change and fatigue. Negative for fever and diaphoresis.  HENT: Negative for congestion, drooling, ear discharge, ear pain, nosebleeds, postnasal drip, rhinorrhea, sinus pressure and sore throat.   Eyes: Positive for visual disturbance.  Respiratory: Negative for cough, choking and shortness of breath.   Cardiovascular: Negative for chest pain and leg swelling.  Gastrointestinal: Positive for nausea. Negative for vomiting.  Genitourinary:       At the time of discharge the patient remembered that she was having some discomfort with urination for one to 2 weeks.  Musculoskeletal: Negative.   Skin: Negative.   Neurological: Positive for dizziness, light-headedness and headaches. Negative for tremors, syncope and weakness.    Allergies  Review of patient's allergies indicates no known allergies.  Home Medications   Current Outpatient Rx  Name  Route  Sig  Dispense  Refill  . metFORMIN (GLUCOPHAGE) 500 MG tablet   Oral   Take 500 mg by mouth 2 (two) times daily with a meal.         . albuterol (PROVENTIL HFA;VENTOLIN HFA) 108 (90 BASE) MCG/ACT inhaler   Inhalation  Inhale 2 puffs into the lungs 4 (four) times daily.   1 Inhaler   0   . amoxicillin (AMOXIL) 500 MG capsule   Oral   Take 2 capsules (1,000 mg total) by mouth 3 (three) times daily.   60 capsule   0     Dispense as written.   . fluticasone (FLONASE) 50 MCG/ACT nasal spray   Nasal   Place 2 sprays into the nose daily.         . fluticasone (FLONASE) 50 MCG/ACT nasal spray   Nasal   Place 2 sprays into the nose daily.   16 g   0   . gabapentin (NEURONTIN) 300 MG capsule   Oral   Take 300 mg by mouth 3 (three) times daily.         Marland Kitchen guaiFENesin-codeine (GUIATUSS AC) 100-10 MG/5ML syrup    Oral   Take 10 mLs by mouth 4 (four) times daily as needed for cough.   120 mL   0   . ondansetron (ZOFRAN) 4 MG tablet   Oral   Take 1 tablet (4 mg total) by mouth every 8 (eight) hours as needed for nausea. May cause constipation   10 tablet   0   . predniSONE (DELTASONE) 20 MG tablet   Oral   Take 1 tablet (20 mg total) by mouth 2 (two) times daily.   10 tablet   0    BP 153/84  Pulse 73  Temp(Src) 98.3 F (36.8 C) (Oral)  Resp 18  SpO2 100%  LMP 08/24/2013 Physical Exam  Nursing note and vitals reviewed. Constitutional: She is oriented to person, place, and time. She appears well-developed and well-nourished. No distress.  HENT:  Head: Normocephalic and atraumatic.  Right Ear: External ear normal.  Left Ear: External ear normal.  Mouth/Throat: Oropharynx is clear and moist. No oropharyngeal exudate.  Tenderness to the fore head scalp extending to the vertex.  Eyes: Conjunctivae and EOM are normal. Pupils are equal, round, and reactive to light.  Neck: Normal range of motion. Neck supple.  Cardiovascular: Normal rate.   Murmur heard. Pulmonary/Chest: Effort normal and breath sounds normal. No respiratory distress. She has no wheezes. She has no rales.  Abdominal: Soft. There is no tenderness. There is no rebound and no guarding.  Musculoskeletal: Normal range of motion. She exhibits no edema and no tenderness.  Lymphadenopathy:    She has no cervical adenopathy.  Neurological: She is alert and oriented to person, place, and time. She has normal strength. She displays no tremor. No cranial nerve deficit or sensory deficit. She exhibits normal muscle tone. She displays a negative Romberg sign. Coordination normal.  Reflex Scores:      Patellar reflexes are 1+ on the right side and 1+ on the left side. Finger to nose nl Tandem gait barefoot, fell out of line once.  Smiling during these exams.   Skin: Skin is warm and dry. She is not diaphoretic.  Psychiatric: She has  a normal mood and affect.    ED Course  Procedures (including critical care time) Labs Review Labs Reviewed  POCT I-STAT, CHEM 8 - Abnormal; Notable for the following:    Glucose, Bld 146 (*)    Calcium, Ion 1.24 (*)    All other components within normal limits  POCT URINALYSIS DIP (DEVICE) - Abnormal; Notable for the following:    Hgb urine dipstick MODERATE (*)    All other components within normal limits   Imaging  Review No results found.  Results for orders placed during the hospital encounter of 09/24/13  POCT I-STAT, CHEM 8      Result Value Range   Sodium 140  135 - 145 mEq/L   Potassium 4.0  3.5 - 5.1 mEq/L   Chloride 105  96 - 112 mEq/L   BUN 13  6 - 23 mg/dL   Creatinine, Ser 1.61  0.50 - 1.10 mg/dL   Glucose, Bld 096 (*) 70 - 99 mg/dL   Calcium, Ion 0.45 (*) 1.12 - 1.23 mmol/L   TCO2 24  0 - 100 mmol/L   Hemoglobin 13.9  12.0 - 15.0 g/dL   HCT 40.9  81.1 - 91.4 %  POCT URINALYSIS DIP (DEVICE)      Result Value Range   Glucose, UA NEGATIVE  NEGATIVE mg/dL   Bilirubin Urine NEGATIVE  NEGATIVE   Ketones, ur NEGATIVE  NEGATIVE mg/dL   Specific Gravity, Urine <=1.005  1.005 - 1.030   Hgb urine dipstick MODERATE (*) NEGATIVE   pH 6.0  5.0 - 8.0   Protein, ur NEGATIVE  NEGATIVE mg/dL   Urobilinogen, UA 0.2  0.0 - 1.0 mg/dL   Nitrite NEGATIVE  NEGATIVE   Leukocytes, UA NEGATIVE  NEGATIVE       MDM   1. Tension type headache   2. Dizziness   3. Nausea   4. Weakness   5. Microscopic hematuria     The patient,s headache is most likely a tension type headache. Her neurologic exam is unremarkable. As a matter fact her entire physical exam is within normal limits. She states that most of her symptoms have ameliorated or abated. The etiology of the sudden weakness is uncertain. May be possible that she had an episode of hypoglycemia however doubtful since the only medication she takes is metformin. Her blood sugar is 146 in the office. Other labs WNL. There is  consideration of inner ear disorder. The most likely scenario is that her anxiety is the basis for the development of a tension headache is associated with the above mentioned symptoms. Somatization and histrionics is a  part of her medical history.  toradol 30 mg IM Benadryl 25mg  IM zofran 4mg  po.   Hayden Rasmussen, NP 09/24/13 1535

## 2013-09-24 NOTE — ED Notes (Signed)
C/o dizziness, headache, blurred vision and nausea.  Onset 1 hour ago.  Hx of DM.  Denies any other symptoms.  No otc tried to symptoms.   States "this has never happened to me before:"

## 2013-09-27 NOTE — ED Provider Notes (Signed)
Medical screening examination/treatment/procedure(s) were performed by a resident physician or non-physician practitioner and as the supervising physician I was immediately available for consultation/collaboration.  Clementeen Graham, MD   Rodolph Bong, MD 09/27/13 [phone]

## 2013-09-30 ENCOUNTER — Encounter: Payer: Self-pay | Admitting: Family Medicine

## 2013-09-30 ENCOUNTER — Ambulatory Visit (INDEPENDENT_AMBULATORY_CARE_PROVIDER_SITE_OTHER): Payer: Self-pay | Admitting: Family Medicine

## 2013-09-30 VITALS — BP 131/85 | HR 83 | Temp 98.3°F | Ht 63.0 in | Wt 146.4 lb

## 2013-09-30 DIAGNOSIS — J329 Chronic sinusitis, unspecified: Secondary | ICD-10-CM

## 2013-09-30 DIAGNOSIS — J321 Chronic frontal sinusitis: Secondary | ICD-10-CM

## 2013-09-30 DIAGNOSIS — E119 Type 2 diabetes mellitus without complications: Secondary | ICD-10-CM

## 2013-09-30 HISTORY — DX: Chronic sinusitis, unspecified: J32.9

## 2013-09-30 MED ORDER — AMOXICILLIN-POT CLAVULANATE 875-125 MG PO TABS: 1.0000 | ORAL_TABLET | Freq: Two times a day (BID) | ORAL | Status: DC

## 2013-09-30 NOTE — Assessment & Plan Note (Signed)
Newly diagnosed. Two A1C above 6.5. Pt instructed to start Metformin as recommended and f/u in 2-3 months for DM control.

## 2013-09-30 NOTE — Assessment & Plan Note (Addendum)
Frontal headache seen 5 days ago at Urgent Care. Normal neurologic exam beside pt reporting one time numbness of her left foot 5 days ago. Clinically and exam consistent with rhinosinusitis. Pt was treated 3 months ago with amox for acute bronchitis. Will escalate to Augmentin this time. 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

## 2013-09-30 NOTE — Patient Instructions (Addendum)
It has been a pleasure to see you today. Please take the medications as prescribed. If you develop any weakness, numbness, or other neurologic symptoms, please get evaluated right away.

## 2013-09-30 NOTE — Progress Notes (Signed)
Family Medicine Office Visit Note   Subjective:   Patient ID: [name], female  DOB: 10-13-1966, 47 y.o.. MRN: [id number]   Pt that comes today complaining of headaches, she was seen recently (five days ago) at Urgent Care for this complaint. She reports her headache is frontal and sometimes in maxillary area and has not completely resolved. She reports had some numbness on her left foot up to her ankle when her headache started but this resolved completely the same day.  She denies fever, chills, nausea, or other systemic symptoms. Pt reports mild to moderated yellowish runny nose and post nasal dripping.   Her other concern today is her hyperglycemia. She reports not taking Metformin recommended in her last appointment. Denies hypoglycemic events but reports polydipsia, polyuria and polyphagia. Pt  is requesting A1C testing today to determine if she has DM or not.   Review of Systems:  Per HPI  Objective:   Physical Exam: Gen:  NAD HEENT: Moist mucous membranes. Postnasal drainage seen, erythematous oropharynx without exudates. Transillumination of frontal sinuses is decreased bilaterally.  CV: Regular rate and rhythm, no murmurs. PULM: Clear to auscultation bilaterally.  EXT: No edema Neuro: Alert and oriented x3. No focalization. 5/5 strength in all extremities, normal sensation and reflexes. CN II-XII intact, normal gait and coordination.   Assessment & Plan:

## 2013-11-23 ENCOUNTER — Encounter (HOSPITAL_COMMUNITY): Payer: Self-pay | Admitting: Emergency Medicine

## 2013-11-23 ENCOUNTER — Emergency Department (HOSPITAL_COMMUNITY)
Admission: EM | Admit: 2013-11-23 | Discharge: 2013-11-23 | Disposition: A | Discharge status: Home / Self Care | Payer: Self-pay | Attending: Emergency Medicine | Admitting: Emergency Medicine

## 2013-11-23 DIAGNOSIS — Y929 Unspecified place or not applicable: Secondary | ICD-10-CM | POA: Insufficient documentation

## 2013-11-23 DIAGNOSIS — W230XXA Caught, crushed, jammed, or pinched between moving objects, initial encounter: Secondary | ICD-10-CM | POA: Insufficient documentation

## 2013-11-23 DIAGNOSIS — Z79899 Other long term (current) drug therapy: Secondary | ICD-10-CM | POA: Insufficient documentation

## 2013-11-23 DIAGNOSIS — S61209A Unspecified open wound of unspecified finger without damage to nail, initial encounter: Secondary | ICD-10-CM | POA: Insufficient documentation

## 2013-11-23 DIAGNOSIS — E119 Type 2 diabetes mellitus without complications: Secondary | ICD-10-CM | POA: Insufficient documentation

## 2013-11-23 DIAGNOSIS — S61219A Laceration without foreign body of unspecified finger without damage to nail, initial encounter: Secondary | ICD-10-CM

## 2013-11-23 DIAGNOSIS — Z9089 Acquired absence of other organs: Secondary | ICD-10-CM | POA: Insufficient documentation

## 2013-11-23 DIAGNOSIS — Y939 Activity, unspecified: Secondary | ICD-10-CM | POA: Insufficient documentation

## 2013-11-23 DIAGNOSIS — S6000XA Contusion of unspecified finger without damage to nail, initial encounter: Secondary | ICD-10-CM | POA: Insufficient documentation

## 2013-11-23 DIAGNOSIS — Z8659 Personal history of other mental and behavioral disorders: Secondary | ICD-10-CM | POA: Insufficient documentation

## 2013-11-23 DIAGNOSIS — S6710XA Crushing injury of unspecified finger(s), initial encounter: Secondary | ICD-10-CM | POA: Insufficient documentation

## 2013-11-23 NOTE — ED Notes (Signed)
Pt can wiggle digit, sensation present. Cap refill less than 3 seconds. Bleeding controlled

## 2013-11-23 NOTE — Discharge Instructions (Signed)
You did not wish to have x-rays today of your finger.  Keep your wound clean and dry.  Wash gently with mild soap and water.  Dry well.  Follow up with your doctor for continued evaluation and treatment.    Crush Injury, Fingers or Toes A crush injury means the fingers or toes are hurt by being squeezed (compressed). HOME CARE  Raise (elevate) the injured part above the level of your heart. Do this as much as you can for the first few days.  Put ice on the injured area.  Put ice in a plastic bag.  Place a towel between your skin and the bag.  Leave the ice on for 15-20 minutes, 03-04 times a day for the first 2 days.  Only take medicine as told by your doctor.  Use the injured part only as told by your doctor.  Change bandages (dressings) as told by your doctor.  Keep all doctor visits as told. GET HELP RIGHT AWAY IF:   There is redness, puffiness (swelling), or more pain in the injured finger or toe.  Yellowish-white fluid (pus) comes from the wound.  You have a fever.  A bad smell comes from the wound or bandage.  The wound breaks open.  You cannot move the injured finger or toe. MAKE SURE YOU:   Understand these instructions.  Will watch your condition.  Will get help right away if you are not doing well or get worse. Document Released: 04/06/2010 Document Revised: 01/09/2012 Document Reviewed: 03/04/2011 [facility] Patient Information 2014 [place].     Laceration Care, Adult A laceration is a cut that goes through all layers of the skin. The cut goes into the tissue beneath the skin. HOME CARE For stitches (sutures) or staples:  Keep the cut clean and dry.  If you have a bandage (dressing), change it at least once a day. Change the bandage if it gets wet or dirty, or as told by your doctor.  Wash the cut with soap and water 2 times a day. Rinse the cut with water. Pat it dry with a clean towel.  Put a thin layer of medicated cream on the cut as  told by your doctor.  You may shower after the first 24 hours. Do not soak the cut in water until the stitches are removed.  Only take medicines as told by your doctor.  Have your stitches or staples removed as told by your doctor. For skin adhesive strips:  Keep the cut clean and dry.  Do not get the strips wet. You may take a bath, but be careful to keep the cut dry.  If the cut gets wet, pat it dry with a clean towel.  The strips will fall off on their own. Do not remove the strips that are still stuck to the cut. For wound glue:  You may shower or take baths. Do not soak or scrub the cut. Do not swim. Avoid heavy sweating until the glue falls off on its own. After a shower or bath, pat the cut dry with a clean towel.  Do not put medicine on your cut until the glue falls off.  If you have a bandage, do not put tape over the glue.  Avoid lots of sunlight or tanning lamps until the glue falls off. Put sunscreen on the cut for the first year to reduce your scar.  The glue will fall off on its own. Do not pick at the glue. You may need  a tetanus shot if:  You cannot remember when you had your last tetanus shot.  You have never had a tetanus shot. If you need a tetanus shot and you choose not to have one, you may get tetanus. Sickness from tetanus can be serious. GET HELP RIGHT AWAY IF:   Your pain does not get better with medicine.  Your arm, hand, leg, or foot loses feeling (numbness) or changes color.  Your cut is bleeding.  Your joint feels weak, or you cannot use your joint.  You have painful lumps on your body.  Your cut is red, puffy (swollen), or painful.  You have a red line on the skin near the cut.  You have yellowish-white fluid (pus) coming from the cut.  You have a fever.  You have a bad smell coming from the cut or bandage.  Your cut breaks open before or after stitches are removed.  You notice something coming out of the cut, such as wood or  glass.  You cannot move a finger or toe. MAKE SURE YOU:   Understand these instructions.  Will watch your condition.  Will get help right away if you are not doing well or get worse. Document Released: 04/04/2008 Document Revised: 01/09/2012 Document Reviewed: 04/12/2011 [facility] Patient Information 2014 [place].

## 2013-11-23 NOTE — ED Provider Notes (Signed)
CSN: [id number]     Arrival date & time 11/23/13  2109 History   First MD Initiated Contact with Patient 11/23/13 2152     Chief Complaint  Patient presents with  . Finger Injury  . Hand Pain   HPI  History provided by the patient. Patient is a 48 year old female with history of diabetes who presents with right ring finger injury. Patient reports that she accidentally smashed her right ring finger in the car door earlier today prior to arrival. This caused a small laceration to the pad of the finger tip with bleeding. She reports bleeding was slightly hard to control but did stop with bandage and pressure. She reports slight tingling and numbness sensation to the tip of the finger. Denies any decreased range of motion. Denies any injury or complaints. Her last tetanus shot was 6 years ago. No other aggravating or alleviating factors. No other associated symptoms.    Past Medical History  Diagnosis Date  . Somatic complaints, multiple   . Depressed   . Abnormal abdominal CT scan 01/20/2005    and pelvic CT, normal   . Abdominal ultrasound, abnormal     normal  . History of pelvic ultrasound 12/06    normal   . History of barium enema     normal  . Diabetes mellitus without complication    Past Surgical History  Procedure Laterality Date  . Appendectomy    . Caesarean section      x2  . Laparoscopic cholecystectomy  2009  . Cholecystectomy     Family History  Problem Relation Age of Onset  . Diabetes Mother   . Hypertension Mother   . Throat cancer Father     non-smoker   History  Substance Use Topics  . Smoking status: Never Smoker   . Smokeless tobacco: Not on file  . Alcohol Use: No   OB History   Grav Para Term Preterm Abortions TAB SAB Ect Mult Living                 Review of Systems  Neurological: Negative for weakness.  All other systems reviewed and are negative.    Allergies  Review of patient's allergies indicates no known allergies.  Home  Medications   Current Outpatient Rx  Name  Route  Sig  Dispense  Refill  . albuterol (PROVENTIL HFA;VENTOLIN HFA) 108 (90 BASE) MCG/ACT inhaler   Inhalation   Inhale 1-2 puffs into the lungs every 6 (six) hours as needed for wheezing or shortness of breath.         . metFORMIN (GLUCOPHAGE) 500 MG tablet   Oral   Take 500 mg by mouth 2 (two) times daily with a meal.          BP 145/81  Pulse 81  Temp(Src) 97.6 F (36.4 C) (Oral)  Resp 16  Wt 150 lb 2 oz (68.096 kg)  SpO2 100%  LMP 11/14/2013 Physical Exam  Nursing note and vitals reviewed. Constitutional: She is oriented to person, place, and time. She appears well-developed and well-nourished. No distress.  HENT:  Head: Normocephalic.  Cardiovascular: Normal rate and regular rhythm.   Pulmonary/Chest: Effort normal and breath sounds normal.  Musculoskeletal:  Diffuse swelling to the tip of the right fourth finger. There is a small superficial appearing laceration over the pad of finger. There is a small area of subungual hematoma. Finger is tender throughout. No active bleeding. Patient reports slightly decreased sensation to light touch but does  report light touch intact.  Neurological: She is alert and oriented to person, place, and time.  Skin: Skin is warm and dry. No rash noted.  Psychiatric: She has a normal mood and affect. Her behavior is normal.    ED Course  Procedures     COORDINATION OF CARE:  Nursing notes reviewed. Vital signs reviewed. Initial pt interview and examination performed.   10:01 PM-patient seen and evaluated. She appears well in no acute distress. I discussed options for x-rays to evaluate for possible fractures of phalanx. Patient has refused x-rays. At this time discussed treatment plan a clean the wound and bandaging. Pt agrees with plan.  Patient has superficial laceration to the pad of the finger. No active bleeding. This was thoroughly cleaned and rinsed. Area was dried with a small  amount of bacitracin applied. Steri-Strips were used as well as gauze roll.    MDM   1. Crush injury to finger   2. Laceration of finger        Martie Lee, PA-C 11/23/13 2309

## 2013-11-23 NOTE — ED Notes (Signed)
PA AT BS.  

## 2013-11-23 NOTE — ED Notes (Signed)
Reports finger injury, shut in car door. C/o R 4th digit fingertip pad: pain, scant lac, bruising and bleeding, reports "could not get bleeding to stop", no active bleeding at this time, pt holding self pressure.  Speaks limited English, family present with increased English speaking ability. Pt admits to some pain in hand and wrist.

## 2013-11-24 NOTE — ED Provider Notes (Signed)
Medical screening examination/treatment/procedure(s) were performed by non-physician practitioner and as supervising physician I was immediately available for consultation/collaboration.  EKG Interpretation   None         Blanchard Kelch, MD 11/24/13 [phone]

## 2014-01-05 ENCOUNTER — Emergency Department (INDEPENDENT_AMBULATORY_CARE_PROVIDER_SITE_OTHER)
Admission: EM | Admit: 2014-01-05 | Discharge: 2014-01-05 | Disposition: A | Discharge status: Home / Self Care | Payer: Self-pay | Source: Home / Self Care | Attending: Emergency Medicine | Admitting: Emergency Medicine

## 2014-01-05 ENCOUNTER — Encounter (HOSPITAL_COMMUNITY): Payer: Self-pay | Admitting: Emergency Medicine

## 2014-01-05 DIAGNOSIS — R519 Headache, unspecified: Secondary | ICD-10-CM

## 2014-01-05 DIAGNOSIS — R51 Headache: Secondary | ICD-10-CM

## 2014-01-05 LAB — GLUCOSE, CAPILLARY: Glucose-Capillary: 120 mg/dL — ABNORMAL HIGH (ref 70–99)

## 2014-01-05 MED ORDER — METOCLOPRAMIDE HCL 5 MG/ML IJ SOLN
INTRAMUSCULAR | Status: AC
  Filled 2014-01-05: qty 2

## 2014-01-05 MED ORDER — DIPHENHYDRAMINE HCL 50 MG/ML IJ SOLN
INTRAMUSCULAR | Status: AC
Start: 2014-01-05 — End: 2014-01-05
  Filled 2014-01-05: qty 1

## 2014-01-05 MED ORDER — KETOROLAC TROMETHAMINE 60 MG/2ML IM SOLN
INTRAMUSCULAR | Status: AC
  Filled 2014-01-05: qty 2

## 2014-01-05 MED ORDER — DIPHENHYDRAMINE HCL 50 MG/ML IJ SOLN
50.0000 mg | Freq: Once | INTRAMUSCULAR | Status: AC
  Administered 2014-01-05: 50 mg via INTRAMUSCULAR

## 2014-01-05 MED ORDER — METOCLOPRAMIDE HCL 5 MG/ML IJ SOLN
10.0000 mg | Freq: Once | INTRAMUSCULAR | Status: AC
  Administered 2014-01-05: 10 mg via INTRAMUSCULAR

## 2014-01-05 MED ORDER — KETOROLAC TROMETHAMINE 60 MG/2ML IM SOLN
60.0000 mg | Freq: Once | INTRAMUSCULAR | Status: AC
  Administered 2014-01-05: 60 mg via INTRAMUSCULAR

## 2014-01-05 NOTE — ED Notes (Signed)
Onset last night of severe headache, abdominal pain, no vomiting, no diarrhea.  Does have sensitivity to light.

## 2014-01-05 NOTE — ED Provider Notes (Signed)
CSN: [id number]     Arrival date & time 01/05/14  1259 History   First MD Initiated Contact with Patient 01/05/14 1436     No chief complaint on file.  (Consider location/radiation/quality/duration/timing/severity/associated sxs/prior Treatment) Patient is a 48 y.o. female presenting with headaches. The history is provided by the patient. No language interpreter was used.  Headache Pain location:  Generalized Radiates to:  Does not radiate Severity currently:  9/10 Severity at highest:  9/10 Onset quality:  Gradual Duration:  2 days Timing:  Constant Chronicity:  Recurrent Similar to prior headaches: yes   Context: activity   Associated symptoms: no fever and no focal weakness     Past Medical History  Diagnosis Date  . Somatic complaints, multiple   . Depressed   . Abnormal abdominal CT scan 01/20/2005    and pelvic CT, normal   . Abdominal ultrasound, abnormal     normal  . History of pelvic ultrasound 12/06    normal   . History of barium enema     normal  . Diabetes mellitus without complication    Past Surgical History  Procedure Laterality Date  . Appendectomy    . Caesarean section      x2  . Laparoscopic cholecystectomy  2009  . Cholecystectomy     Family History  Problem Relation Age of Onset  . Diabetes Mother   . Hypertension Mother   . Throat cancer Father     non-smoker   History  Substance Use Topics  . Smoking status: Never Smoker   . Smokeless tobacco: Not on file  . Alcohol Use: No   OB History   Grav Para Term Preterm Abortions TAB SAB Ect Mult Living                 Review of Systems  Constitutional: Negative for fever.  Neurological: Positive for headaches. Negative for focal weakness.  All other systems reviewed and are negative.    Allergies  Review of patient's allergies indicates no known allergies.  Home Medications   Current Outpatient Rx  Name  Route  Sig  Dispense  Refill  . albuterol (PROVENTIL HFA;VENTOLIN HFA) 108  (90 BASE) MCG/ACT inhaler   Inhalation   Inhale 1-2 puffs into the lungs every 6 (six) hours as needed for wheezing or shortness of breath.         . metFORMIN (GLUCOPHAGE) 500 MG tablet   Oral   Take 500 mg by mouth 2 (two) times daily with a meal.          BP 129/67  Pulse 102  Temp(Src) 99.8 F (37.7 C) (Oral)  Resp 16  SpO2 100% Physical Exam  Nursing note and vitals reviewed. Constitutional: She is oriented to person, place, and time. She appears well-developed and well-nourished.  HENT:  Head: Normocephalic and atraumatic.  Right Ear: External ear normal.  Left Ear: External ear normal.  Nose: Nose normal.  Mouth/Throat: Oropharynx is clear and moist.  Eyes: Conjunctivae and EOM are normal. Pupils are equal, round, and reactive to light.  Neck: Normal range of motion.  Cardiovascular: Normal rate and normal heart sounds.   Pulmonary/Chest: Effort normal.  Abdominal: Soft. She exhibits no distension.  Musculoskeletal: Normal range of motion.  Neurological: She is alert and oriented to person, place, and time.  Skin: Skin is warm.  Psychiatric: She has a normal mood and affect.    ED Course  Procedures (including critical care time) Labs Review Labs Reviewed  GLUCOSE, CAPILLARY - Abnormal; Notable for the following:    Glucose-Capillary 120 (*)    All other components within normal limits   Imaging Review No results found.   MDM   Headache Pt feels better after medications.   I advised follow up with your MD tomorrow for recheck  Fransico Meadow, [place] 01/05/14 1625

## 2014-01-05 NOTE — Discharge Instructions (Signed)

## 2014-01-05 NOTE — ED Provider Notes (Signed)
Medical screening examination/treatment/procedure(s) were performed by non-physician practitioner and as supervising physician I was immediately available for consultation/collaboration.  Philipp Deputy, M.D.  Harden Mo, MD 01/05/14 2106

## 2014-01-06 ENCOUNTER — Ambulatory Visit (INDEPENDENT_AMBULATORY_CARE_PROVIDER_SITE_OTHER): Payer: Self-pay | Admitting: Family Medicine

## 2014-01-06 VITALS — BP 130/76 | HR 87 | Temp 98.0°F | Ht 63.0 in | Wt 144.0 lb

## 2014-01-06 DIAGNOSIS — G8929 Other chronic pain: Secondary | ICD-10-CM

## 2014-01-06 DIAGNOSIS — R519 Headache, unspecified: Secondary | ICD-10-CM | POA: Insufficient documentation

## 2014-01-06 DIAGNOSIS — F450 Somatization disorder: Secondary | ICD-10-CM

## 2014-01-06 DIAGNOSIS — R51 Headache: Secondary | ICD-10-CM

## 2014-01-06 DIAGNOSIS — L210 Seborrhea capitis: Secondary | ICD-10-CM

## 2014-01-06 DIAGNOSIS — F419 Anxiety disorder, unspecified: Secondary | ICD-10-CM

## 2014-01-06 DIAGNOSIS — F411 Generalized anxiety disorder: Secondary | ICD-10-CM

## 2014-01-06 DIAGNOSIS — E119 Type 2 diabetes mellitus without complications: Secondary | ICD-10-CM

## 2014-01-06 DIAGNOSIS — L218 Other seborrheic dermatitis: Secondary | ICD-10-CM

## 2014-01-06 HISTORY — DX: Seborrhea capitis: L21.0

## 2014-01-06 HISTORY — DX: Other chronic pain: G89.29

## 2014-01-06 LAB — POCT GLYCOSYLATED HEMOGLOBIN (HGB A1C): HEMOGLOBIN A1C: 6.8

## 2014-01-06 MED ORDER — NAPROXEN 500 MG PO TABS: 500.0000 mg | ORAL_TABLET | Freq: Two times a day (BID) | ORAL | Status: DC

## 2014-01-06 MED ORDER — SELENIUM SULFIDE 1 % EX LOTN: 1.0000 "application " | TOPICAL_LOTION | Freq: Every day | CUTANEOUS | Status: DC

## 2014-01-06 NOTE — Assessment & Plan Note (Signed)
Pt with chronic headaches. Localization varies, CT scan in 2012 showed ethmoidal sinusitis, this has been treated in several occasions. No neurologic focalization on physical exam. We prescribed NSAIDs for her headaches and discuss the value of obtaining blood work and studies to determine etiology. Pt differs them at this time due to financial constraints and would like to postpone them. Discussed signs of worsening condition that should prompt re-evaluation.

## 2014-01-06 NOTE — Assessment & Plan Note (Signed)
Declined PHQ-9 evaluation today. Denies suicidal ideation or plans.

## 2014-01-06 NOTE — Patient Instructions (Addendum)
I have order labs and they are placed as future to get them done when you financial situation is resolved. For now you can take Naproxen as needed for your headache and follow up as needed Topical medication for your scalp has been prescribed Continue taking metformin  Follow up in 4 weeks or sooner if needed

## 2014-01-06 NOTE — Assessment & Plan Note (Addendum)
Mild. No scalp lesion  Topical treatment with selenium sulfide. F/u as needed

## 2014-01-06 NOTE — Assessment & Plan Note (Signed)
A1C at goal. Continue on Metformin (no side effects noted)

## 2014-01-06 NOTE — Progress Notes (Signed)
Family Medicine Office Visit Note   Subjective:   Patient ID: [name], female  DOB: Mar 05, 1966, 48 y.o.. MRN: [id number]   Pt that comes today complaining of headaches. This has been an ongoing issue for years. She reports HA are in central aspect of her head, pulsatile and make her "eyes feel like they are with oil" visual disturbances completely resolve when HA subsides. She reports has symptoms every day from 11:00am to 3:00 pm takes tylenol and this sometimes helps. She denies tingling sensation, numbness or weakness on any area of her body. Also denies dizziness or issues with her ambulation.   Her other concern today is scalp itchiness. This has been present for 2-3 days and denies, visible lesions or pain. No other areas of skin involved. No other family members with same symptoms.  Review of Systems:  Pt denies fever, chills, SOB, chest pain, palpitations. No changes on urinary or BM habits. No unintentional weigh loss/gain.  Objective:   Physical Exam: Gen:  NAD HEENT: Moist mucous membranes  CV: Regular rate and rhythm, no murmurs rubs or gallops PULM: Clear to auscultation bilaterally. No wheezes/rales/rhonchi ABD: Soft, non tender, non distended, normal bowel sounds EXT: No edema Skin: no lesions seen on scalp. Mild dandruff present. Neuro: Alert and oriented x3. Strength is 5/5 in all 4 extremities. CN are intact. Normal sensation. Normal gait and coordination.   Assessment & Plan:

## 2014-01-09 ENCOUNTER — Ambulatory Visit: Payer: Self-pay

## 2014-03-10 ENCOUNTER — Other Ambulatory Visit: Payer: Self-pay | Admitting: *Deleted

## 2014-03-10 DIAGNOSIS — E119 Type 2 diabetes mellitus without complications: Secondary | ICD-10-CM

## 2014-03-10 MED ORDER — METFORMIN HCL 500 MG PO TABS: 500.0000 mg | ORAL_TABLET | Freq: Two times a day (BID) | ORAL | Status: DC

## 2014-05-05 ENCOUNTER — Ambulatory Visit (INDEPENDENT_AMBULATORY_CARE_PROVIDER_SITE_OTHER): Payer: No Typology Code available for payment source | Admitting: Family Medicine

## 2014-05-05 ENCOUNTER — Encounter: Payer: Self-pay | Admitting: Family Medicine

## 2014-05-05 VITALS — BP 128/68 | HR 77 | Temp 99.1°F | Ht 63.0 in | Wt 146.0 lb

## 2014-05-05 DIAGNOSIS — E119 Type 2 diabetes mellitus without complications: Secondary | ICD-10-CM

## 2014-05-05 DIAGNOSIS — R519 Headache, unspecified: Secondary | ICD-10-CM

## 2014-05-05 DIAGNOSIS — R51 Headache: Secondary | ICD-10-CM

## 2014-05-05 DIAGNOSIS — M2550 Pain in unspecified joint: Secondary | ICD-10-CM | POA: Insufficient documentation

## 2014-05-05 HISTORY — DX: Pain in unspecified joint: M25.50

## 2014-05-05 LAB — CBC
HCT: 35.3 % — ABNORMAL LOW (ref 36.0–46.0)
Hemoglobin: 11.9 g/dL — ABNORMAL LOW (ref 12.0–15.0)
MCH: 28.5 pg (ref 26.0–34.0)
MCHC: 33.7 g/dL (ref 30.0–36.0)
MCV: 84.4 fL (ref 78.0–100.0)
PLATELETS: 260 10*3/uL (ref 150–400)
RBC: 4.18 MIL/uL (ref 3.87–5.11)
RDW: 14 % (ref 11.5–15.5)
WBC: 4.4 10*3/uL (ref 4.0–10.5)

## 2014-05-05 LAB — POCT SEDIMENTATION RATE: POCT SED RATE: 40 mm/h — AB (ref 0–22)

## 2014-05-05 LAB — POCT GLYCOSYLATED HEMOGLOBIN (HGB A1C): Hemoglobin A1C: 6.6

## 2014-05-05 MED ORDER — MELOXICAM 15 MG PO TABS: 15.0000 mg | ORAL_TABLET | Freq: Every day | ORAL | Status: DC

## 2014-05-05 NOTE — Assessment & Plan Note (Signed)
Well controlled, continue current medications.  Will obtain CMET today along with micro:creatinine ratio to determine if pt needs low dose ace.  F/U in three months

## 2014-05-05 NOTE — Patient Instructions (Addendum)
Please take the mobic once per day.  Please make an appointment for one month.  Thanks, Dr. Awanda Mink

## 2014-05-05 NOTE — Addendum Note (Signed)
Addended by: Nolon Rod on: 05/05/2014 03:16 PM   Modules accepted: Orders

## 2014-05-05 NOTE — Progress Notes (Signed)
[name] is a 48 y.o. female who presents today for DM II f/u as well as arthralgias.  DM II - Well controlled, takes metformin daily, denies N/V/D.  Arthralgias - Have been ongoing now for the past several yrs (> 10) and states this happens in her knees, elbows, hands, and ankles.  She denies any fever, chills, night sweats, recent travel out of the country, rashes, bug bites.  She is unsure if there is a FHx of autoimmune disease or not.  Describes pain in her joints that is fluctuant throughout the day and is unsure if it is worse in the morning or at night.  She has not tried anything for this and has not noticed if there is any swelling.   Past Medical History  Diagnosis Date  . Somatic complaints, multiple   . Depressed   . Abnormal abdominal CT scan 01/20/2005    and pelvic CT, normal   . Abdominal ultrasound, abnormal     normal  . History of pelvic ultrasound 12/06    normal   . History of barium enema     normal  . Diabetes mellitus without complication     History  Smoking status  . Never Smoker   Smokeless tobacco  . Not on file    Family History  Problem Relation Age of Onset  . Diabetes Mother   . Hypertension Mother   . Throat cancer Father     non-smoker    Current Outpatient Prescriptions on File Prior to Visit  Medication Sig Dispense Refill  . albuterol (PROVENTIL HFA;VENTOLIN HFA) 108 (90 BASE) MCG/ACT inhaler Inhale 1-2 puffs into the lungs every 6 (six) hours as needed for wheezing or shortness of breath.      . metFORMIN (GLUCOPHAGE) 500 MG tablet Take 1 tablet (500 mg total) by mouth 2 (two) times daily with a meal.  60 tablet  3  . naproxen (NAPROSYN) 500 MG tablet Take 1 tablet (500 mg total) by mouth 2 (two) times daily with a meal.  30 tablet  3  . selenium sulfide (SELSUN) 1 % LOTN Apply 1 application topically daily.  240 mL  6   No current facility-administered medications on file prior to visit.    ROS: Per HPI.  All other systems  reviewed and are negative.   Physical Exam Filed Vitals:   05/05/14 1404  BP: 128/68  Pulse: 77  Temp: 99.1 F (37.3 C)    Physical Examination: General appearance - alert, well appearing, and in no distress Chest - clear to auscultation, no wheezes, rales or rhonchi, symmetric air entry Heart - normal rate, regular rhythm, normal S1, S2, no murmurs, rubs, clicks or gallops Neurological - alert, oriented, normal speech, no focal findings or movement disorder noted Musculoskeletal - no joint tenderness, deformity or swelling Skin: + 2 nodules (1 cm diameter) on the L posterior forearm     Chemistry      Component Value Date/Time   NA 140 09/24/2013 1438   K 4.0 09/24/2013 1438   CL 105 09/24/2013 1438   CO2 27 03/13/2013 2259   BUN 13 09/24/2013 1438   CREATININE 0.90 09/24/2013 1438   CREATININE 0.65 01/17/2013 1010      Component Value Date/Time   CALCIUM 9.5 03/13/2013 2259   ALKPHOS 68 03/13/2013 2259   AST 21 03/13/2013 2259   ALT 29 03/13/2013 2259   BILITOT 0.3 03/13/2013 2259      Lab Results  Component Value  Date   WBC 5.6 03/13/2013   HGB 13.9 09/24/2013   HCT 41.0 09/24/2013   MCV 85.6 03/13/2013   PLT 255 03/13/2013   No results found for this basename: TSH   Lab Results  Component Value Date   HGBA1C 6.6 05/05/2014

## 2014-05-05 NOTE — Assessment & Plan Note (Signed)
Of multiple joints that have been ongoing for quite awhile now, without previous w/u.  Will obtain TSH, CBC, ESR, CRP and since pt with possible nodules on L arm, will get ANA/RF/CK to evaluate for autoimmune disorders (Rheumatoid possible with Sx description, if negative RF, would consider anti-ccp) vs Fibromyalgia vs chronic fatigue, etc.  Will need f/u appointment in one month, at which point can establish with female provider.

## 2014-05-06 LAB — MICROALBUMIN / CREATININE URINE RATIO
CREATININE, URINE: 121.7 mg/dL
MICROALB UR: 0.85 mg/dL (ref 0.00–1.89)
Microalb Creat Ratio: 7 mg/g (ref 0.0–30.0)

## 2014-05-06 LAB — COMPREHENSIVE METABOLIC PANEL
ALT: 15 U/L (ref 0–35)
AST: 15 U/L (ref 0–37)
Albumin: 4.2 g/dL (ref 3.5–5.2)
Alkaline Phosphatase: 53 U/L (ref 39–117)
BILIRUBIN TOTAL: 0.3 mg/dL (ref 0.2–1.2)
BUN: 8 mg/dL (ref 6–23)
CO2: 22 mEq/L (ref 19–32)
CREATININE: 0.6 mg/dL (ref 0.50–1.10)
Calcium: 8.4 mg/dL (ref 8.4–10.5)
Chloride: 104 mEq/L (ref 96–112)
Glucose, Bld: 132 mg/dL — ABNORMAL HIGH (ref 70–99)
Potassium: 4.2 mEq/L (ref 3.5–5.3)
Sodium: 135 mEq/L (ref 135–145)
Total Protein: 7.2 g/dL (ref 6.0–8.3)

## 2014-05-06 LAB — ANA: Anti Nuclear Antibody(ANA): NEGATIVE

## 2014-05-06 LAB — CK TOTAL AND CKMB (NOT AT ARMC)
CK, MB: 1.1 ng/mL (ref 0.0–5.0)
Total CK: 89 U/L (ref 7–177)

## 2014-05-06 LAB — C-REACTIVE PROTEIN: CRP: <0.5 mg/dL (ref <0.60)

## 2014-05-06 LAB — PARATHYROID HORMONE, INTACT (NO CA): PTH: 77.3 pg/mL — ABNORMAL HIGH (ref 14.0–72.0)

## 2014-05-06 LAB — TSH: TSH: 3.325 u[IU]/mL (ref 0.350–4.500)

## 2014-05-06 LAB — RHEUMATOID FACTOR

## 2014-05-29 ENCOUNTER — Ambulatory Visit (INDEPENDENT_AMBULATORY_CARE_PROVIDER_SITE_OTHER): Payer: No Typology Code available for payment source | Admitting: Family Medicine

## 2014-05-29 VITALS — BP 131/83 | HR 80 | Temp 98.2°F | Wt 145.0 lb

## 2014-05-29 DIAGNOSIS — M2550 Pain in unspecified joint: Secondary | ICD-10-CM

## 2014-05-29 DIAGNOSIS — N83209 Unspecified ovarian cyst, unspecified side: Secondary | ICD-10-CM

## 2014-05-29 DIAGNOSIS — N912 Amenorrhea, unspecified: Secondary | ICD-10-CM

## 2014-05-29 MED ORDER — IBUPROFEN 600 MG PO TABS: 600.0000 mg | ORAL_TABLET | Freq: Three times a day (TID) | ORAL | Status: DC | PRN

## 2014-05-29 MED ORDER — DULOXETINE HCL 20 MG PO CPEP: 20.0000 mg | ORAL_CAPSULE | Freq: Every day | ORAL | Status: DC

## 2014-05-29 NOTE — Patient Instructions (Signed)
Ms Klus it was great to see you today!  For your irregular cycles it may be that you are nearing menopause But we will look with an ultrasound to evaluate for cysts on your ovaries  For your joint pain please try taking cymbalta daily and ibuprofen as needed for break through Schedule f/up for 4 weeks Let us know if this is not helping  Looking forward to seeing you soon Bernadene Bell, MD

## 2014-05-29 NOTE — Assessment & Plan Note (Signed)
Unclear etiology Extensive workup per Dr. Awanda Mink basically negative Will trial ibuprofen and cymbalta  Potential component of fibromyalgia vs osteoarthritis  (RF negative) Could also potentially biopsy nodules to ensure not autoimmune.

## 2014-05-29 NOTE — Assessment & Plan Note (Signed)
Possibly related to now peri-menopausal sx Did have hx of fibroid in the past No red flags at this time Nml PAP recently Will send for pelvic US F/up closely

## 2014-05-29 NOTE — Progress Notes (Signed)
Patient ID: [name], female   DOB: 1966/06/16, 48 y.o.   MRN: [id number]   [facility] Shelly Bell, MD Phone: [phone]  Subjective:  [name] is a 48 y.o F who presents for routine f/up Wishes to have a female provider only   # Arthralgias -multi-joint pain chronic for years  -considered autoimmune vs fibro vs chronic fatigue at last visit  -today pt c/o bilat knee pain with walking  -also has nodules on arms causing tingling sensation  -no better or worse than usual -is not currently taking anything for it   #Irreg Periods -pt will go at least 3 months without period and then three months on with normal cycles; cycles usually last 6 days, normal amount of bleeding  -additionally has cramping on right side prior to this happening, was told she had a possible cyst in the past  All relevant systems were reviewed and were negative unless otherwise noted in the HPI  Past Medical History Patient Active Problem List   Diagnosis Date Noted  . Arthralgia 05/05/2014  . Chronic headaches 01/06/2014  . Sinusitis, chronic 09/30/2013  . Amenorrhea 03/29/2013  . Anxiety with somatization 03/12/2013  . Diabetes mellitus 01/17/2013  . 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  Medications- reviewed and updated Chief complaint-noted No additions to family history Social history- patient is a never smoker  Objective: BP 131/83  Pulse 80  Temp(Src) 98.2 F (36.8 C) (Oral)  Wt 145 lb (65.772 kg)  LMP 05/10/2014 Gen: NAD, alert, cooperative with exam HEENT: NCAT, EOMI, PERRL, TMs nml Neck: FROM, supple CV: RRR, good S1/S2, no murmur, cap refill <3 Resp: CTABL, no wheezes, non-labored Abd: SNTND apart from right lower quadrant which is tender to palpation, BS present, no guarding or organomegaly Ext: No edema, warm, normal tone, moves UE/LE spontaneously, no joint effusion Neuro: Alert and oriented, No  gross deficits Skin: ganglion like cysts noted on forearm (left)  Assessment/Plan: See problem based a/p

## 2014-06-05 ENCOUNTER — Ambulatory Visit (INDEPENDENT_AMBULATORY_CARE_PROVIDER_SITE_OTHER): Payer: No Typology Code available for payment source | Admitting: Family Medicine

## 2014-06-05 ENCOUNTER — Ambulatory Visit (HOSPITAL_COMMUNITY)
Admission: RE | Admit: 2014-06-05 | Discharge: 2014-06-05 | Disposition: A | Discharge status: Home / Self Care | Payer: No Typology Code available for payment source | Source: Ambulatory Visit | Attending: Family Medicine | Admitting: Family Medicine

## 2014-06-05 ENCOUNTER — Encounter: Payer: Self-pay | Admitting: Family Medicine

## 2014-06-05 VITALS — BP 160/87 | HR 86 | Temp 98.0°F | Wt 145.0 lb

## 2014-06-05 DIAGNOSIS — Q505 Embryonic cyst of broad ligament: Secondary | ICD-10-CM

## 2014-06-05 DIAGNOSIS — N764 Abscess of vulva: Secondary | ICD-10-CM

## 2014-06-05 DIAGNOSIS — N83209 Unspecified ovarian cyst, unspecified side: Secondary | ICD-10-CM

## 2014-06-05 DIAGNOSIS — D259 Leiomyoma of uterus, unspecified: Secondary | ICD-10-CM | POA: Insufficient documentation

## 2014-06-05 DIAGNOSIS — N949 Unspecified condition associated with female genital organs and menstrual cycle: Secondary | ICD-10-CM | POA: Insufficient documentation

## 2014-06-05 DIAGNOSIS — Q504 Embryonic cyst of fallopian tube: Secondary | ICD-10-CM | POA: Insufficient documentation

## 2014-06-05 DIAGNOSIS — N926 Irregular menstruation, unspecified: Secondary | ICD-10-CM | POA: Insufficient documentation

## 2014-06-05 HISTORY — DX: Abscess of vulva: N76.4

## 2014-06-05 MED ORDER — DOXYCYCLINE HYCLATE 100 MG PO TABS: 100.0000 mg | ORAL_TABLET | Freq: Two times a day (BID) | ORAL | Status: DC

## 2014-06-05 NOTE — Patient Instructions (Signed)
Abscess An abscess (boil or furuncle) is an infected area on or under the skin. This area is filled with yellowish-white fluid (pus) and other material (debris). HOME CARE   Only take medicines as told by your doctor.  If you were given antibiotic medicine, take it as directed. Finish the medicine even if you start to feel better.  If gauze is used, follow your doctor's directions for changing the gauze.  To avoid spreading the infection:  Keep your abscess covered with a bandage.  Wash your hands well.  Do not share personal care items, towels, or whirlpools with others.  Avoid skin contact with others.  Keep your skin and clothes clean around the abscess.  Keep all doctor visits as told. GET HELP RIGHT AWAY IF:   You have more pain, puffiness (swelling), or redness in the wound site.  You have more fluid or blood coming from the wound site.  You have muscle aches, chills, or you feel sick.  You have a fever. MAKE SURE YOU:   Understand these instructions.  Will watch your condition.  Will get help right away if you are not doing well or get worse. Document Released: 04/04/2008 Document Revised: 04/17/2012 Document Reviewed: 12/30/2011 [facility] Patient Information 2015 [place], [place]. This information is not intended to replace advice given to you by your health care provider. Make sure you discuss any questions you have with your health care provider.   I have prescribed you in antibiotic to use as directed. Make certain to complete the entire prescription. If the area becomes larger or you have a fever please make an appointment to be seen again. If area does not resolve with antibiotics, you may need to have it drained.

## 2014-06-05 NOTE — Progress Notes (Signed)
   Subjective:    Patient ID: [name], female    DOB: [birth year], 48 y.o.   MRN: [id number]  HPI Patient presented to family medicine same-day clinic.  Labial irritation: Patient reports a small left labial irritation that started on Sunday. She denies any prior history of sexually transmitted diseases or history of abscesses on her body. She has never experienced a labial irritation like this before. Patient denies fevers, chills, nausea, vomiting or abdominal pain. Patient states he rotation is mildly painful. Husband is with her today.Patient's last menstrual period was 05/10/2014.  Review of Systems Per history of present illness    Objective:   Physical Exam BP 160/87  Pulse 86  Temp(Src) 98 F (36.7 C) (Oral)  Wt 145 lb (65.772 kg)  LMP 05/10/2014 Gen: Pleasant. Well-developed, well-nourished, no acute distress, nontoxic in appearance. Abd: Soft. Round. NTND. BS positive. No Masses palpated.  GU: Female circumcision. No erythema, drainage or discharge. Small 2-4 mm abscess left lower labia. Mild tenderness with palpation.    Assessment & Plan:

## 2014-06-05 NOTE — Assessment & Plan Note (Signed)
2-3 mm small labial abscess in the left. No erythema or drainage today. We'll attempt antibiotic use, patient is aware to not attempt to basket and if it becomes more painful red or draining she is to call back in for possible I&D.

## 2014-06-09 ENCOUNTER — Telehealth: Payer: Self-pay | Admitting: Family Medicine

## 2014-06-09 ENCOUNTER — Ambulatory Visit: Payer: No Typology Code available for payment source | Admitting: Family Medicine

## 2014-06-09 DIAGNOSIS — N83201 Unspecified ovarian cyst, right side: Secondary | ICD-10-CM

## 2014-06-09 DIAGNOSIS — D259 Leiomyoma of uterus, unspecified: Secondary | ICD-10-CM

## 2014-06-09 NOTE — Telephone Encounter (Signed)
[place] result IMPRESSION:  1. Small uterine fibroid.  2. Normal endometrium.  3. Small right-sided paraovarian cyst. Normal left ovary.  4. Trace free pelvic fluid. Looks very normal. Potentially sx related to her fibroid. Cramping may be due to her cyst. If she has still having sx could come in to discuss or we could refer to gyne. Can we let her know  [facility], MD

## 2014-06-10 ENCOUNTER — Ambulatory Visit: Payer: No Typology Code available for payment source | Admitting: Family Medicine

## 2014-06-10 NOTE — Telephone Encounter (Signed)
Referral placed, however pt without insurance... [facility], MD

## 2014-06-10 NOTE — Telephone Encounter (Signed)
Pt informed, she would like to be referred to Gynecology.  Pt is aware there is a couple month wait @ [facility] gyn clinic.  Will forward to MD for referral placement. Lameeka Schleifer, Salome Spotted

## 2014-06-12 ENCOUNTER — Telehealth: Payer: Self-pay | Admitting: Family Medicine

## 2014-06-12 ENCOUNTER — Encounter: Payer: Self-pay | Admitting: Obstetrics and Gynecology

## 2014-06-12 NOTE — Telephone Encounter (Signed)
Would like results from ultrasound

## 2014-06-12 NOTE — Telephone Encounter (Signed)
Permission given to speak with husband.  Lm on machine with results of [place] and that we are sending patient to women's.  Naje Rice,CMA

## 2014-06-12 NOTE — Telephone Encounter (Signed)
Pt is aware of results and aware of referral.  She can be seen with the orange card. Ellarae Nevitt,CMA

## 2014-07-10 ENCOUNTER — Encounter: Payer: Self-pay | Admitting: Obstetrics and Gynecology

## 2014-07-10 ENCOUNTER — Ambulatory Visit (INDEPENDENT_AMBULATORY_CARE_PROVIDER_SITE_OTHER): Payer: No Typology Code available for payment source | Admitting: Obstetrics and Gynecology

## 2014-07-10 VITALS — BP 148/88 | HR 81 | Temp 98.0°F | Wt 147.1 lb

## 2014-07-10 DIAGNOSIS — D259 Leiomyoma of uterus, unspecified: Secondary | ICD-10-CM

## 2014-07-10 NOTE — Progress Notes (Signed)
Patient ID: [name], female   DOB: 02-28-1966, 48 y.o.   MRN: [id number] 48 yo [id number] with LMP 06/30/2014 here as a referral from [facility] clinic for evaluation of irregular menses. Patient reports often skipping periods at the most 3 months followed by a regular 6-day period. Patient is also concerned that the fibroid she has is cancerous. Patient is without any complaints.  Past Medical History  Diagnosis Date  . Somatic complaints, multiple   . Depressed   . Abnormal abdominal CT scan 01/20/2005    and pelvic CT, normal   . Abdominal ultrasound, abnormal     normal  . History of pelvic ultrasound 12/06    normal   . History of barium enema     normal  . Diabetes mellitus without complication    Past Surgical History  Procedure Laterality Date  . Appendectomy    . Caesarean section      x2  . Laparoscopic cholecystectomy  2009  . Cholecystectomy     Family History  Problem Relation Age of Onset  . Diabetes Mother   . Hypertension Mother   . Throat cancer Father     non-smoker   History  Substance Use Topics  . Smoking status: Never Smoker   . Smokeless tobacco: Not on file  . Alcohol Use: No   GENERAL: Well-developed, well-nourished female in no acute distress.  ABDOMEN: Soft, nontender, nondistended. No organomegaly. PELVIC: Normal external female genitalia. Vagina is pink and rugated.  Normal discharge. Normal appearing cervix. Uterus is normal in size. No adnexal mass or tenderness. EXTREMITIES: No cyanosis, clubbing, or edema, 2+ distal pulses.  06/05/2014 ultrasound FINDINGS:  Uterus  Measurements: 8.0 x 4.3 x 6.0 cm. Small myometrial fibroid in the  anterior and right aspect of the lower uterine segment. This  measures 1.5 x 1.2 x 1.3 cm.  Endometrium  Thickness: 12.4 mm. No focal abnormality visualized.  Right ovary  Measurements: 1.2 x 1.4 x 1.4 cm. 1.0 x 0.9 x 1.0 cm para ovarian  cyst.  Left ovary  Measurements: 2.2 x 1.8 x 1.1 cm. Normal  appearance/no adnexal mass.  Other findings  Trace free pelvic fluid  IMPRESSION:  1. Small uterine fibroid.  2. Normal endometrium.  3. Small right-sided paraovarian cyst. Normal left ovary.  4. Trace free pelvic fluid.  A/P 48 yo with small fibroid uterus  - Ultrasound results reviewed with the patient - Reassurance provided regarding small fibroid- no intervention needed - Irregular cycles secondary to perimenopausal state- Advised patient to keep a menstrual calendar - No GYN intervention at this time - Follow up with [facility] as scheduled

## 2014-09-01 ENCOUNTER — Encounter: Payer: Self-pay | Admitting: Obstetrics and Gynecology

## 2014-09-01 ENCOUNTER — Ambulatory Visit: Payer: Self-pay

## 2014-09-16 ENCOUNTER — Ambulatory Visit (INDEPENDENT_AMBULATORY_CARE_PROVIDER_SITE_OTHER): Payer: Self-pay | Admitting: Family Medicine

## 2014-09-16 ENCOUNTER — Encounter: Payer: Self-pay | Admitting: Family Medicine

## 2014-09-16 VITALS — BP 161/89 | HR 74 | Temp 97.6°F | Ht 63.0 in | Wt 151.0 lb

## 2014-09-16 DIAGNOSIS — J069 Acute upper respiratory infection, unspecified: Secondary | ICD-10-CM

## 2014-09-16 DIAGNOSIS — J329 Chronic sinusitis, unspecified: Secondary | ICD-10-CM

## 2014-09-16 DIAGNOSIS — E119 Type 2 diabetes mellitus without complications: Secondary | ICD-10-CM

## 2014-09-16 LAB — POCT GLYCOSYLATED HEMOGLOBIN (HGB A1C): Hemoglobin A1C: 7.4

## 2014-09-16 LAB — GLUCOSE, CAPILLARY: Glucose-Capillary: 134 mg/dL — ABNORMAL HIGH (ref 70–99)

## 2014-09-16 MED ORDER — METFORMIN HCL 500 MG PO TABS: 1000.0000 mg | ORAL_TABLET | Freq: Two times a day (BID) | ORAL | Status: DC

## 2014-09-16 NOTE — Patient Instructions (Signed)
Hyperglycemia Hyperglycemia occurs when the glucose (sugar) in your blood is too high. Hyperglycemia can happen for many reasons, but it most often happens to people who do not know they have diabetes or are not managing their diabetes properly.  CAUSES  Whether you have diabetes or not, there are other causes of hyperglycemia. Hyperglycemia can occur when you have diabetes, but it can also occur in other situations that you might not be as aware of, such as: Diabetes  If you have diabetes and are having problems controlling your blood glucose, hyperglycemia could occur because of some of the following reasons:  Not following your meal plan.  Not taking your diabetes medications or not taking it properly.  Exercising less or doing less activity than you normally do.  Being sick. Pre-diabetes  This cannot be ignored. Before people develop Type 2 diabetes, they almost always have "pre-diabetes." This is when your blood glucose levels are higher than normal, but not yet high enough to be diagnosed as diabetes. Research has shown that some long-term damage to the body, especially the heart and circulatory system, may already be occurring during pre-diabetes. If you take action to manage your blood glucose when you have pre-diabetes, you may delay or prevent Type 2 diabetes from developing. Stress  If you have diabetes, you may be "diet" controlled or on oral medications or insulin to control your diabetes. However, you may find that your blood glucose is higher than usual in the hospital whether you have diabetes or not. This is often referred to as "stress hyperglycemia." Stress can elevate your blood glucose. This happens because of hormones put out by the body during times of stress. If stress has been the cause of your high blood glucose, it can be followed regularly by your caregiver. That way he/she can make sure your hyperglycemia does not continue to get worse or progress to  diabetes. Steroids  Steroids are medications that act on the infection fighting system (immune system) to block inflammation or infection. One side effect can be a rise in blood glucose. Most people can produce enough extra insulin to allow for this rise, but for those who cannot, steroids make blood glucose levels go even higher. It is not unusual for steroid treatments to "uncover" diabetes that is developing. It is not always possible to determine if the hyperglycemia will go away after the steroids are stopped. A special blood test called an A1c is sometimes done to determine if your blood glucose was elevated before the steroids were started. SYMPTOMS  Thirsty.  Frequent urination.  Dry mouth.  Blurred vision.  Tired or fatigue.  Weakness.  Sleepy.  Tingling in feet or leg. DIAGNOSIS  Diagnosis is made by monitoring blood glucose in one or all of the following ways:  A1c test. This is a chemical found in your blood.  Fingerstick blood glucose monitoring.  Laboratory results. TREATMENT  First, knowing the cause of the hyperglycemia is important before the hyperglycemia can be treated. Treatment may include, but is not be limited to:  Education.  Change or adjustment in medications.  Change or adjustment in meal plan.  Treatment for an illness, infection, etc.  More frequent blood glucose monitoring.  Change in exercise plan.  Decreasing or stopping steroids.  Lifestyle changes. HOME CARE INSTRUCTIONS   Test your blood glucose as directed.  Exercise regularly. Your caregiver will give you instructions about exercise. Pre-diabetes or diabetes which comes on with stress is helped by exercising.  Eat wholesome,   balanced meals. Eat often and at regular, fixed times. Your caregiver or nutritionist will give you a meal plan to guide your sugar intake.  Being at an ideal weight is important. If needed, losing as little as 10 to 15 pounds may help improve blood  glucose levels. SEEK MEDICAL CARE IF:   You have questions about medicine, activity, or diet.  You continue to have symptoms (problems such as increased thirst, urination, or weight gain). SEEK IMMEDIATE MEDICAL CARE IF:   You are vomiting or have diarrhea.  Your breath smells fruity.  You are breathing faster or slower.  You are very sleepy or incoherent.  You have numbness, tingling, or pain in your feet or hands.  You have chest pain.  Your symptoms get worse even though you have been following your caregiver's orders.  If you have any other questions or concerns. Document Released: 04/12/2001 Document Revised: 01/09/2012 Document Reviewed: 02/13/2012 [facility] Patient Information 2015 [place], [place]. This information is not intended to replace advice given to you by your health care provider. Make sure you discuss any questions you have with your health care provider.   Thanks for coming in today.   If you begin to have side effects from your increased dose of metformin, then please return to be seen at the clinic.   If you experience any dizziness, feeling faint, headache, vision changes, nausea, or vomiting, then please go to the ER to be evaluated as you may have dangerously elevated blood sugar.   Thanks for letting us take care of you.   Sincerely, Paula Compton, MD Family Medicine - PGY 1

## 2014-09-17 NOTE — Progress Notes (Signed)
Patient ID: [name], female   DOB: 1966/01/02, 48 y.o.   MRN: [id number]   [facility] Aquilla Hacker, MD Phone: [phone]  Subjective:   # Hyperglycemia 2/2 DM II - Pt. Here for same day appointment due to episodes of hyperglycemia at home. Measured on poc CBG to be in 200's / 300 with pt. Symptomatic from hyperglycemia.  - She has been taking metformin 500mg  BID and has been compliant. She denies side effects from this medication.  - She denies vision changes, tingling, numbness, or weakness, nausea, vomiting, diarrhea.  - Additionally, she has also worked to improve her diet.  - Denies polydypsia, polyuria, or polyphagia at this time.  - Concerned over her blood sugar and exhibits some anxiety with regard to the disease process.   # Upper Respiratory Infection  - Pt. With cough / sneezing, mild runny nose, scratchy throat, watery eyes , and intermittent mild headache x 5 days.  - She denies fever, chills, chest pain, SOB, nausea, vomiting, purulent nasal discharge, or ear pain.  - She denies sick contacts.  - She reports some improvement with advil.   All relevant systems were reviewed and were negative unless otherwise noted in the HPI  Past Medical History Reviewed problem list.  Medications- reviewed and updated Current Outpatient Prescriptions  Medication Sig Dispense Refill  . albuterol (PROVENTIL HFA;VENTOLIN HFA) 108 (90 BASE) MCG/ACT inhaler Inhale 1-2 puffs into the lungs every 6 (six) hours as needed for wheezing or shortness of breath.    . doxycycline (VIBRA-TABS) 100 MG tablet Take 1 tablet (100 mg total) by mouth 2 (two) times daily. 20 tablet 0  . ibuprofen (ADVIL,MOTRIN) 600 MG tablet Take 1 tablet (600 mg total) by mouth every 8 (eight) hours as needed. 30 tablet 0  . meloxicam (MOBIC) 15 MG tablet Take 1 tablet (15 mg total) by mouth daily. 30 tablet 1  . metFORMIN (GLUCOPHAGE) 500 MG tablet Take 2 tablets (1,000 mg total) by  mouth 2 (two) times daily with a meal. 60 tablet 5  . selenium sulfide (SELSUN) 1 % LOTN Apply 1 application topically daily. 240 mL 6   No current facility-administered medications for this visit.   Chief complaint-noted No additions to family history Social history- patient is a non smoker  Objective: BP 161/89 mmHg  Pulse 74  Temp(Src) 97.6 F (36.4 C) (Oral)  Ht 5\' 3"  (1.6 m)  Wt 151 lb (68.493 kg)  BMI 26.76 kg/m2  LMP 06/02/2014 Gen: NAD, alert, cooperative with exam HEENT: NCAT, EOMI, PERRL, TMs nml, no cervical LAD, no facial TTP, oropharynx moist and minimally erythematous, no purulent discharge noted or tonsilar swelling.  Neck: FROM, supple, no LAD CV: RRR, good S1/S2, no murmur Resp: CTABL, no wheezes, non-labored Abd: SNTND, BS present, no guarding or organomegaly Ext: No edema, warm, normal tone, moves UE/LE spontaneously Neuro: Alert and oriented, No gross deficits, full strength Skin: no rashes no lesions  Assessment/Plan: See problem based a/p

## 2014-09-17 NOTE — Assessment & Plan Note (Signed)
A: currently with ongoing sneezing / coughing / mild nasal discharge and intermittent headache. Watery eyes as well. Consistent with viral URI. No fever chills, or purulent discharge.   P:  - Supportive care - Recommended NSAID's for antiinflammatory properties and to reduce swelling in sinus passages.  - Recommended hydration.  - Return if worsening symptoms, or fever, nausea, vomiting, or difficulty breathing develop.

## 2014-09-17 NOTE — Assessment & Plan Note (Signed)
A: Pt. With complaints of symptomatic hyperglycemia at home. A1C here 7.4 from 6.6 previously support this. CBG 134 today. Mostly well controlled, but concerned that she may be experiencing less control of her DMII. She also has had URI recently and abscess back in august. She may have poor glucose control as a result of these recent infections. At any rate, will titrate up metformin for better glucose control at this point.   P:  - Metformin titrated up to 1000 mg BID.  - Instructed of potential for GI side effects.  - Last creatinine in September within range.  - Recommended recording glucose in log at home to bring back to PCP at her next follow up appointment.  - Return for further evaluation of glucose control in 2 months, and sooner if symptomatic.

## 2014-12-08 ENCOUNTER — Encounter (HOSPITAL_COMMUNITY): Payer: Self-pay | Admitting: *Deleted

## 2014-12-08 ENCOUNTER — Emergency Department (INDEPENDENT_AMBULATORY_CARE_PROVIDER_SITE_OTHER)
Admission: EM | Admit: 2014-12-08 | Discharge: 2014-12-08 | Disposition: A | Discharge status: Home / Self Care | Payer: 59 | Source: Home / Self Care | Attending: Family Medicine | Admitting: Family Medicine

## 2014-12-08 DIAGNOSIS — J208 Acute bronchitis due to other specified organisms: Secondary | ICD-10-CM

## 2014-12-08 MED ORDER — IPRATROPIUM-ALBUTEROL 0.5-2.5 (3) MG/3ML IN SOLN
3.0000 mL | Freq: Once | RESPIRATORY_TRACT | Status: AC
  Administered 2014-12-08: 3 mL via RESPIRATORY_TRACT

## 2014-12-08 MED ORDER — TRAMADOL HCL 50 MG PO TABS: 50.0000 mg | ORAL_TABLET | Freq: Four times a day (QID) | ORAL | Status: DC | PRN

## 2014-12-08 MED ORDER — IPRATROPIUM-ALBUTEROL 0.5-2.5 (3) MG/3ML IN SOLN
RESPIRATORY_TRACT | Status: AC
  Filled 2014-12-08: qty 3

## 2014-12-08 NOTE — Discharge Instructions (Signed)
Thank you for coming in today. °Call or go to the emergency room if you get worse, have trouble breathing, have chest pains, or palpitations.  ° °Acute Bronchitis °Bronchitis is inflammation of the airways that extend from the windpipe into the lungs (bronchi). The inflammation often causes mucus to develop. This leads to a cough, which is the most common symptom of bronchitis.  °In acute bronchitis, the condition usually develops suddenly and goes away over time, usually in a couple weeks. Smoking, allergies, and asthma can make bronchitis worse. Repeated episodes of bronchitis may cause further lung problems.  °CAUSES °Acute bronchitis is most often caused by the same virus that causes a cold. The virus can spread from person to person (contagious) through coughing, sneezing, and touching contaminated objects. °SIGNS AND SYMPTOMS  °· Cough.   °· Fever.   °· Coughing up mucus.   °· Body aches.   °· Chest congestion.   °· Chills.   °· Shortness of breath.   °· Sore throat.   °DIAGNOSIS  °Acute bronchitis is usually diagnosed through a physical exam. Your health care provider will also ask you questions about your medical history. Tests, such as chest X-rays, are sometimes done to rule out other conditions.  °TREATMENT  °Acute bronchitis usually goes away in a couple weeks. Oftentimes, no medical treatment is necessary. Medicines are sometimes given for relief of fever or cough. Antibiotic medicines are usually not needed but may be prescribed in certain situations. In some cases, an inhaler may be recommended to help reduce shortness of breath and control the cough. A cool mist vaporizer may also be used to help thin bronchial secretions and make it easier to clear the chest.  °HOME CARE INSTRUCTIONS °· Get plenty of rest.   °· Drink enough fluids to keep your urine clear or pale yellow (unless you have a medical condition that requires fluid restriction). Increasing fluids may help thin your respiratory secretions  (sputum) and reduce chest congestion, and it will prevent dehydration.   °· Take medicines only as directed by your health care provider. °· If you were prescribed an antibiotic medicine, finish it all even if you start to feel better. °· Avoid smoking and secondhand smoke. Exposure to cigarette smoke or irritating chemicals will make bronchitis worse. If you are a smoker, consider using nicotine gum or skin patches to help control withdrawal symptoms. Quitting smoking will help your lungs heal faster.   °· Reduce the chances of another bout of acute bronchitis by washing your hands frequently, avoiding people with cold symptoms, and trying not to touch your hands to your mouth, nose, or eyes.   °· Keep all follow-up visits as directed by your health care provider.   °SEEK MEDICAL CARE IF: °Your symptoms do not improve after 1 week of treatment.  °SEEK IMMEDIATE MEDICAL CARE IF: °· You develop an increased fever or chills.   °· You have chest pain.   °· You have severe shortness of breath. °· You have bloody sputum.   °· You develop dehydration. °· You faint or repeatedly feel like you are going to pass out. °· You develop repeated vomiting. °· You develop a severe headache. °MAKE SURE YOU:  °· Understand these instructions. °· Will watch your condition. °· Will get help right away if you are not doing well or get worse. °Document Released: 11/24/2004 Document Revised: 03/03/2014 Document Reviewed: 04/09/2013 °[employer]® Patient Information ©2015 [employer]. This information is not intended to replace advice given to you by your health care provider. Make sure you discuss any questions you have with your   health care provider. ° °

## 2014-12-08 NOTE — ED Notes (Signed)
Pt  Has  Symptoms  Of   sorethroat       With    -  Cough  -        Congestion  And  Pain  Associated  With  The  cough   X  sev  Days     Pt  Is  A  Diabetic

## 2014-12-08 NOTE — ED Provider Notes (Signed)
[name] is a 49 y.o. female who presents to Urgent Care today for cough sore throat and chest congestion. Symptoms present for about 3 days. Patient has not tried any medications yet. Patient feels well otherwise. No chest pains or palpitations.   Past Medical History  Diagnosis Date  . Somatic complaints, multiple   . Depressed   . Abnormal abdominal CT scan 01/20/2005    and pelvic CT, normal   . Abdominal ultrasound, abnormal     normal  . History of pelvic ultrasound 12/06    normal   . History of barium enema     normal  . Diabetes mellitus without complication    Past Surgical History  Procedure Laterality Date  . Appendectomy    . Caesarean section      x2  . Laparoscopic cholecystectomy  2009  . Cholecystectomy     History  Substance Use Topics  . Smoking status: Never Smoker   . Smokeless tobacco: Not on file  . Alcohol Use: No   ROS as above Medications: No current facility-administered medications for this encounter.   Current Outpatient Prescriptions  Medication Sig Dispense Refill  . albuterol (PROVENTIL HFA;VENTOLIN HFA) 108 (90 BASE) MCG/ACT inhaler Inhale 1-2 puffs into the lungs every 6 (six) hours as needed for wheezing or shortness of breath.    Marland Kitchen ibuprofen (ADVIL,MOTRIN) 600 MG tablet Take 1 tablet (600 mg total) by mouth every 8 (eight) hours as needed. 30 tablet 0  . meloxicam (MOBIC) 15 MG tablet Take 1 tablet (15 mg total) by mouth daily. 30 tablet 1  . metFORMIN (GLUCOPHAGE) 500 MG tablet Take 2 tablets (1,000 mg total) by mouth 2 (two) times daily with a meal. 60 tablet 5  . selenium sulfide (SELSUN) 1 % LOTN Apply 1 application topically daily. 240 mL 6  . traMADol (ULTRAM) 50 MG tablet Take 1 tablet (50 mg total) by mouth every 6 (six) hours as needed (cough or pain). 15 tablet 0   No Known Allergies   Exam:  BP 126/65 mmHg  Pulse 74  Temp(Src) 98.1 F (36.7 C) (Oral)  Resp 16  SpO2 100%  LMP 12/01/2014 Gen: Well NAD HEENT:  EOMI,  MMM, posterior pharynx mildly erythematous. Normal tympanic membranes bilaterally. Lungs: Normal work of breathing. CTABL Heart: RRR no MRG Abd: NABS, Soft. Nondistended, Nontender Exts: Brisk capillary refill, warm and well perfused.   Patient was given a 2.5/0.5 mg DuoNeb nebulizer treatment, and felt a little better.  POC rapid strep test negative.    No results found for this or any previous visit (from the past 24 hour(s)). No results found.  Assessment and Plan: 49 y.o. female with: Viral URI with component of viral bronchitis. Plan to continue albuterol. Use tramadol for cough and pain. Follow-up with PCP. Throat culture pending.   discussed warning signs or symptoms. Please see discharge instructions. Patient expresses understanding.     Gregor Hams, MD 12/08/14 [phone]

## 2014-12-09 LAB — POCT RAPID STREP A: Streptococcus, Group A Screen (Direct): NEGATIVE

## 2014-12-10 LAB — CULTURE, GROUP A STREP

## 2015-01-06 ENCOUNTER — Emergency Department (HOSPITAL_COMMUNITY): Payer: 59

## 2015-01-06 ENCOUNTER — Encounter (HOSPITAL_COMMUNITY): Payer: Self-pay

## 2015-01-06 ENCOUNTER — Encounter: Payer: Self-pay | Admitting: Internal Medicine

## 2015-01-06 ENCOUNTER — Emergency Department (HOSPITAL_COMMUNITY)
Admission: EM | Admit: 2015-01-06 | Discharge: 2015-01-06 | Disposition: A | Discharge status: Home / Self Care | Payer: 59 | Attending: Emergency Medicine | Admitting: Emergency Medicine

## 2015-01-06 DIAGNOSIS — F329 Major depressive disorder, single episode, unspecified: Secondary | ICD-10-CM | POA: Insufficient documentation

## 2015-01-06 DIAGNOSIS — E119 Type 2 diabetes mellitus without complications: Secondary | ICD-10-CM | POA: Insufficient documentation

## 2015-01-06 DIAGNOSIS — Z9889 Other specified postprocedural states: Secondary | ICD-10-CM | POA: Diagnosis not present

## 2015-01-06 DIAGNOSIS — Z79899 Other long term (current) drug therapy: Secondary | ICD-10-CM | POA: Diagnosis not present

## 2015-01-06 DIAGNOSIS — Z9049 Acquired absence of other specified parts of digestive tract: Secondary | ICD-10-CM | POA: Insufficient documentation

## 2015-01-06 DIAGNOSIS — Z3202 Encounter for pregnancy test, result negative: Secondary | ICD-10-CM | POA: Insufficient documentation

## 2015-01-06 DIAGNOSIS — R109 Unspecified abdominal pain: Secondary | ICD-10-CM | POA: Insufficient documentation

## 2015-01-06 LAB — COMPREHENSIVE METABOLIC PANEL
ALK PHOS: 59 U/L (ref 39–117)
ALT: 30 U/L (ref 0–35)
AST: 28 U/L (ref 0–37)
Albumin: 3.7 g/dL (ref 3.5–5.2)
Anion gap: 10 (ref 5–15)
BUN: 10 mg/dL (ref 6–23)
CO2: 20 mmol/L (ref 19–32)
CREATININE: 0.6 mg/dL (ref 0.50–1.10)
Calcium: 9.4 mg/dL (ref 8.4–10.5)
Chloride: 106 mmol/L (ref 96–112)
GFR calc Af Amer: >90 mL/min (ref >90)
Glucose, Bld: 113 mg/dL — ABNORMAL HIGH (ref 70–99)
Potassium: 4 mmol/L (ref 3.5–5.1)
Sodium: 136 mmol/L (ref 135–145)
Total Bilirubin: 0.4 mg/dL (ref 0.3–1.2)
Total Protein: 7.3 g/dL (ref 6.0–8.3)

## 2015-01-06 LAB — CBC WITH DIFFERENTIAL/PLATELET
BASOS ABS: 0 10*3/uL (ref 0.0–0.1)
Basophils Relative: 0 % (ref 0–1)
EOS ABS: 0.3 10*3/uL (ref 0.0–0.7)
EOS PCT: 4 % (ref 0–5)
HEMATOCRIT: 36 % (ref 36.0–46.0)
Hemoglobin: 12.2 g/dL (ref 12.0–15.0)
Lymphocytes Relative: 33 % (ref 12–46)
Lymphs Abs: 2.2 10*3/uL (ref 0.7–4.0)
MCH: 29.3 pg (ref 26.0–34.0)
MCHC: 33.9 g/dL (ref 30.0–36.0)
MCV: 86.3 fL (ref 78.0–100.0)
Monocytes Absolute: 0.7 10*3/uL (ref 0.1–1.0)
Monocytes Relative: 10 % (ref 3–12)
NEUTROS ABS: 3.5 10*3/uL (ref 1.7–7.7)
Neutrophils Relative %: 53 % (ref 43–77)
PLATELETS: 349 10*3/uL (ref 150–400)
RBC: 4.17 MIL/uL (ref 3.87–5.11)
RDW: 13.2 % (ref 11.5–15.5)
WBC: 6.6 10*3/uL (ref 4.0–10.5)

## 2015-01-06 LAB — POC URINE PREG, ED: Preg Test, Ur: NEGATIVE

## 2015-01-06 MED ORDER — MORPHINE SULFATE 4 MG/ML IJ SOLN
4.0000 mg | Freq: Once | INTRAMUSCULAR | Status: DC
  Filled 2015-01-06: qty 1

## 2015-01-06 MED ORDER — FAMOTIDINE 20 MG PO TABS: 20.0000 mg | ORAL_TABLET | Freq: Two times a day (BID) | ORAL | Status: DC

## 2015-01-06 MED ORDER — IOHEXOL 300 MG/ML  SOLN
100.0000 mL | Freq: Once | INTRAMUSCULAR | Status: AC | PRN
  Administered 2015-01-06: 100 mL via INTRAVENOUS

## 2015-01-06 MED ORDER — OXYCODONE-ACETAMINOPHEN 5-325 MG PO TABS: 1.0000 | ORAL_TABLET | ORAL | Status: DC | PRN

## 2015-01-06 MED ORDER — IOHEXOL 300 MG/ML  SOLN
25.0000 mL | Freq: Once | INTRAMUSCULAR | Status: AC | PRN
  Administered 2015-01-06: 25 mL via ORAL

## 2015-01-06 NOTE — ED Notes (Signed)
Pt gone for CT 

## 2015-01-06 NOTE — ED Provider Notes (Signed)
CSN: [id number]     Arrival date & time 01/06/15  1022 History   First MD Initiated Contact with Patient 01/06/15 1046     Chief Complaint  Patient presents with  . Abdominal Pain  . Flank Pain  . Facial Swelling     (Consider location/radiation/quality/duration/timing/severity/associated sxs/prior Treatment) HPI Comments: PT comes in with cc of abd pain. Pt has hx of NIDDM and also cholecystectomy, csection, appendectomy hx. She reports abd pain x 2 weeks. Pain is constant, with waxing and waning nature, with no specific aggravating or relieving factors. No n/v/f/c. She has hot flashes like sx. No uti like sx, no diarrhea. PO intake is normal.   ROS 10 Systems reviewed and are negative for acute change except as noted in the HPI.     Patient is a 49 y.o. female presenting with abdominal pain and flank pain. The history is provided by the patient.  Abdominal Pain Flank Pain Associated symptoms include abdominal pain.    Past Medical History  Diagnosis Date  . Somatic complaints, multiple   . Depressed   . Abnormal abdominal CT scan 01/20/2005    and pelvic CT, normal   . Abdominal ultrasound, abnormal     normal  . History of pelvic ultrasound 12/06    normal   . History of barium enema     normal  . Diabetes mellitus without complication    Past Surgical History  Procedure Laterality Date  . Appendectomy    . Caesarean section      x2  . Laparoscopic cholecystectomy  2009  . Cholecystectomy     Family History  Problem Relation Age of Onset  . Diabetes Mother   . Hypertension Mother   . Throat cancer Father     non-smoker   History  Substance Use Topics  . Smoking status: Never Smoker   . Smokeless tobacco: Not on file  . Alcohol Use: No   OB History    Gravida Para Term Preterm AB TAB SAB Ectopic Multiple Living   4 3 3  1   1  3      Review of Systems  Gastrointestinal: Positive for abdominal pain.  Genitourinary: Positive for flank pain.       Allergies  Review of patient's allergies indicates no known allergies.  Home Medications   Prior to Admission medications   Medication Sig Start Date End Date Taking? Authorizing Provider  metFORMIN (GLUCOPHAGE) 500 MG tablet Take 2 tablets (1,000 mg total) by mouth 2 (two) times daily with a meal. 09/16/14  Yes Aquilla Hacker, MD  albuterol (PROVENTIL HFA;VENTOLIN HFA) 108 (90 BASE) MCG/ACT inhaler Inhale 1-2 puffs into the lungs every 6 (six) hours as needed for wheezing or shortness of breath.    Historical Provider, MD  famotidine (PEPCID) 20 MG tablet Take 1 tablet (20 mg total) by mouth 2 (two) times daily. 01/06/15   Varney Biles, MD  ibuprofen (ADVIL,MOTRIN) 600 MG tablet Take 1 tablet (600 mg total) by mouth every 8 (eight) hours as needed. Patient not taking: Reported on 01/06/2015 05/29/14   Bernadene Bell, MD  meloxicam (MOBIC) 15 MG tablet Take 1 tablet (15 mg total) by mouth daily. Patient not taking: Reported on 01/06/2015 05/05/14   Nolon Rod, DO  oxyCODONE-acetaminophen (ROXICET) 5-325 MG per tablet Take 1 tablet by mouth every 4 (four) hours as needed for severe pain. 01/06/15   Varney Biles, MD  selenium sulfide (SELSUN) 1 % LOTN Apply 1 application  topically daily. Patient not taking: Reported on 01/06/2015 01/06/14   Dayarmys Piloto de Gwendalyn Ege, MD  traMADol (ULTRAM) 50 MG tablet Take 1 tablet (50 mg total) by mouth every 6 (six) hours as needed (cough or pain). Patient not taking: Reported on 01/06/2015 12/08/14   Gregor Hams, MD   BP 132/69 mmHg  Pulse 86  Temp(Src) 97.8 F (36.6 C) (Oral)  Resp 16  Ht 5\' 7"  (1.702 m)  Wt 146 lb (66.225 kg)  BMI 22.86 kg/m2  SpO2 99%  LMP 12/01/2014 Physical Exam  Constitutional: She is oriented to person, place, and time. She appears well-developed and well-nourished.  HENT:  Head: Normocephalic and atraumatic.  Eyes: EOM are normal. Pupils are equal, round, and reactive to light.  Neck: Neck supple.  Cardiovascular: Normal  rate, regular rhythm and normal heart sounds.   No murmur heard. Pulmonary/Chest: Effort normal. No respiratory distress.  Abdominal: Soft. She exhibits no distension. There is tenderness. There is no rebound and no guarding.  Pt has right sided tenderness - at the level of the umbilicus and also in the flank region. No rash.  Neurological: She is alert and oriented to person, place, and time.  Skin: Skin is warm and dry.  Nursing note and vitals reviewed.   ED Course  Procedures (including critical care time) Labs Review Labs Reviewed  COMPREHENSIVE METABOLIC PANEL - Abnormal; Notable for the following:    Glucose, Bld 113 (*)    All other components within normal limits  CBC WITH DIFFERENTIAL/PLATELET  PREGNANCY, URINE  POC URINE PREG, ED    Imaging Review Ct Abdomen Pelvis W Contrast  01/06/2015   CLINICAL DATA:  Right lower abdominal pain, right lower quadrant pain. Prior cholecystectomy and appendectomy.  EXAM: CT ABDOMEN AND PELVIS WITH CONTRAST  TECHNIQUE: Multidetector CT imaging of the abdomen and pelvis was performed using the standard protocol following bolus administration of intravenous contrast.  CONTRAST:  134mL OMNIPAQUE IOHEXOL 300 MG/ML  SOLN  COMPARISON:  Ultrasound of the pelvis 06/05/2014.  CT 05/10/2010.  FINDINGS: Lung bases are clear.  No effusions.  Heart is normal size.  Mild diffuse fatty infiltration of the liver. Prior cholecystectomy. Spleen, pancreas, adrenals and kidneys are unremarkable.  Stomach, large and small bowel are unremarkable. Reported prior appendectomy. Wide-mouth umbilical hernia containing fat. Uterus, adnexae and urinary bladder are unremarkable. Aorta is normal caliber. No free fluid, free air or adenopathy.  No acute bony abnormality or focal bone lesion.  IMPRESSION: Diffuse fatty infiltration of the liver.  No acute findings in the abdomen or pelvis.   Electronically Signed   By: Rolm Baptise M.D.   On: 01/06/2015 15:02     EKG  Interpretation None      MDM   Final diagnoses:  Right-sided abdominal pain of unknown cause  Flank pain, acute    PT comes in with cc of abd pain.  DDx includes: Pancreatitis Hepatobiliary pathology including cholecystitis Gastritis/PUD SBO ACS syndrome Aortic Dissection  Pt's pain is right sided, atypical, constant x 2 weeks. She is diabetic, and from [place]. CT was completed, and there are no signs of infection, abscess, mass, thrombosis. Pt has fatty liver. Thinking pt will benefit from GI f/u, for fatty liver, and this right sided pain that can be hepatobiliary in etiology.  Varney Biles, MD 01/06/15 1538

## 2015-01-06 NOTE — ED Notes (Signed)
Patient returned from CT

## 2015-01-06 NOTE — ED Notes (Signed)
Pt reports right flank and abd pain and eye swelling, onset gradual over the last several days. sts she feels hot sometimes and attempts to drink more water.

## 2015-01-06 NOTE — ED Notes (Signed)
CT made aware patient's has finished contrast.

## 2015-01-06 NOTE — ED Notes (Signed)
NAD at this time . Pt is stable and leaving with her husband and children.Marland Kitchen

## 2015-01-06 NOTE — Discharge Instructions (Signed)
We saw you in the ER for the abdominal pain. All of our results are normal, including all labs and imaging. Kidney function is fine as well. THE LIVER SHOWS FATTY INFILTRATION. We are not sure what is causing your abdominal pain, and recommend that you see a GI doctor within 2-3 days for further evaluation. If your symptoms get worse, return to the ER. Take the pain meds and nausea meds as prescribed.   Abdominal Pain, Women Abdominal (stomach, pelvic, or belly) pain can be caused by many things. It is important to tell your doctor:  The location of the pain.  Does it come and go or is it present all the time?  Are there things that start the pain (eating certain foods, exercise)?  Are there other symptoms associated with the pain (fever, nausea, vomiting, diarrhea)? All of this is helpful to know when trying to find the cause of the pain. CAUSES   Stomach: virus or bacteria infection, or ulcer.  Intestine: appendicitis (inflamed appendix), regional ileitis (Crohn's disease), ulcerative colitis (inflamed colon), irritable bowel syndrome, diverticulitis (inflamed diverticulum of the colon), or cancer of the stomach or intestine.  Gallbladder disease or stones in the gallbladder.  Kidney disease, kidney stones, or infection.  Pancreas infection or cancer.  Fibromyalgia (pain disorder).  Diseases of the female organs:  Uterus: fibroid (non-cancerous) tumors or infection.  Fallopian tubes: infection or tubal pregnancy.  Ovary: cysts or tumors.  Pelvic adhesions (scar tissue).  Endometriosis (uterus lining tissue growing in the pelvis and on the pelvic organs).  Pelvic congestion syndrome (female organs filling up with blood just before the menstrual period).  Pain with the menstrual period.  Pain with ovulation (producing an egg).  Pain with an IUD (intrauterine device, birth control) in the uterus.  Cancer of the female organs.  Functional pain (pain not caused by  a disease, may improve without treatment).  Psychological pain.  Depression. DIAGNOSIS  Your doctor will decide the seriousness of your pain by doing an examination.  Blood tests.  X-rays.  Ultrasound.  CT scan (computed tomography, special type of X-ray).  MRI (magnetic resonance imaging).  Cultures, for infection.  Barium enema (dye inserted in the large intestine, to better view it with X-rays).  Colonoscopy (looking in intestine with a lighted tube).  Laparoscopy (minor surgery, looking in abdomen with a lighted tube).  Major abdominal exploratory surgery (looking in abdomen with a large incision). TREATMENT  The treatment will depend on the cause of the pain.   Many cases can be observed and treated at home.  Over-the-counter medicines recommended by your caregiver.  Prescription medicine.  Antibiotics, for infection.  Birth control pills, for painful periods or for ovulation pain.  Hormone treatment, for endometriosis.  Nerve blocking injections.  Physical therapy.  Antidepressants.  Counseling with a psychologist or psychiatrist.  Minor or major surgery. HOME CARE INSTRUCTIONS   Do not take laxatives, unless directed by your caregiver.  Take over-the-counter pain medicine only if ordered by your caregiver. Do not take aspirin because it can cause an upset stomach or bleeding.  Try a clear liquid diet (broth or water) as ordered by your caregiver. Slowly move to a bland diet, as tolerated, if the pain is related to the stomach or intestine.  Have a thermometer and take your temperature several times a day, and record it.  Bed rest and sleep, if it helps the pain.  Avoid sexual intercourse, if it causes pain.  Avoid stressful situations.  Keep  your follow-up appointments and tests, as your caregiver orders.  If the pain does not go away with medicine or surgery, you may try:  Acupuncture.  Relaxation exercises (yoga, meditation).  Group  therapy.  Counseling. SEEK MEDICAL CARE IF:   You notice certain foods cause stomach pain.  Your home care treatment is not helping your pain.  You need stronger pain medicine.  You want your IUD removed.  You feel faint or lightheaded.  You develop nausea and vomiting.  You develop a rash.  You are having side effects or an allergy to your medicine. SEEK IMMEDIATE MEDICAL CARE IF:   Your pain does not go away or gets worse.  You have a fever.  Your pain is felt only in portions of the abdomen. The right side could possibly be appendicitis. The left lower portion of the abdomen could be colitis or diverticulitis.  You are passing blood in your stools (bright red or black tarry stools, with or without vomiting).  You have blood in your urine.  You develop chills, with or without a fever.  You pass out. MAKE SURE YOU:   Understand these instructions.  Will watch your condition.  Will get help right away if you are not doing well or get worse. Document Released: 08/14/2007 Document Revised: 03/03/2014 Document Reviewed: 09/03/2009 [facility] Patient Information 2015 [place], [place]. This information is not intended to replace advice given to you by your health care provider. Make sure you discuss any questions you have with your health care provider.  Fatty Liver Fatty liver is the accumulation of fat in liver cells. It is also called hepatosteatosis or steatohepatitis. It is normal for your liver to contain some fat. If fat is more than 5 to 10% of your liver's weight, you have fatty liver.  There are often no symptoms (problems) for years while damage is still occurring. People often learn about their fatty liver when they have medical tests for other reasons. Fat can damage your liver for years or even decades without causing problems. When it becomes severe, it can cause fatigue, weight loss, weakness, and confusion. This makes you more likely to develop more serious  liver problems. The liver is the largest organ in the body. It does a lot of work and often gives no warning signs when it is sick until late in a disease. The liver has many important jobs including:  Breaking down foods.  Storing vitamins, iron, and other minerals.  Making proteins.  Making bile for food digestion.  Breaking down many products including medications, alcohol and some poisons. CAUSES  There are a number of different conditions, medications, and poisons that can cause a fatty liver. Eating too many calories causes fat to build up in the liver. Not processing and breaking fats down normally may also cause this. Certain conditions, such as obesity, diabetes, and high triglycerides also cause this. Most fatty liver patients tend to be middle-aged and over weight.  Some causes of fatty liver are:  Alcohol over consumption.  Malnutrition.  Steroid use.  Valproic acid toxicity.  Obesity.  Cushing's syndrome.  Poisons.  Tetracycline in high dosages.  Pregnancy.  Diabetes.  Hyperlipidemia.  Rapid weight loss. Some people develop fatty liver even having none of these conditions. SYMPTOMS  Fatty liver most often causes no problems. This is called asymptomatic.  It can be diagnosed with blood tests and also by a liver biopsy.  It is one of the most common causes of minor elevations of  liver enzymes on routine blood tests.  Specialized Imaging of the liver using ultrasound, CT (computed tomography) scan, or MRI (magnetic resonance imaging) can suggest a fatty liver but a biopsy is needed to confirm it.  A biopsy involves taking a small sample of liver tissue. This is done by using a needle. It is then looked at under a microscope by a specialist. TREATMENT  It is important to treat the cause. Simple fatty liver without a medical reason may not need treatment.  Weight loss, fat restriction, and exercise in overweight patients produces inconsistent results but  is worth trying.  Fatty liver due to alcohol toxicity may not improve even with stopping drinking.  Good control of diabetes may reduce fatty liver.  Lower your triglycerides through diet, medication or both.  Eat a balanced, healthy diet.  Increase your physical activity.  Get regular checkups from a liver specialist.  There are no medical or surgical treatments for a fatty liver or NASH, but improving your diet and increasing your exercise may help prevent or reverse some of the damage. PROGNOSIS  Fatty liver may cause no damage or it can lead to an inflammation of the liver. This is, called steatohepatitis. When it is linked to alcohol abuse, it is called alcoholic steatohepatitis. It often is not linked to alcohol. It is then called nonalcoholic steatohepatitis, or NASH. Over time the liver may become scarred and hardened. This condition is called cirrhosis. Cirrhosis is serious and may lead to liver failure or cancer. NASH is one of the leading causes of cirrhosis. About 10-20% of Americans have fatty liver and a smaller 2-5% has NASH. Document Released: 12/02/2005 Document Revised: 01/09/2012 Document Reviewed: 02/26/2014 [facility] Patient Information 2015 [place], [place]. This information is not intended to replace advice given to you by your health care provider. Make sure you discuss any questions you have with your health care provider.

## 2015-02-19 ENCOUNTER — Ambulatory Visit (INDEPENDENT_AMBULATORY_CARE_PROVIDER_SITE_OTHER): Payer: 59 | Admitting: Family Medicine

## 2015-02-19 ENCOUNTER — Encounter: Payer: Self-pay | Admitting: Family Medicine

## 2015-02-19 VITALS — BP 138/80 | HR 68 | Temp 98.3°F | Ht 67.0 in | Wt 147.0 lb

## 2015-02-19 DIAGNOSIS — E119 Type 2 diabetes mellitus without complications: Secondary | ICD-10-CM | POA: Diagnosis not present

## 2015-02-19 DIAGNOSIS — M2550 Pain in unspecified joint: Secondary | ICD-10-CM

## 2015-02-19 LAB — POCT GLYCOSYLATED HEMOGLOBIN (HGB A1C): Hemoglobin A1C: 6.9

## 2015-02-19 MED ORDER — TROLAMINE SALICYLATE 10 % EX CREA: 1.0000 "application " | TOPICAL_CREAM | CUTANEOUS | Status: DC | PRN

## 2015-02-19 MED ORDER — ACETAMINOPHEN 500 MG PO TABS: 500.0000 mg | ORAL_TABLET | Freq: Four times a day (QID) | ORAL | Status: DC | PRN

## 2015-02-19 MED ORDER — MELOXICAM 15 MG PO TABS: 15.0000 mg | ORAL_TABLET | Freq: Every day | ORAL | Status: DC

## 2015-02-19 NOTE — Assessment & Plan Note (Signed)
-   A1C 6.9 today, down from 7.4 in 08/2014 - Up to date on ophthalmology visit (12/2014) - Continue current therapy of Metformin 1000mg  BID - Counseled on importance of exercise, encouraged current diet of fruits and vegetables and water - Follow up in 19months

## 2015-02-19 NOTE — Progress Notes (Addendum)
Subjective:     Patient ID: [name], female   DOB: 05/15/1966, 49 y.o.   MRN: [id number]  HPI [name] is a 49yo female presenting today for diabetes follow up. - States blood sugars are usually in the 100s. Occasional 200s but uncommon. - Denies any episodes of hypoglycemia - Takes Metformin 1000mg  BID - Seen by ophthalmologist in February 2016 - Eats fruits, vegetables. Drinks water - Does not exercise - Complains of bilateral knee pain and left shoulder, elbow, wrist pain x2 weeks. Notes it is a chronic problem that comes and goes. Has not taken any medication for this. - Ibuprofen makes her feel like her heart is racing - Scheduled to follow up with Gastroenterology for RUQ pain, diagnosed with fatty liver   Review of Systems  Musculoskeletal: Positive for myalgias and arthralgias.       Objective:   Physical Exam  Constitutional: She appears well-developed and well-nourished. No distress.  Cardiovascular: Normal rate and regular rhythm.  Exam reveals no gallop and no friction rub.   No murmur heard. Pulmonary/Chest: Effort normal. No respiratory distress. She has no wheezes. She has no rales.  Abdominal: Soft. She exhibits no distension.  Tenderness over RUQ  Musculoskeletal: She exhibits no edema.  Nodule noted in right forearm, attached to muscle. Crepitus noted in knees bilaterally, L>R  Skin: Skin is warm. No rash noted.  Diabetic Foot Exam: normal sensation, pulses palpable, no ulcers noted     Assessment:     Please refer to Problem List for Assessment.     Plan:     Please refer to Problem List for Plan.

## 2015-02-19 NOTE — Patient Instructions (Addendum)
Your A1C (which tells [place] what your blood sugar has been over the last 3 months) is better today. It is 6.9, down from 7.4 last time! We will continue Metformin 1000mg  twice a day. Keep up the good work and continue to work on [employer] and exercise! I will also give you a cream for you to use on your knees and arm. I have refilled your Mobic to help with the arthritis.  Please follow up in 3 months to recheck your blood sugar or sooner if needed.  Fatty Liver Fatty liver is the accumulation of fat in liver cells. It is also called hepatosteatosis or steatohepatitis. It is normal for your liver to contain some fat. If fat is more than 5 to 10% of your liver's weight, you have fatty liver.  There are often no symptoms (problems) for years while damage is still occurring. People often learn about their fatty liver when they have medical tests for other reasons. Fat can damage your liver for years or even decades without causing problems. When it becomes severe, it can cause fatigue, weight loss, weakness, and confusion. This makes you more likely to develop more serious liver problems. The liver is the largest organ in the body. It does a lot of work and often gives no warning signs when it is sick until late in a disease. The liver has many important jobs including:  Breaking down foods.  Storing vitamins, iron, and other minerals.  Making proteins.  Making bile for food digestion.  Breaking down many products including medications, alcohol and some poisons. CAUSES  There are a number of different conditions, medications, and poisons that can cause a fatty liver. Eating too many calories causes fat to build up in the liver. Not processing and breaking fats down normally may also cause this. Certain conditions, such as obesity, diabetes, and high triglycerides also cause this. Most fatty liver patients tend to be middle-aged and over weight.  Some causes of fatty liver are:  Alcohol over  consumption.  Malnutrition.  Steroid use.  Valproic acid toxicity.  Obesity.  Cushing's syndrome.  Poisons.  Tetracycline in high dosages.  Pregnancy.  Diabetes.  Hyperlipidemia.  Rapid weight loss. Some people develop fatty liver even having none of these conditions. SYMPTOMS  Fatty liver most often causes no problems. This is called asymptomatic.  It can be diagnosed with blood tests and also by a liver biopsy.  It is one of the most common causes of minor elevations of liver enzymes on routine blood tests.  Specialized Imaging of the liver using ultrasound, CT (computed tomography) scan, or MRI (magnetic resonance imaging) can suggest a fatty liver but a biopsy is needed to confirm it.  A biopsy involves taking a small sample of liver tissue. This is done by using a needle. It is then looked at under a microscope by a specialist. TREATMENT  It is important to treat the cause. Simple fatty liver without a medical reason may not need treatment.  Weight loss, fat restriction, and exercise in overweight patients produces inconsistent results but is worth trying.  Fatty liver due to alcohol toxicity may not improve even with stopping drinking.  Good control of diabetes may reduce fatty liver.  Lower your triglycerides through diet, medication or both.  Eat a balanced, healthy diet.  Increase your physical activity.  Get regular checkups from a liver specialist.  There are no medical or surgical treatments for a fatty liver or NASH, but improving your diet  and increasing your exercise may help prevent or reverse some of the damage. PROGNOSIS  Fatty liver may cause no damage or it can lead to an inflammation of the liver. This is, called steatohepatitis. When it is linked to alcohol abuse, it is called alcoholic steatohepatitis. It often is not linked to alcohol. It is then called nonalcoholic steatohepatitis, or NASH. Over time the liver may become scarred and  hardened. This condition is called cirrhosis. Cirrhosis is serious and may lead to liver failure or cancer. NASH is one of the leading causes of cirrhosis. About 10-20% of Americans have fatty liver and a smaller 2-5% has NASH. Document Released: 12/02/2005 Document Revised: 01/09/2012 Document Reviewed: 02/26/2014 [facility] Patient Information 2015 [place], [place]. This information is not intended to replace advice given to you by your health care provider. Make sure you discuss any questions you have with your health care provider.

## 2015-02-19 NOTE — Assessment & Plan Note (Addendum)
-  Acute exacerbation of chronic arthritis - Aspercreme - Tylenol $RemoveB'500mg'[id number]$  q6hr PRN. #30. Liver enzymes normal in March.  - Mobic refilled - Encouraged exercise to help with bilateral knee pain - Previous workup with ESR 40, CRP <0.5, ANA negative, RF <10, CK total 89, CK MB 1.1

## 2015-02-24 ENCOUNTER — Ambulatory Visit (INDEPENDENT_AMBULATORY_CARE_PROVIDER_SITE_OTHER): Payer: 59 | Admitting: Internal Medicine

## 2015-02-24 ENCOUNTER — Encounter: Payer: Self-pay | Admitting: Internal Medicine

## 2015-02-24 VITALS — BP 144/74 | HR 92 | Ht 63.0 in | Wt 146.0 lb

## 2015-02-24 DIAGNOSIS — R109 Unspecified abdominal pain: Secondary | ICD-10-CM

## 2015-02-24 DIAGNOSIS — Z1211 Encounter for screening for malignant neoplasm of colon: Secondary | ICD-10-CM

## 2015-02-24 DIAGNOSIS — K420 Umbilical hernia with obstruction, without gangrene: Secondary | ICD-10-CM | POA: Diagnosis not present

## 2015-02-24 DIAGNOSIS — K219 Gastro-esophageal reflux disease without esophagitis: Secondary | ICD-10-CM | POA: Diagnosis not present

## 2015-02-24 DIAGNOSIS — R932 Abnormal findings on diagnostic imaging of liver and biliary tract: Secondary | ICD-10-CM

## 2015-02-24 DIAGNOSIS — K760 Fatty (change of) liver, not elsewhere classified: Secondary | ICD-10-CM

## 2015-02-24 NOTE — Progress Notes (Signed)
HISTORY OF PRESENT ILLNESS:  [name] is a 49 y.o. female , from the [place], who is referred by Dr. Kathrynn Humble the emergency room for evaluation of abdominal pain and abnormal CT. She is accompanied by her husband who helps with the interpretation, though this patient does speak some [place]. Patient's history is that of chronic intermittent vague right-sided abdominal discomfort which is undergone multiple previous evaluations with multiple imaging studies. No cause found. She is status post cholecystectomy, appendectomy, and cesarean section 2. She went to the emergency room 01/06/2015 for right lower quadrant discomfort. Data from that encounter reviewed personally. CT scan of the abdomen and pelvis with contrast revealed diffuse fatty liver and a fat containing umbilical hernia. Laboratories were entirely normal except for mild elevation of glucose. Hemoglobin A1c was 7.4 in November. Her recent liver tests were normal. Patient states that her abdominal discomfort is better. She describes intermittent aching which can be exacerbated by position but not meals. Her bowel habits are described as regular without blood. She does have occasional indigestion or heartburn for which she takes Tums with prompt relief. No dysphagia. Her weight has been stable. No family history of gastrointestinal malignancy. She's been diabetic for 2 years. She does have a history of multiple somatic complaints.  REVIEW OF SYSTEMS:  All non-GI ROS negative except for allergies  Past Medical History  Diagnosis Date  . Somatic complaints, multiple   . Depressed   . Abnormal abdominal CT scan 01/20/2005    and pelvic CT, normal   . Abdominal ultrasound, abnormal     normal  . History of pelvic ultrasound 12/06    normal   . History of barium enema     normal  . Diabetes mellitus without complication   . Fatty liver     Past Surgical History  Procedure Laterality Date  . Appendectomy    . Caesarean section      x2  . Cholecystectomy  2009    Social History [name]  reports that she has never smoked. She has never used smokeless tobacco. She reports that she does not drink alcohol or use illicit drugs.  family history includes Diabetes in her mother; Hypertension in her mother; Throat cancer in her father.  Allergies  Allergen Reactions  . Eggs Or Egg-Derived Products     Not allergic but get abd discomfort   . Soy Allergy     Not allergic but get abd discomfort       PHYSICAL EXAMINATION: Vital signs: BP 144/74 mmHg  Pulse 92  Ht 5\' 3"  (1.6 m)  Wt 146 lb (66.225 kg)  BMI 25.87 kg/m2  LMP 01/23/2015 General: Well-developed, well-nourished, no acute distress HEENT: Sclerae are anicteric, conjunctiva pink. Oral mucosa intact Lungs: Clear Heart: Regular Abdomen: soft, nontender, nondistended, no obvious ascites, no peritoneal signs, normal bowel sounds. No organomegaly. Previous surgical incisions well-healed. Fat-containing periumbilical hernia palpated without tenderness Extremities: No edema Psychiatric: Anxious but alert and oriented x3. Cooperative   ASSESSMENT:  #1. Chronic right-sided discomfort likely due to adhesions from previous surgery. No organic process identified. No worrisome features #2. Abnormal liver imaging suggesting fatty liver. May be secondary to known diabetes. Normal liver tests with no evidence for hepatic impairment. No further workup indicated #3. History of multiple somatic complaints. Anxious today. Reassurance provided #4. Asymptomatic periumbilical hernia #5. Colon cancer screening. Average risk #6. Mild GERD without alarm features managed with antiacids   PLAN:  #1. Reassurance regarding chronic pain and  CT scan abnormality of liver. Multiple questions asked and multiple answers provided #2. Reflux precautions #3. Antacids as needed #4. Screening colonoscopy at age 19. Advised #5. Return to the care of your primary provider.  A  copy has been sent to Dr.Nanavati and Dr. Gerlean Ren

## 2015-02-24 NOTE — Patient Instructions (Signed)
Please follow up with Dr. Perry as needed 

## 2015-03-18 ENCOUNTER — Ambulatory Visit: Payer: 59 | Admitting: Family Medicine

## 2015-03-23 ENCOUNTER — Emergency Department (INDEPENDENT_AMBULATORY_CARE_PROVIDER_SITE_OTHER)
Admission: EM | Admit: 2015-03-23 | Discharge: 2015-03-23 | Disposition: A | Discharge status: Home / Self Care | Payer: 59 | Source: Home / Self Care | Attending: Family Medicine | Admitting: Family Medicine

## 2015-03-23 ENCOUNTER — Encounter (HOSPITAL_COMMUNITY): Payer: Self-pay | Admitting: Emergency Medicine

## 2015-03-23 DIAGNOSIS — M7918 Myalgia, other site: Secondary | ICD-10-CM

## 2015-03-23 DIAGNOSIS — M791 Myalgia: Secondary | ICD-10-CM

## 2015-03-23 MED ORDER — METHOCARBAMOL 500 MG PO TABS: 500.0000 mg | ORAL_TABLET | Freq: Four times a day (QID) | ORAL | Status: DC | PRN

## 2015-03-23 MED ORDER — DICLOFENAC SODIUM 75 MG PO TBEC: 75.0000 mg | DELAYED_RELEASE_TABLET | Freq: Two times a day (BID) | ORAL | Status: DC

## 2015-03-23 NOTE — Discharge Instructions (Signed)
Your pain is likely what is called musculoskeletal. You have some spasms of your neck and trapezius muscles. He may also have some mild arthritis of the neck and shoulder. Please try the Voltaren twice a day for pain and inflammation. Please use the Robaxin as a muscle relaxer. This may make you a little sleepy. Please perform range of motion exercises twice daily and apply heat to your neck and shoulder area. Please follow up with your primary care physician next week.

## 2015-03-23 NOTE — ED Notes (Signed)
Pt has been suffering from left neck pain for about a month.  She has trouble sleeping on her left side and has LROM in her neck.

## 2015-03-23 NOTE — ED Provider Notes (Signed)
CSN: [id number]     Arrival date & time 03/23/15  1607 History   First MD Initiated Contact with Patient 03/23/15 1048     Chief Complaint  Patient presents with  . Neck Pain    left   (Consider location/radiation/quality/duration/timing/severity/associated sxs/prior Treatment) HPI   4 weeks ago patient developed left neck pain. Saw her PCP and was given some form of a topical cream without benefit. Patient describes the pain as sharp. Involving region from the neck to the left shoulder upper back and neck. Comes in goes. Sharp. Worse with certain movements. Worse when waking up after laying in a certain position. Denies headache, LOC, fever, nausea, vomiting, chest pain, shortness of breath, palpitations. Pain is getting worse. Denies trauma or any physical injury to the area.  Past Medical History  Diagnosis Date  . Somatic complaints, multiple   . Depressed   . Abnormal abdominal CT scan 01/20/2005    and pelvic CT, normal   . Abdominal ultrasound, abnormal     normal  . History of pelvic ultrasound 12/06    normal   . History of barium enema     normal  . Diabetes mellitus without complication   . Fatty liver    Past Surgical History  Procedure Laterality Date  . Appendectomy    . Caesarean section      x2  . Cholecystectomy  2009   Family History  Problem Relation Age of Onset  . Diabetes Mother   . Hypertension Mother   . Throat cancer Father     non-smoker   History  Substance Use Topics  . Smoking status: Never Smoker   . Smokeless tobacco: Never Used  . Alcohol Use: No   OB History    Gravida Para Term Preterm AB TAB SAB Ectopic Multiple Living   4 3 3  1   1  3      Review of Systems Per HPI with all other pertinent systems negative.   Allergies  Eggs or egg-derived products and Soy allergy  Home Medications   Prior to Admission medications   Medication Sig Start Date End Date Taking? Authorizing Provider  metFORMIN (GLUCOPHAGE) 500 MG tablet  Take 2 tablets (1,000 mg total) by mouth 2 (two) times daily with a meal. 09/16/14  Yes Aquilla Hacker, MD  diclofenac (VOLTAREN) 75 MG EC tablet Take 1 tablet (75 mg total) by mouth 2 (two) times daily. 03/23/15   Waldemar Dickens, MD  methocarbamol (ROBAXIN) 500 MG tablet Take 1-2 tablets (500-1,000 mg total) by mouth every 6 (six) hours as needed for muscle spasms. 03/23/15   Waldemar Dickens, MD   BP 153/73 mmHg  Pulse 83  Temp(Src) 98.4 F (36.9 C) (Oral)  Resp 16  SpO2 99%  LMP 03/23/2015 Physical Exam Physical Exam  Constitutional: oriented to person, place, and time. appears well-developed and well-nourished. No distress.  HENT:  Head: Normocephalic and atraumatic.  Eyes: EOMI. PERRL.  Neck: Normal range of motion.  Cardiovascular: RRR, no m/r/g, 2+ distal pulses,  Pulmonary/Chest: Effort normal and breath sounds normal. No respiratory distress.  Abdominal: Soft. Bowel sounds are normal. NonTTP, no distension.  Musculoskeletal: Left trapezius muscle somewhat stiff in mildly tender to palpation. Neck with full range of motion. Left shoulder full range of motion.  Neurological: alert and oriented to person, place, and time.  Skin: Skin is warm. No rash noted. non diaphoretic.  Psychiatric: normal mood and affect. behavior is normal. Judgment and thought  content normal.   ED Course  Procedures (including critical care time) Labs Review Labs Reviewed - No data to display  Imaging Review No results found.   MDM   1. Musculoskeletal pain    Pain is likely secondary to trapezius muscle spasm and irritation. No evidence of radicular type pain. Start Robaxin and Voltaren. Follow up with PCP next week as previously designated for further workup if needed.    Waldemar Dickens, MD 03/23/15 [phone]

## 2015-03-31 ENCOUNTER — Ambulatory Visit: Payer: 59 | Admitting: Family Medicine

## 2015-04-03 ENCOUNTER — Encounter: Payer: Self-pay | Admitting: Family Medicine

## 2015-04-03 ENCOUNTER — Ambulatory Visit (INDEPENDENT_AMBULATORY_CARE_PROVIDER_SITE_OTHER): Payer: 59 | Admitting: Family Medicine

## 2015-04-03 VITALS — BP 152/75 | HR 76 | Temp 98.2°F | Ht 63.0 in | Wt 147.8 lb

## 2015-04-03 DIAGNOSIS — M25562 Pain in left knee: Secondary | ICD-10-CM

## 2015-04-03 DIAGNOSIS — S161XXD Strain of muscle, fascia and tendon at neck level, subsequent encounter: Secondary | ICD-10-CM

## 2015-04-03 DIAGNOSIS — M25561 Pain in right knee: Secondary | ICD-10-CM | POA: Diagnosis not present

## 2015-04-03 DIAGNOSIS — M2550 Pain in unspecified joint: Secondary | ICD-10-CM | POA: Diagnosis not present

## 2015-04-03 DIAGNOSIS — M542 Cervicalgia: Secondary | ICD-10-CM

## 2015-04-03 MED ORDER — CYCLOBENZAPRINE HCL 10 MG PO TABS: 10.0000 mg | ORAL_TABLET | Freq: Three times a day (TID) | ORAL | Status: DC | PRN

## 2015-04-03 NOTE — Patient Instructions (Addendum)
Thank you so much for coming to visit me today!  I have sent in a prescription for Flexeril, which is a stronger muscle relaxer. Please stop taking your Robaxin. I suspect the pain in your neck is due to your trapezius being strained. I would also recommend buying Salonpas Patches from your drug store and applying the patches along the area of pain every 8 hours.   I suspect your knee pain is due to arthritis. All of the lab work to explain your pain has been negative. I have ordered xrays for both knees...[facility] may go to [facility] to have these Xrays done.   Follow up in 1-2 weeks.  Thanks again! Dr. Gerlean Ren

## 2015-04-04 NOTE — Progress Notes (Signed)
Subjective:     Patient ID: [name], female   DOB: Sep 04, 1966, 49 y.o.   MRN: [id number]  HPI Mrs. Feld is a 49yo female presenting today for follow up of neck pain and bilateral knee pain. - Chronic complaint that comes and goes - Notes no improvement in neck pain - Chronic in bilateral knees and in left neck and shoulder. Pain in neck limiting ROM. - Mild relief with Mobic. No relief with Aspercreme. No relief with Robaxin or Voltaren. - No history of knee xray - States religious holiday next week allowing food only twice a day, so if medication prescribed will need to be BID dosing. - Chart review shows several visits for knee pain:  05/05/14: Complained of bilateral knee, elbow, hand, and ankle pain >10years. Lab workup done at this time. TSH 3.325. CBC . ESR elevated at 40. CRP <0.5. ANA negative. RF <10. CK normal. PTH 77.3  05/29/14: Trial Ibuprofen and Cymbalta  02/19/15: Suspected to be secondary to arthritis. Tylenol prescribed, Mobic refilled, Aspercreme encouraged  03/23/15: Trip to Urgent Care. 4week history of left neck pain. Aspercreme without relief. Pain located on left upper shoulder and neck. Robaxin and Voltaren prescribed.  Review of Systems  Respiratory: Negative for shortness of breath.   Musculoskeletal: Positive for myalgias, arthralgias and neck pain.      Objective:   Physical Exam  Constitutional: She appears well-developed and well-nourished. No distress.  HENT:  Head: Normocephalic and atraumatic.  Mouth/Throat: No oropharyngeal exudate.  Neck:  Decreased ROM secondary to pain. Tenderness and tightness along left trapezius, at attachment along occiput and left shoulder. Negative Spurlings.   Cardiovascular: Normal rate and regular rhythm.  Exam reveals no gallop and no friction rub.   No murmur heard. Pulmonary/Chest: Effort normal. No respiratory distress. She has no wheezes. She has no rales.  Abdominal: Soft. She exhibits no distension.  There is no tenderness.  Musculoskeletal:  Bilateral crepitus noted in knees       Assessment:     Please refer to Problem List for Assessment.    Plan:     Please refer to Problem List for Plan.

## 2015-04-07 DIAGNOSIS — S161XXA Strain of muscle, fascia and tendon at neck level, initial encounter: Secondary | ICD-10-CM

## 2015-04-07 HISTORY — DX: Strain of muscle, fascia and tendon at neck level, initial encounter: S16.1XXA

## 2015-04-07 NOTE — Assessment & Plan Note (Signed)
-   Noted along left trapezius - Flexeril given 15 days. Will take BID instead of TID due to religious holiday limiting oral intake to twice daily. Discontinue Robaxin. - Salonpas patches - Heat - Follow up in 2-3 weeks

## 2015-04-07 NOTE — Assessment & Plan Note (Signed)
-   Workup normal so far - Obtain xrays of knees bilaterally. No history of xrays being done - Consider injections if indicated. States she doesn't think she will agree to injections. - Continue Mobic

## 2015-04-27 ENCOUNTER — Ambulatory Visit (INDEPENDENT_AMBULATORY_CARE_PROVIDER_SITE_OTHER): Payer: 59 | Admitting: Family Medicine

## 2015-04-27 ENCOUNTER — Ambulatory Visit (HOSPITAL_COMMUNITY)
Admission: RE | Admit: 2015-04-27 | Discharge: 2015-04-27 | Disposition: A | Discharge status: Home / Self Care | Payer: 59 | Source: Ambulatory Visit | Attending: Family Medicine | Admitting: Family Medicine

## 2015-04-27 ENCOUNTER — Encounter: Payer: Self-pay | Admitting: Family Medicine

## 2015-04-27 ENCOUNTER — Other Ambulatory Visit: Payer: Self-pay | Admitting: Family Medicine

## 2015-04-27 ENCOUNTER — Ambulatory Visit (HOSPITAL_COMMUNITY): Payer: 59

## 2015-04-27 VITALS — BP 138/87 | HR 77 | Temp 97.6°F | Ht 63.0 in | Wt 144.0 lb

## 2015-04-27 DIAGNOSIS — M25562 Pain in left knee: Secondary | ICD-10-CM | POA: Insufficient documentation

## 2015-04-27 DIAGNOSIS — S161XXD Strain of muscle, fascia and tendon at neck level, subsequent encounter: Secondary | ICD-10-CM

## 2015-04-27 DIAGNOSIS — M4802 Spinal stenosis, cervical region: Secondary | ICD-10-CM | POA: Diagnosis not present

## 2015-04-27 DIAGNOSIS — M542 Cervicalgia: Secondary | ICD-10-CM | POA: Diagnosis present

## 2015-04-27 DIAGNOSIS — M25561 Pain in right knee: Secondary | ICD-10-CM | POA: Insufficient documentation

## 2015-04-27 NOTE — Assessment & Plan Note (Signed)
-   Has tried Robaxin, Flexeril, NSAIDs, Salonpas patches, Aspercreme, and Voltaren without relief - Will refer to physical therapy - Cervical Xray to check bony structures

## 2015-04-27 NOTE — Progress Notes (Signed)
Subjective:     Patient ID: [name], female   DOB: November 02, 1965, 49 y.o.   MRN: [id number]  HPI Mrs. Gentzler is a 49yo female presenting for follow up of neck/shoulder pain and knee pain. - Neck pain worse than before - Approximately 8 weeks - Salonpas patches did not helped. Flexeril made really sleepy so she only took it once and then stopped - Not currently taking any medication for her pain - Recently seen on 04/03/15 and instructed to follow up. Prescription for Flexeril given and Robaxin discontinued. Also recommended Salonpas patches and heat. Continued Mobic. - For full chart review refer to office visit note from 04/03/15  Review of Systems  Musculoskeletal: Positive for neck pain and neck stiffness.       Objective:   Physical Exam  Constitutional: She appears well-developed and well-nourished. No distress.  Neck:  Tightness along left trapezius muscle with significant tenderness and pain. Limited ROM secondary to pain. Negative Spurlings.  Cardiovascular: Normal rate.  Exam reveals no gallop and no friction rub.   No murmur heard. Pulmonary/Chest: Effort normal. No respiratory distress. She has no wheezes. She has no rales.  Psychiatric: She has a normal mood and affect. Her behavior is normal.      Assessment:     Please refer to Problem List for Assessment.     Plan:     Please refer to Problem List for Plan.

## 2015-04-27 NOTE — Patient Instructions (Signed)
I'm so sorry you are still experiencing pain! I would like to get an xray of your neck so we can see how the bones in your neck look. You may have this done at the same time you are at [facility] getting your knee xrays. I am also making a referral to Physical Therapy, who should be able to provide additional resources to help relieve your neck pain! Please contact me after you start physical therapy to let me know how you are doing!  Dr. Gerlean Ren  Cervical Sprain A cervical sprain is an injury in the neck in which the strong, fibrous tissues (ligaments) that connect your neck bones stretch or tear. Cervical sprains can range from mild to severe. Severe cervical sprains can cause the neck vertebrae to be unstable. This can lead to damage of the spinal cord and can result in serious nervous system problems. The amount of time it takes for a cervical sprain to get better depends on the cause and extent of the injury. Most cervical sprains heal in 1 to 3 weeks. CAUSES  Severe cervical sprains may be caused by:   Contact sport injuries (such as from football, rugby, wrestling, hockey, auto racing, gymnastics, diving, martial arts, or boxing).   Motor vehicle collisions.   Whiplash injuries. This is an injury from a sudden forward and backward whipping movement of the head and neck.  Falls.  Mild cervical sprains may be caused by:   Being in an awkward position, such as while cradling a telephone between your ear and shoulder.   Sitting in a chair that does not offer proper support.   Working at a poorly Land[employer] station.   Looking up or down for long periods of time.  SYMPTOMS   Pain, soreness, stiffness, or a burning sensation in the front, back, or sides of the neck. This discomfort may develop immediately after the injury or slowly, 24 hours or more after the injury.   Pain or tenderness directly in the middle of the back of the neck.   Shoulder or upper back pain.    Limited ability to move the neck.   Headache.   Dizziness.   Weakness, numbness, or tingling in the hands or arms.   Muscle spasms.   Difficulty swallowing or chewing.   Tenderness and swelling of the neck.  DIAGNOSIS  Most of the time your health care provider can diagnose a cervical sprain by taking your history and doing a physical exam. Your health care provider will ask about previous neck injuries and any known neck problems, such as arthritis in the neck. X-rays may be taken to find out if there are any other problems, such as with the bones of the neck. Other tests, such as a CT scan or MRI, may also be needed.  TREATMENT  Treatment depends on the severity of the cervical sprain. Mild sprains can be treated with rest, keeping the neck in place (immobilization), and pain medicines. Severe cervical sprains are immediately immobilized. Further treatment is done to help with pain, muscle spasms, and other symptoms and may include:  Medicines, such as pain relievers, numbing medicines, or muscle relaxants.   Physical therapy. This may involve stretching exercises, strengthening exercises, and posture training. Exercises and improved posture can help stabilize the neck, strengthen muscles, and help stop symptoms from returning.  HOME CARE INSTRUCTIONS   Put ice on the injured area.   Put ice in a plastic bag.   Place a towel between your  skin and the bag.   Leave the ice on for 15-20 minutes, 3-4 times a day.   If your injury was severe, you may have been given a cervical collar to wear. A cervical collar is a two-piece collar designed to keep your neck from moving while it heals.  Do not remove the collar unless instructed by your health care provider.  If you have long hair, keep it outside of the collar.  Ask your health care provider before making any adjustments to your collar. Minor adjustments may be required over time to improve comfort and reduce  pressure on your chin or on the back of your head.  Ifyou are allowed to remove the collar for cleaning or bathing, follow your health care provider's instructions on how to do so safely.  Keep your collar clean by wiping it with mild soap and water and drying it completely. If the collar you have been given includes removable pads, remove them every 1-2 days and hand wash them with soap and water. Allow them to air dry. They should be completely dry before you wear them in the collar.  If you are allowed to remove the collar for cleaning and bathing, wash and dry the skin of your neck. Check your skin for irritation or sores. If you see any, tell your health care provider.  Do not drive while wearing the collar.   Only take over-the-counter or prescription medicines for pain, discomfort, or fever as directed by your health care provider.   Keep all follow-up appointments as directed by your health care provider.   Keep all physical therapy appointments as directed by your health care provider.   Make any needed adjustments to your workstation to promote good posture.   Avoid positions and activities that make your symptoms worse.   Warm up and stretch before being active to help prevent problems.  SEEK MEDICAL CARE IF:   Your pain is not controlled with medicine.   You are unable to decrease your pain medicine over time as planned.   Your activity level is not improving as expected.  SEEK IMMEDIATE MEDICAL CARE IF:   You develop any bleeding.  You develop stomach upset.  You have signs of an allergic reaction to your medicine.   Your symptoms get worse.   You develop new, unexplained symptoms.   You have numbness, tingling, weakness, or paralysis in any part of your body.  MAKE SURE YOU:   Understand these instructions.  Will watch your condition.  Will get help right away if you are not doing well or get worse. Document Released: 08/14/2007 Document  Revised: 10/22/2013 Document Reviewed: 04/24/2013 [facility] Patient Information 2015 [place], [place]. This information is not intended to replace advice given to you by your health care provider. Make sure you discuss any questions you have with your health care provider.

## 2015-06-11 ENCOUNTER — Ambulatory Visit (INDEPENDENT_AMBULATORY_CARE_PROVIDER_SITE_OTHER): Payer: 59 | Admitting: Family Medicine

## 2015-06-11 ENCOUNTER — Encounter: Payer: Self-pay | Admitting: Family Medicine

## 2015-06-11 VITALS — BP 130/82 | HR 75 | Temp 97.7°F | Ht 62.0 in | Wt 144.3 lb

## 2015-06-11 DIAGNOSIS — R110 Nausea: Secondary | ICD-10-CM

## 2015-06-11 DIAGNOSIS — R1013 Epigastric pain: Secondary | ICD-10-CM | POA: Insufficient documentation

## 2015-06-11 HISTORY — DX: Epigastric pain: R10.13

## 2015-06-11 LAB — COMPREHENSIVE METABOLIC PANEL
ALK PHOS: 68 U/L (ref 33–115)
ALT: 38 U/L — ABNORMAL HIGH (ref 6–29)
AST: 36 U/L — AB (ref 10–35)
Albumin: 4 g/dL (ref 3.6–5.1)
BILIRUBIN TOTAL: 0.5 mg/dL (ref 0.2–1.2)
BUN: 10 mg/dL (ref 7–25)
CHLORIDE: 101 mmol/L (ref 98–110)
CO2: 25 mmol/L (ref 20–31)
CREATININE: 0.7 mg/dL (ref 0.50–1.10)
Calcium: 9.4 mg/dL (ref 8.6–10.2)
GLUCOSE: 139 mg/dL — AB (ref 65–99)
POTASSIUM: 4 mmol/L (ref 3.5–5.3)
Sodium: 136 mmol/L (ref 135–146)
Total Protein: 7.6 g/dL (ref 6.1–8.1)

## 2015-06-11 LAB — CBC
HEMATOCRIT: 36.9 % (ref 36.0–46.0)
HEMOGLOBIN: 12.7 g/dL (ref 12.0–15.0)
MCH: 29.4 pg (ref 26.0–34.0)
MCHC: 34.4 g/dL (ref 30.0–36.0)
MCV: 85.4 fL (ref 78.0–100.0)
MPV: 9.8 fL (ref 8.6–12.4)
Platelets: 258 10*3/uL (ref 150–400)
RBC: 4.32 MIL/uL (ref 3.87–5.11)
RDW: 13.8 % (ref 11.5–15.5)
WBC: 6.5 10*3/uL (ref 4.0–10.5)

## 2015-06-11 LAB — LIPASE: Lipase: 41 U/L (ref 7–60)

## 2015-06-11 MED ORDER — ONDANSETRON 4 MG PO TBDP: 4.0000 mg | ORAL_TABLET | Freq: Three times a day (TID) | ORAL | Status: DC | PRN

## 2015-06-11 NOTE — Assessment & Plan Note (Signed)
No concern for surgical abdomen. Will check CBC, CMP, lipase. No pancreatitis RFs, may be atypical dyspepsia. Surgical absence of appendix and gall bladder obviates the need to r/o these etiologies. Depending on LFTs and h/o diffuse fatty liver on imaging we could evaluate for NAFLD. Defer further w/u to PCP.

## 2015-06-11 NOTE — Progress Notes (Signed)
Subjective: [name] is a 49 y.o. female presenting for stomach pain.  She reports 6 days of epigastric pain that radiates to the RUQ, described as severe, intermittent, worse after food, associated with non-bloody diarrhea for the past 3 days. Some nausea without vomiting. No dysphagia, weight loss, sick contacts. She has had a cholecystectomy and appendectomy in the past but the pain is different in character and severity from those episodes. She is able to take po.   - CT abd 01/06/2015: diffuse fatty liver - ROS: No fevers, dysuria, chest pain, dyspnea, diarrhea, constipation.   Objective: BP 130/82 mmHg  Pulse 75  Temp(Src) 97.7 F (36.5 C) (Oral)  Ht 5\' 2"  (1.575 m)  Wt 144 lb 4.8 oz (65.454 kg)  BMI 26.39 kg/m2  LMP 03/23/2015 Gen: Well-appearing 49 y.o. female in some pain. HEENT: Oropharynx clear, sclerae anicteric. CV: Regular rate, no murmur; radial, DP and PT pulses 2+ bilaterally; no LE edema, no JVD, cap refill < 2 sec. Pulm: Non-labored breathing ambient air; clear anteriorly, no wheezes or crackles GI: Normoactive BS; soft, epigastric and RUQ tenderness without rebound or guarding. Liver edge and spleen are not felt. No other areas of tenderness. Not distended. No suprapubic tenderness or CVA tenderness.  Skin: Not jaundiced, though incomplete exam due to pt's religious preference.   Assessment/Plan: [name] is a 49 y.o. female here for epigastric pain.  See problem list for plan.

## 2015-06-11 NOTE — Patient Instructions (Addendum)
Thank you for coming in today!  We are checking some labs today, and we'll call you if they are abnormal. If you do not hear from me by phone or letter in 2 weeks, please call us as we may have been unable to reach you.  - Blood tests  - I will send a medication called zofran to your pharmacy. You should take this 3 times per day to help with nausea.  - You can take tylenol as needed for the pain  Our clinic's number is [phone]. Feel free to call any time with questions or concerns. We will answer any questions after hours with our 24-hour emergency line at that number as well.   - Dr. Bonner Puna

## 2015-06-15 ENCOUNTER — Encounter: Payer: Self-pay | Admitting: Family Medicine

## 2015-08-04 ENCOUNTER — Ambulatory Visit: Payer: 59 | Admitting: Family Medicine

## 2015-08-11 ENCOUNTER — Ambulatory Visit (INDEPENDENT_AMBULATORY_CARE_PROVIDER_SITE_OTHER): Payer: 59 | Admitting: Family Medicine

## 2015-08-11 VITALS — BP 152/55 | HR 72 | Temp 98.2°F | Ht 62.0 in | Wt 147.0 lb

## 2015-08-11 DIAGNOSIS — E739 Lactose intolerance, unspecified: Secondary | ICD-10-CM

## 2015-08-11 DIAGNOSIS — E119 Type 2 diabetes mellitus without complications: Secondary | ICD-10-CM

## 2015-08-11 DIAGNOSIS — Z3200 Encounter for pregnancy test, result unknown: Secondary | ICD-10-CM | POA: Diagnosis not present

## 2015-08-11 LAB — POCT URINE PREGNANCY: PREG TEST UR: NEGATIVE

## 2015-08-11 LAB — POCT GLYCOSYLATED HEMOGLOBIN (HGB A1C): HEMOGLOBIN A1C: 7.3

## 2015-08-11 MED ORDER — LACTASE 3000 UNITS PO TABS: ORAL_TABLET | ORAL | Status: DC

## 2015-08-11 NOTE — Patient Instructions (Signed)
Thank you so much for coming to visit me today! I have sent in a prescription for Lactaid to help with your diarrhea. You may take 1-3 tablets with meals containing lactose (milk, cheese, icecream). Your A1C is 7.3, up from 6.9 at last check. Please continue to work on diet and exercise and we will recheck this in three months.   Thanks again! Dr. Gerlean Ren  Lactose Intolerance, Adult Lactose is the natural sugar found in milk and milk products, such as cheese and yogurt. Lactose is digested by lactase, an enzyme in your small intestine. Some people do not produce enough lactase to digest lactose. This is called lactose intolerance. Lactose intolerance is different from milk allergy, which is a more serious reaction to the protein in milk.  CAUSES Causes of lactose intolerance may include:   Normal aging. The ability to produce lactase may decline with age, causing lactose intolerance over time.  Being born without the ability to make lactase.   Digestive diseases such as gastroenteritis or inflammatory bowel disease.  Surgery or injuries to your small intestine.  Infection in your intestines.  Certain antibiotic medicines and cancer treatments. SIGNS AND SYMPTOMS  Lactose intolerance can cause uncomfortable symptoms. These are likely to occur within 30 minutes to 2 hours after eating or drinking foods containing lactose. Symptoms of lactose intolerance may include:  Nausea.  Diarrhea.  Abdominal cramps or pain.  Bloating.   Gas.  DIAGNOSIS  There are several tests your health care provider can do to diagnose lactose intolerance. These tests include a hydrogen breath test and stool acidity test.  TREATMENT  No treatment can improve your body's ability to produce lactase. However, your symptoms can be controlled by limiting or avoiding milk products and other sources of lactose and adjusting your diet. Lactose-free milk is often tolerated. Lactose digestion may also be improved  by adding lactase drops to regular milk or by taking lactase tablets when dairy products are consumed. Tolerance to lactose is individual. Some people may be able to eat or drink small amounts of products with lactose, while other may need to avoid lactose entirely. Talk to your health care provider about what is best for you.  HOME CARE INSTRUCTIONS  Limit or avoidfoods, beverages, and medicines containing lactose as directed by your health care provider.   Read food and medicine labels carefully to avoid products containing lactose, milk solids, casein, or whey.  If you eliminate dairy products, replace the protein, calcium, vitamin D, and other nutrients they contain through other foods. A registered dietitian or your health care provider can help you adjust your diet.  Choose a milk substitute that is fortified with calcium and vitamin D. Be aware that soy milk contains high quality protein, while milks made from nuts or grains contain very little protein.  Use lactase drops or tablets if directed by your health care provider. SEEK MEDICAL CARE IF: You have no relief from your symptoms after eliminating milk products and other sources of lactose.    This information is not intended to replace advice given to you by your health care provider. Make sure you discuss any questions you have with your health care provider.   Document Released: 10/17/2005 Document Revised: 11/07/2014 Document Reviewed: 01/17/2014 Elsevier Interactive Patient Education [employer].

## 2015-08-13 DIAGNOSIS — E739 Lactose intolerance, unspecified: Secondary | ICD-10-CM | POA: Insufficient documentation

## 2015-08-13 HISTORY — DX: Lactose intolerance, unspecified: E73.9

## 2015-08-13 NOTE — Assessment & Plan Note (Signed)
-   Reports diarrhea with consumption of milk, cheese, icecream - Recommend food diary detailing food consumed and diarrhea - Prescription for Lactaid given - Follow up if no improvement

## 2015-08-13 NOTE — Assessment & Plan Note (Signed)
-   A1C 7.3 today - Continue Metformin - Will not alter medications at this time. Presenting for diarrhea. Suspect Lactose Intolerance, however Metformin intolerance in differential. If no relief with Lactaid, consider discontinuing and transitioning to alternative medication - Continue to work on diet and exercise. - Follow up in three months

## 2015-08-13 NOTE — Progress Notes (Signed)
Subjective:     Patient ID: [name], female   DOB: 09-Mar-1966, 49 y.o.   MRN: [id number]  HPI [name] is a 49yo female presenting today for diarrhea. - Reports history of diarrhea x3 months - Mostly occurs after eating in the morning, rarely occurs at night - States mostly associated with milk, cheese, ice cream and other dairy products. Also notes some occurrence with spices. - Denies abdominal pain, nausea, vomiting, fever - Chart Review shows history of lactose intolerance in 2011, however did not follow up for this problem and does not recall this diagnosis.  - Last A1C 6.9 in 01/2015 - Reports average blood sugars 140s - Currently prescribed Metformin 1000mg  BID  Review of Systems Per HPI    Objective:   Physical Exam  Constitutional: She appears well-developed and well-nourished. No distress.  HENT:  Head: Normocephalic and atraumatic.  Cardiovascular: Normal rate and regular rhythm.  Exam reveals no gallop and no friction rub.   No murmur heard. Pulmonary/Chest: Effort normal. No respiratory distress. She has no wheezes. She has no rales.  Abdominal: Soft. Bowel sounds are normal. She exhibits no distension. There is no tenderness.  Musculoskeletal: She exhibits no edema.       Assessment and Plan:     Lactose intolerance - Reports diarrhea with consumption of milk, cheese, icecream - Recommend food diary detailing food consumed and diarrhea - Prescription for Lactaid given - Follow up if no improvement  Diabetes mellitus - A1C 7.3 today - Continue Metformin - Will not alter medications at this time. Presenting for diarrhea. Suspect Lactose Intolerance, however Metformin intolerance in differential. If no relief with Lactaid, consider discontinuing and transitioning to alternative medication - Continue to work on diet and exercise. - Follow up in three months

## 2015-08-21 ENCOUNTER — Other Ambulatory Visit: Payer: Self-pay | Admitting: Family Medicine

## 2015-08-21 DIAGNOSIS — E119 Type 2 diabetes mellitus without complications: Secondary | ICD-10-CM

## 2015-08-21 NOTE — Telephone Encounter (Signed)
Pt called and needs a refill on metFORMIN (GLUCOPHAGE) 500 MG tablet sent to [facility] on [address]. Sadie Reynolds, ASA

## 2015-08-24 MED ORDER — METFORMIN HCL 500 MG PO TABS: 1000.0000 mg | ORAL_TABLET | Freq: Two times a day (BID) | ORAL | Status: DC

## 2015-08-31 ENCOUNTER — Encounter: Payer: Self-pay | Admitting: Family Medicine

## 2015-08-31 ENCOUNTER — Ambulatory Visit (INDEPENDENT_AMBULATORY_CARE_PROVIDER_SITE_OTHER): Payer: 59 | Admitting: Family Medicine

## 2015-08-31 VITALS — BP 136/72 | HR 75 | Temp 98.3°F | Wt 144.9 lb

## 2015-08-31 DIAGNOSIS — R197 Diarrhea, unspecified: Secondary | ICD-10-CM

## 2015-08-31 NOTE — Progress Notes (Signed)
    Subjective: CC: diarrhea HPI: Patient is a 49 y.o. female presenting to clinic today for same day appt. Concerns today include:  1. Diarrhea Patient notes that she never took Lactaid because she has eliminated milk from her diet completed.  No cheese, yogurt.  She continues to have post prandial diarrhea.  She notes that she has struggled with diarrhea >2 months.  Sometimes as soon as 5 minutes after eating, sometimes about 1 hour after eating.  Denies melena, hematochezia, fatty stools, fevers, chills, nausea or vomiting.  Occasional cramp that is relieved with stooling.  No undercooked foods, no recent travel.  Gallbladder and appendix surgically removed per patient.  Social History Reviewed: non smoker. FamHx and MedHx updated.  Please see EMR.  ROS: Per HPI  Objective: Office vital signs reviewed. BP 136/72 mmHg  Pulse 75  Temp(Src) 98.3 F (36.8 C) (Oral)  Wt 144 lb 14.4 oz (65.726 kg)  SpO2 99%  LMP 08/30/2015 (Exact Date)  Physical Examination:  General: Awake, alert, well nourished, well appearing female, NAD HEENT: Normal, MMM Cardio: normal rate Pulm: normal WOB, no wheeze GI: soft, ND, mild generalized TTP, no rebound or guarding, + hyperactive BS x4, no masses Extremities: WWP, No edema, cyanosis or clubbing; +2 radial pulses bilaterally  Assessment/ Plan: 49 y.o. female with  1. Diarrhea, unspecified type.  I have a high suspicion for irritable bowel syndrome-D.  Has eliminated lactose from diet and does not seem to be helping.  Cannot r/o infectious source.  Though low suspicion because afebrile, no vomiting, no h/o eating undercooked foods and has been chronic in nature.  Will obtain stool evaluation.   - Risks and benefits of Imodium use discussed with patient.  Explained that if her diarrhea is infectious would be concerned about prolonging course.  Patient to make an informed decision. - GI pathogen panel by PCR, stool; Future - Ova and parasite  examination; Future - Fecal fat, qualitative; Future - Instructions for stool collection reviewed with patient - Will call patient with results - Follow up with Dr Gerlean Ren in 2 weeks or sooner if needed.   Janora Norlander, DO PGY-2, [place]

## 2015-08-31 NOTE — Patient Instructions (Signed)
I have placed an order for stool studies.  You can consider taking Immodium 1 tablet before meals.  However, please note that if you have an infection, this can make discomfort worse, as it will prolong your course.  Please follow up with Dr Gerlean Ren as needed for routine care.  Diet for Irritable Bowel Syndrome When you have irritable bowel syndrome (IBS), the foods you eat and your eating habits are very important. IBS may cause various symptoms, such as abdominal pain, constipation, or diarrhea. Choosing the right foods can help ease discomfort caused by these symptoms. Work with your health care provider and dietitian to find the best eating plan to help control your symptoms. WHAT GENERAL GUIDELINES DO I NEED TO FOLLOW?  Keep a food diary. This will help you identify foods that cause symptoms. Write down:  What you eat and when.  What symptoms you have.  When symptoms occur in relation to your meals.  Avoid foods that cause symptoms. Talk with your dietitian about other ways to get the same nutrients that are in these foods.  Eat more foods that contain fiber. Take a fiber supplement if directed by your dietitian.  Eat your meals slowly, in a relaxed setting.  Aim to eat 5-6 small meals per day. Do not skip meals.  Drink enough fluids to keep your urine clear or pale yellow.  Ask your health care provider if you should take an over-the-counter probiotic during flare-ups to help restore healthy gut bacteria.  If you have cramping or diarrhea, try making your meals low in fat and high in carbohydrates. Examples of carbohydrates are pasta, rice, whole grain breads and cereals, fruits, and vegetables.  If dairy products cause your symptoms to flare up, try eating less of them. You might be able to handle yogurt better than other dairy products because it contains bacteria that help with digestion. WHAT FOODS ARE NOT RECOMMENDED? The following are some foods and drinks that may worsen  your symptoms:  Fatty foods, such as [place] fries.  Milk products, such as cheese or ice cream.  Chocolate.  Alcohol.  Products with caffeine, such as coffee.  Carbonated drinks, such as soda. The items listed above may not be a complete list of foods and beverages to avoid. Contact your dietitian for more information. WHAT FOODS ARE GOOD SOURCES OF FIBER? Your health care provider or dietitian may recommend that you eat more foods that contain fiber. Fiber can help reduce constipation and other IBS symptoms. Add foods with fiber to your diet a little at a time so that your body can get used to them. Too much fiber at once might cause gas and swelling of your abdomen. The following are some foods that are good sources of fiber:  Apples.  Peaches.  Pears.  Berries.  Figs.  Broccoli (raw).  Cabbage.  Carrots.  Raw peas.  Kidney beans.  Lima beans.  Whole grain bread.  Whole grain cereal. FOR MORE INFORMATION  International Foundation for Functional Gastrointestinal Disorders: www.iffgd.[employer] of Diabetes and Digestive and Kidney Diseases: NetworkAffair.co.za.aspx   This information is not intended to replace advice given to you by your health care provider. Make sure you discuss any questions you have with your health care provider.   Document Released: 01/07/2004 Document Revised: 11/07/2014 Document Reviewed: 01/17/2014 Elsevier Interactive Patient Education [employer].

## 2015-09-09 ENCOUNTER — Telehealth: Payer: Self-pay | Admitting: Family Medicine

## 2015-09-09 DIAGNOSIS — E119 Type 2 diabetes mellitus without complications: Secondary | ICD-10-CM

## 2015-09-09 MED ORDER — METFORMIN HCL 500 MG PO TABS: 1000.0000 mg | ORAL_TABLET | Freq: Two times a day (BID) | ORAL | Status: DC

## 2015-09-09 NOTE — Telephone Encounter (Signed)
Done

## 2015-09-09 NOTE — Telephone Encounter (Signed)
Patient is needing a refill of metformin 1000 mg  called in to [facility] on [address].

## 2015-09-17 ENCOUNTER — Encounter: Payer: Self-pay | Admitting: Family Medicine

## 2015-09-17 ENCOUNTER — Ambulatory Visit (INDEPENDENT_AMBULATORY_CARE_PROVIDER_SITE_OTHER): Payer: 59 | Admitting: Family Medicine

## 2015-09-17 VITALS — BP 132/70 | HR 69 | Temp 98.2°F | Wt 143.0 lb

## 2015-09-17 DIAGNOSIS — L200 Besnier's prurigo: Secondary | ICD-10-CM

## 2015-09-17 DIAGNOSIS — E119 Type 2 diabetes mellitus without complications: Secondary | ICD-10-CM | POA: Diagnosis not present

## 2015-09-17 DIAGNOSIS — L239 Allergic contact dermatitis, unspecified cause: Secondary | ICD-10-CM

## 2015-09-17 HISTORY — DX: Allergic contact dermatitis, unspecified cause: L23.9

## 2015-09-17 MED ORDER — METFORMIN HCL 1000 MG PO TABS: 1000.0000 mg | ORAL_TABLET | Freq: Two times a day (BID) | ORAL | Status: DC

## 2015-09-17 MED ORDER — HYDROCORTISONE 2.5 % EX OINT: TOPICAL_OINTMENT | Freq: Two times a day (BID) | CUTANEOUS | Status: DC

## 2015-09-17 NOTE — Assessment & Plan Note (Signed)
49 year old female presents for evaluation of rash of the left underarm and bilateral lower extremity. Examination consistent with allergic dermatitis. -Attempt trial of hydrocortisone 2.5% to affected areas -Counseled patient on avoiding products containing fragrance as this may exacerbate symptoms

## 2015-09-17 NOTE — Patient Instructions (Signed)
It was nice to meet you today.  I think that you rash is due to an allergic reaction.  Please avoid deodorant to your arm pit for the next few days. Attempt [employer] brand deodorant. Avoid soaps/lotions/laundry detergent with fragrances.

## 2015-09-17 NOTE — Progress Notes (Addendum)
   Subjective:    Patient ID: [name], female    DOB: July 03, 1966, 49 y.o.   MRN: [id number]  HPI 49 year old female presents for evaluation of rash of left armpit and bilateral lower extremity.  Patient reports that she has had rash and itching of the left underarm for a number of months however this is worsened over the past 5 days, she does use deodorant daily however does not remember the name, she also reports 5 day history of rash and itching of bilateral lower extremity, reports no changes in perfumes, laundry detergent, or shampoos.   Review of Systems No fevers or chills    Objective:   Physical Exam Vitals: Reviewed Gen.: Pleasant female, no acute distress Skin: Maculopapular rash noted of bilateral lateral aspects of lower extremities as well as under the left armpit consistent with allergic dermatitis, no warmth or drainage consistent with infection       Assessment & Plan:  Allergic dermatitis 49 year old female presents for evaluation of rash of the left underarm and bilateral lower extremity. Examination consistent with allergic dermatitis. -Attempt trial of hydrocortisone 2.5% to affected areas -Counseled patient on avoiding products containing fragrance as this may exacerbate symptoms

## 2015-11-18 ENCOUNTER — Ambulatory Visit (INDEPENDENT_AMBULATORY_CARE_PROVIDER_SITE_OTHER): Payer: BLUE CROSS/BLUE SHIELD | Admitting: Obstetrics and Gynecology

## 2015-11-18 VITALS — BP 136/54 | HR 85 | Temp 98.6°F | Ht 62.0 in | Wt 146.2 lb

## 2015-11-18 DIAGNOSIS — E119 Type 2 diabetes mellitus without complications: Secondary | ICD-10-CM

## 2015-11-18 DIAGNOSIS — H8112 Benign paroxysmal vertigo, left ear: Secondary | ICD-10-CM | POA: Diagnosis not present

## 2015-11-18 LAB — POCT GLYCOSYLATED HEMOGLOBIN (HGB A1C): Hemoglobin A1C: 6.7

## 2015-11-18 MED ORDER — MECLIZINE HCL 25 MG PO TABS: 25.0000 mg | ORAL_TABLET | Freq: Three times a day (TID) | ORAL | Status: DC | PRN

## 2015-11-18 NOTE — Progress Notes (Signed)
   Subjective:   Patient ID: [name], female    DOB: 1966-02-17, 50 y.o.   MRN: [id number]  Patient presents for Same Day Appointment  Chief Complaint  Patient presents with  . Diabetes    recent high blood sugar reading/ shaking    HPI: #Diabetes:  Today took blood sugar this AM and it was 156 Due for A1c check Had some associated shaking Sugars usually range - High at home: 120s    Low at home: 110s Taking medications: metformin Diagnosed 3 years ago ROS: denies fever, chills, LOC, polyuria, polydipsia, numbness or tingling in extremities, chest pain  #Dizziness: Main concern seems to be that patient has been feeling dizzy and off balance. She related this to her elevated blood sugar this morning as possible cause.  Feeling dizzy for 3 days. Feels like room spins: yes Lightheadedness when stands: yes Medications tried: none  Symptoms Hearing Loss: some Ear Pain or fullness: no Nausea or vomiting: yes nasuea Vision difficulty or double vision: yes Falls: no Weakness: yes Head trauma: no Headache: yes, intermittent  ROS see HPI Smoking Status noted  Past medical history, surgical, family, and social history reviewed and updated in the EMR as appropriate.  Objective:  BP 136/54 mmHg  Pulse 85  Temp(Src) 98.6 F (37 C) (Oral)  Ht 5\' 2"  (1.575 m)  Wt 146 lb 3.2 oz (66.316 kg)  BMI 26.73 kg/m2  LMP 11/10/2015 (Exact Date) Vitals and nursing note reviewed  Physical Exam  Constitutional: She is well-developed, well-nourished, and in no distress.  HENT:  Head: Normocephalic and atraumatic.  Right Ear: External ear normal.  Left Ear: External ear normal.  Eyes: Conjunctivae and EOM are normal. Pupils are equal, round, and reactive to light.  Cardiovascular: Normal rate, regular rhythm and normal heart sounds.   Pulmonary/Chest: Effort normal and breath sounds normal.  Neurological: She is alert. She has normal strength. She displays no tremor. No sensory  deficit. Gait abnormal.  CNs grossly intact   A1c - 6.7  Assessment & Plan:  1. Type 2 diabetes mellitus without complication, without long-term current use of insulin ([place]) Patient appears to be well-controlled on her metformin dose. A1c today 6.7. Will follow-up with PCP for further management. Discussed signs and symtpoms of hypoglycemia and hyperglycemia.   2. Benign paroxysmal positional vertigo, left Majority of symptoms seem to be related to vertigo. She has leftward gait disturbance. Dix-hallpike positive on left. However could not fully appreciate nystagmus with squinted eyes but symptoms worse on left. Reviewed past medical history and noted chronic headaches. Meclizine given for symptom management. Handout on treatment with Epley maneuver also given. Return precautions given along with work note. May consider imaging if worsening or not better on return.     Orders Placed This Encounter  Procedures  . POCT A1C   Meds ordered this encounter  Medications  . meclizine (ANTIVERT) 25 MG tablet    Sig: Take 1 tablet (25 mg total) by mouth 3 (three) times daily as needed for dizziness.    Dispense:  30 tablet    Refill:  [place], DO 11/18/2015, 2:08 PM PGY-2, [place]

## 2015-11-18 NOTE — Patient Instructions (Signed)
Please try the below maneuvers and take medications to help with treatment.  Please return to clinic in about a week if symptoms not improved; return sooner if symptoms worsening    Benign Positional Vertigo Vertigo is the feeling that you or your surroundings are moving when they are not. Benign positional vertigo is the most common form of vertigo. The cause of this condition is not serious (is benign). This condition is triggered by certain movements and positions (is positional). This condition can be dangerous if it occurs while you are doing something that could endanger you or others, such as driving.  CAUSES In many cases, the cause of this condition is not known. It may be caused by a disturbance in an area of the inner ear that helps your brain to sense movement and balance. This disturbance can be caused by a viral infection (labyrinthitis), head injury, or repetitive motion. RISK FACTORS This condition is more likely to develop in: 1. Women. 2. People who are 38 years of age or older. SYMPTOMS Symptoms of this condition usually happen when you move your head or your eyes in different directions. Symptoms may start suddenly, and they usually last for less than a minute. Symptoms may include:  Loss of balance and falling.  Feeling like you are spinning or moving.  Feeling like your surroundings are spinning or moving.  Nausea and vomiting.  Blurred vision.  Dizziness.  Involuntary eye movement (nystagmus). Symptoms can be mild and cause only slight annoyance, or they can be severe and interfere with daily life. Episodes of benign positional vertigo may return (recur) over time, and they may be triggered by certain movements. Symptoms may improve over time. DIAGNOSIS This condition is usually diagnosed by medical history and a physical exam of the head, neck, and ears. You may be referred to a health care provider who specializes in ear, nose, and throat (ENT) problems  (otolaryngologist) or a provider who specializes in disorders of the nervous system (neurologist). You may have additional testing, including:  MRI.  A CT scan.  Eye movement tests. Your health care provider may ask you to change positions quickly while he or she watches you for symptoms of benign positional vertigo, such as nystagmus. Eye movement may be tested with an electronystagmogram (ENG), caloric stimulation, the Dix-Hallpike test, or the roll test.  An electroencephalogram (EEG). This records electrical activity in your brain.  Hearing tests. TREATMENT Usually, your health care provider will treat this by moving your head in specific positions to adjust your inner ear back to normal. Surgery may be needed in severe cases, but this is rare. In some cases, benign positional vertigo may resolve on its own in 2-4 weeks. HOME CARE INSTRUCTIONS Safety  Move slowly.Avoid sudden body or head movements.  Avoid driving.  Avoid operating heavy machinery.  Avoid doing any tasks that would be dangerous to you or others if a vertigo episode would occur.  If you have trouble walking or keeping your balance, try using a cane for stability. If you feel dizzy or unstable, sit down right away.  Return to your normal activities as told by your health care provider. Ask your health care provider what activities are safe for you. General Instructions  Take over-the-counter and prescription medicines only as told by your health care provider.  Avoid certain positions or movements as told by your health care provider.  Drink enough fluid to keep your urine clear or pale yellow.  Keep all follow-up visits as  told by your health care provider. This is important. SEEK MEDICAL CARE IF:  You have a fever.  Your condition gets worse or you develop new symptoms.  Your family or friends notice any behavioral changes.  Your nausea or vomiting gets worse.  You have numbness or a "pins and  needles" sensation. SEEK IMMEDIATE MEDICAL CARE IF:  You have difficulty speaking or moving.  You are always dizzy.  You faint.  You develop severe headaches.  You have weakness in your legs or arms.  You have changes in your hearing or vision.  You develop a stiff neck.  You develop sensitivity to light.   This information is not intended to replace advice given to you by your health care provider. Make sure you discuss any questions you have with your health care provider.   Document Released: 07/25/2006 Document Revised: 07/08/2015 Document Reviewed: 02/09/2015  Epley Maneuver Self-Care WHAT IS THE EPLEY MANEUVER? The Epley maneuver is an exercise you can do to relieve symptoms of benign paroxysmal positional vertigo (BPPV). This condition is often just referred to as vertigo. BPPV is caused by the movement of tiny crystals (canaliths) inside your inner ear. The accumulation and movement of canaliths in your inner ear causes a sudden spinning sensation (vertigo) when you move your head to certain positions. Vertigo usually lasts about 30 seconds. BPPV usually occurs in just one ear. If you get vertigo when you lie on your left side, you probably have BPPV in your left ear. Your health care provider can tell you which ear is involved.  BPPV may be caused by a head injury. Many people older than 50 get BPPV for unknown reasons. If you have been diagnosed with BPPV, your health care provider may teach you how to do this maneuver. BPPV is not life threatening (benign) and usually goes away in time.  WHEN SHOULD I PERFORM THE EPLEY MANEUVER? You can do this maneuver at home whenever you have symptoms of vertigo. You may do the Epley maneuver up to 3 times a day until your symptoms of vertigo go away. HOW SHOULD I DO THE EPLEY MANEUVER? 3. Sit on the edge of a bed or table with your back straight. Your legs should be extended or hanging over the edge of the bed or table.  4. Turn your  head halfway toward the affected ear.  5. Lie backward quickly with your head turned until you are lying flat on your back. You may want to position a pillow under your shoulders.  6. Hold this position for 30 seconds. You may experience an attack of vertigo. This is normal. Hold this position until the vertigo stops. 7. Then turn your head to the opposite direction until your unaffected ear is facing the floor.  8. Hold this position for 30 seconds. You may experience an attack of vertigo. This is normal. Hold this position until the vertigo stops. 9. Now turn your whole body to the same side as your head. Hold for another 30 seconds.  10. You can then sit back up. ARE THERE RISKS TO THIS MANEUVER? In some cases, you may have other symptoms (such as changes in your vision, weakness, or numbness). If you have these symptoms, stop doing the maneuver and call your health care provider. Even if doing these maneuvers relieves your vertigo, you may still have dizziness. Dizziness is the sensation of light-headedness but without the sensation of movement. Even though the Epley maneuver may relieve your vertigo, it is  possible that your symptoms will return within 5 years. WHAT SHOULD I DO AFTER THIS MANEUVER? After doing the Epley maneuver, you can return to your normal activities. Ask your doctor if there is anything you should do at home to prevent vertigo. This may include:  Sleeping with two or more pillows to keep your head elevated.  Not sleeping on the side of your affected ear.  Getting up slowly from bed.  Avoiding sudden movements during the day.  Avoiding extreme head movement, like looking up or bending over.  Wearing a cervical collar to prevent sudden head movements. WHAT SHOULD I DO IF MY SYMPTOMS GET WORSE? Call your health care provider if your vertigo gets worse. Call your provider right way if you have other symptoms, including:    Nausea.  Vomiting.  Headache.  Weakness.  Numbness.  Vision changes.   This information is not intended to replace advice given to you by your health care provider. Make sure you discuss any questions you have with your health care provider.

## 2015-11-22 ENCOUNTER — Encounter (HOSPITAL_COMMUNITY): Payer: Self-pay | Admitting: Emergency Medicine

## 2015-11-22 ENCOUNTER — Encounter: Payer: Self-pay | Admitting: Obstetrics and Gynecology

## 2015-11-22 ENCOUNTER — Emergency Department (HOSPITAL_COMMUNITY)
Admission: EM | Admit: 2015-11-22 | Discharge: 2015-11-22 | Disposition: A | Discharge status: Home / Self Care | Payer: BLUE CROSS/BLUE SHIELD | Attending: Emergency Medicine | Admitting: Emergency Medicine

## 2015-11-22 ENCOUNTER — Emergency Department (HOSPITAL_COMMUNITY): Payer: BLUE CROSS/BLUE SHIELD

## 2015-11-22 DIAGNOSIS — R51 Headache: Secondary | ICD-10-CM | POA: Diagnosis not present

## 2015-11-22 DIAGNOSIS — R42 Dizziness and giddiness: Secondary | ICD-10-CM | POA: Insufficient documentation

## 2015-11-22 DIAGNOSIS — Z7952 Long term (current) use of systemic steroids: Secondary | ICD-10-CM | POA: Insufficient documentation

## 2015-11-22 DIAGNOSIS — Z8719 Personal history of other diseases of the digestive system: Secondary | ICD-10-CM | POA: Insufficient documentation

## 2015-11-22 DIAGNOSIS — Z7984 Long term (current) use of oral hypoglycemic drugs: Secondary | ICD-10-CM | POA: Insufficient documentation

## 2015-11-22 DIAGNOSIS — R531 Weakness: Secondary | ICD-10-CM | POA: Diagnosis not present

## 2015-11-22 DIAGNOSIS — R110 Nausea: Secondary | ICD-10-CM | POA: Diagnosis not present

## 2015-11-22 DIAGNOSIS — H9190 Unspecified hearing loss, unspecified ear: Secondary | ICD-10-CM | POA: Insufficient documentation

## 2015-11-22 DIAGNOSIS — J029 Acute pharyngitis, unspecified: Secondary | ICD-10-CM | POA: Insufficient documentation

## 2015-11-22 DIAGNOSIS — Z8659 Personal history of other mental and behavioral disorders: Secondary | ICD-10-CM | POA: Diagnosis not present

## 2015-11-22 DIAGNOSIS — H539 Unspecified visual disturbance: Secondary | ICD-10-CM | POA: Insufficient documentation

## 2015-11-22 DIAGNOSIS — E119 Type 2 diabetes mellitus without complications: Secondary | ICD-10-CM | POA: Insufficient documentation

## 2015-11-22 DIAGNOSIS — R519 Headache, unspecified: Secondary | ICD-10-CM

## 2015-11-22 LAB — CBC WITH DIFFERENTIAL/PLATELET
BASOS PCT: 0 %
Basophils Absolute: 0 10*3/uL (ref 0.0–0.1)
Eosinophils Absolute: 0.3 10*3/uL (ref 0.0–0.7)
Eosinophils Relative: 4 %
HEMATOCRIT: 33.8 % — AB (ref 36.0–46.0)
HEMOGLOBIN: 11.2 g/dL — AB (ref 12.0–15.0)
LYMPHS ABS: 2.9 10*3/uL (ref 0.7–4.0)
LYMPHS PCT: 44 %
MCH: 28.9 pg (ref 26.0–34.0)
MCHC: 33.1 g/dL (ref 30.0–36.0)
MCV: 87.1 fL (ref 78.0–100.0)
MONO ABS: 0.5 10*3/uL (ref 0.1–1.0)
MONOS PCT: 8 %
NEUTROS ABS: 2.9 10*3/uL (ref 1.7–7.7)
Neutrophils Relative %: 44 %
Platelets: 253 10*3/uL (ref 150–400)
RBC: 3.88 MIL/uL (ref 3.87–5.11)
RDW: 13.4 % (ref 11.5–15.5)
WBC: 6.6 10*3/uL (ref 4.0–10.5)

## 2015-11-22 LAB — BASIC METABOLIC PANEL
Anion gap: 12 (ref 5–15)
BUN: 7 mg/dL (ref 6–20)
CALCIUM: 9.1 mg/dL (ref 8.9–10.3)
CHLORIDE: 104 mmol/L (ref 101–111)
CO2: 23 mmol/L (ref 22–32)
CREATININE: 0.65 mg/dL (ref 0.44–1.00)
GFR calc Af Amer: >60 mL/min (ref >60)
GFR calc non Af Amer: >60 mL/min (ref >60)
GLUCOSE: 113 mg/dL — AB (ref 65–99)
Potassium: 4 mmol/L (ref 3.5–5.1)
Sodium: 139 mmol/L (ref 135–145)

## 2015-11-22 LAB — RAPID STREP SCREEN (MED CTR MEBANE ONLY): Streptococcus, Group A Screen (Direct): NEGATIVE

## 2015-11-22 MED ORDER — METOCLOPRAMIDE HCL 5 MG/ML IJ SOLN
10.0000 mg | Freq: Once | INTRAMUSCULAR | Status: AC
  Administered 2015-11-22: 10 mg via INTRAMUSCULAR
  Filled 2015-11-22: qty 2

## 2015-11-22 MED ORDER — DEXAMETHASONE SODIUM PHOSPHATE 10 MG/ML IJ SOLN
10.0000 mg | Freq: Once | INTRAMUSCULAR | Status: AC
  Administered 2015-11-22: 10 mg via INTRAMUSCULAR
  Filled 2015-11-22: qty 1

## 2015-11-22 MED ORDER — DIPHENHYDRAMINE HCL 25 MG PO CAPS
25.0000 mg | ORAL_CAPSULE | Freq: Once | ORAL | Status: AC
  Administered 2015-11-22: 25 mg via ORAL
  Filled 2015-11-22: qty 1

## 2015-11-22 NOTE — ED Provider Notes (Signed)
CSN: [id number]     Arrival date & time 11/22/15  1559 History   First MD Initiated Contact with Patient 11/22/15 2013     Chief Complaint  Patient presents with  . Headache  . Dizziness     (Consider location/radiation/quality/duration/timing/severity/associated sxs/prior Treatment) HPI Comments: Patient presents with symptoms of headache described as painful pressure across the top of her head. She states she has had similar symptoms on and off for "some time" but they have been more intense and constant for 4 days. No fever, congestion, or cough. She does feel she has had a fever but reports normal temperature when she checks with a thermometer. She also complains of decreased hearing bilaterally, and decreased vision, also bilaterally. She has pain in her throat as well that does not interfere with swallowing, and feels that food tastes differently than usual. She has some dizziness, feels that surroundings are unstable. She denies falls or syncope but that she will possibly pass out. The symptoms are not worse or better with position. She has had some nausea without vomiting. No dysuria, change in bowel movements. She was seen by her doctor [facility], Dr. Gerlean Ren) on 11/18/15 and given meclizine. She takes the medication at night only because it causes her to be too sleepy to take care of her children. She does not feel that, when she takes it, there is much relief in her symptoms.   Patient is a 50 y.o. female presenting with headaches and dizziness.  Headache Associated symptoms: dizziness, hearing loss, nausea, sore throat and weakness   Associated symptoms: no abdominal pain, no congestion, no eye pain, no fever, no myalgias, no neck stiffness, no photophobia, no sinus pressure and no vomiting   Dizziness Associated symptoms: headaches, hearing loss, nausea and weakness   Associated symptoms: no vomiting     Past Medical History  Diagnosis Date  . Somatic complaints, multiple   .  Depressed   . Abnormal abdominal CT scan 01/20/2005    and pelvic CT, normal   . Abdominal ultrasound, abnormal     normal  . History of pelvic ultrasound 12/06    normal   . History of barium enema     normal  . Diabetes mellitus without complication ([place])   . Fatty liver    Past Surgical History  Procedure Laterality Date  . Appendectomy    . Caesarean section      x2  . Cholecystectomy  2009   Family History  Problem Relation Age of Onset  . Diabetes Mother   . Hypertension Mother   . Throat cancer Father     non-smoker   Social History  Substance Use Topics  . Smoking status: Never Smoker   . Smokeless tobacco: Never Used  . Alcohol Use: No   OB History    Gravida Para Term Preterm AB TAB SAB Ectopic Multiple Living   4 3 3  1   1  3      Review of Systems  Constitutional: Negative for fever and chills.  HENT: Positive for hearing loss and sore throat. Negative for congestion, sinus pressure and trouble swallowing.   Eyes: Positive for visual disturbance. Negative for photophobia and pain.  Respiratory: Negative.   Cardiovascular: Negative.   Gastrointestinal: Positive for nausea. Negative for vomiting and abdominal pain.  Genitourinary: Negative.  Negative for dysuria.  Musculoskeletal: Negative.  Negative for myalgias and neck stiffness.  Skin: Negative.   Neurological: Positive for dizziness, weakness and headaches. Negative for  syncope.      Allergies  Eggs or egg-derived products; Lactose intolerance (gi); and Soy allergy  Home Medications   Prior to Admission medications   Medication Sig Start Date End Date Taking? Authorizing Provider  acetaminophen (TYLENOL) 500 MG tablet Take 500 mg by mouth daily as needed for mild pain or headache.   Yes Historical Provider, MD  meclizine (ANTIVERT) 25 MG tablet Take 1 tablet (25 mg total) by mouth 3 (three) times daily as needed for dizziness. 11/18/15  Yes Katheren Shams, DO  metFORMIN (GLUCOPHAGE) 1000 MG  tablet Take 1 tablet (1,000 mg total) by mouth 2 (two) times daily with a meal. 09/17/15  Yes Lupita Dawn, MD  cyclobenzaprine (FLEXERIL) 10 MG tablet Take 1 tablet (10 mg total) by mouth 3 (three) times daily as needed for muscle spasms. Patient not taking: Reported on 11/22/2015 04/03/15   Burna Cash Rumley, DO  diclofenac (VOLTAREN) 75 MG EC tablet Take 1 tablet (75 mg total) by mouth 2 (two) times daily. Patient not taking: Reported on 11/22/2015 03/23/15   Waldemar Dickens, MD  hydrocortisone 2.5 % ointment Apply topically 2 (two) times daily. Patient taking differently: Apply 1 application topically daily as needed.  09/17/15   Lupita Dawn, MD  methocarbamol (ROBAXIN) 500 MG tablet Take 1-2 tablets (500-1,000 mg total) by mouth every 6 (six) hours as needed for muscle spasms. Patient not taking: Reported on 11/22/2015 03/23/15   Waldemar Dickens, MD  ondansetron (ZOFRAN ODT) 4 MG disintegrating tablet Take 1 tablet (4 mg total) by mouth every 8 (eight) hours as needed for nausea or vomiting. Patient not taking: Reported on 11/22/2015 06/11/15   Patrecia Pour, MD   BP 145/78 mmHg  Pulse 77  Temp(Src) 98 F (36.7 C) (Oral)  Resp 14  Ht 5\' 7"  (1.702 m)  SpO2 100%  LMP 11/10/2015 (Exact Date) Physical Exam  Constitutional: She is oriented to person, place, and time. She appears well-developed and well-nourished.  HENT:  Head: Normocephalic.  Right Ear: External ear normal.  Left Ear: External ear normal.  Mouth/Throat: Oropharynx is clear and moist.  Eyes: Conjunctivae are normal. Pupils are equal, round, and reactive to light.  Neck: Normal range of motion. Neck supple.  Cardiovascular: Normal rate and regular rhythm.   No carotid bruit.  Pulmonary/Chest: Effort normal and breath sounds normal.  Abdominal: Soft. Bowel sounds are normal. There is no tenderness. There is no rebound and no guarding.  Musculoskeletal: Normal range of motion.  Lymphadenopathy:    She has no cervical  adenopathy.  Neurological: She is alert and oriented to person, place, and time. She has normal strength and normal reflexes. She displays normal reflexes. No sensory deficit. She displays a negative Romberg sign. Coordination normal.  CN's 3-12 grossly intact. She is ambulatory without ataxia. Negative romberg, no pronator drift. Speech clear and focused. No deficit of memory or cognition. No nystagmus.  Skin: Skin is warm and dry. No rash noted.  Psychiatric: She has a normal mood and affect.    ED Course  Procedures (including critical care time) Labs Review Labs Reviewed  RAPID STREP SCREEN (NOT AT [facility])  CBC WITH DIFFERENTIAL/PLATELET  BASIC METABOLIC PANEL   Results for orders placed or performed during the hospital encounter of 11/22/15  Rapid strep screen  Result Value Ref Range   Streptococcus, Group A Screen (Direct) NEGATIVE NEGATIVE  CBC with Differential  Result Value Ref Range   WBC 6.6 4.0 - 10.5  K/uL   RBC 3.88 3.87 - 5.11 MIL/uL   Hemoglobin 11.2 (L) 12.0 - 15.0 g/dL   HCT 33.8 (L) 36.0 - 46.0 %   MCV 87.1 78.0 - 100.0 fL   MCH 28.9 26.0 - 34.0 pg   MCHC 33.1 30.0 - 36.0 g/dL   RDW 13.4 11.5 - 15.5 %   Platelets 253 150 - 400 K/uL   Neutrophils Relative % 44 %   Neutro Abs 2.9 1.7 - 7.7 K/uL   Lymphocytes Relative 44 %   Lymphs Abs 2.9 0.7 - 4.0 K/uL   Monocytes Relative 8 %   Monocytes Absolute 0.5 0.1 - 1.0 K/uL   Eosinophils Relative 4 %   Eosinophils Absolute 0.3 0.0 - 0.7 K/uL   Basophils Relative 0 %   Basophils Absolute 0.0 0.0 - 0.1 K/uL  Basic metabolic panel  Result Value Ref Range   Sodium 139 135 - 145 mmol/L   Potassium 4.0 3.5 - 5.1 mmol/L   Chloride 104 101 - 111 mmol/L   CO2 23 22 - 32 mmol/L   Glucose, Bld 113 (H) 65 - 99 mg/dL   BUN 7 6 - 20 mg/dL   Creatinine, Ser 0.65 0.44 - 1.00 mg/dL   Calcium 9.1 8.9 - 10.3 mg/dL   GFR calc non Af Amer >60 >60 mL/min   GFR calc Af Amer >60 >60 mL/min   Anion gap 12 5 - 15   Ct Head Wo  Contrast  11/22/2015  CLINICAL DATA:  Headache and dizziness for 4 days EXAM: CT HEAD WITHOUT CONTRAST TECHNIQUE: Contiguous axial images were obtained from the base of the skull through the vertex without intravenous contrast. COMPARISON:  12/16/2010 FINDINGS: No mass lesion. No midline shift. No acute hemorrhage or hematoma. No extra-axial fluid collections. No evidence of acute infarction. Calvarium intact. The posterior ethmoid air cell is opacified. The visualized portions of the paranasal sinuses are otherwise clear with improved aeration when compared to the prior study. IMPRESSION: No significant abnormality Electronically Signed   By: Skipper Cliche M.D.   On: 11/22/2015 21:09    Imaging Review No results found. I have personally reviewed and evaluated these images and lab results as part of my medical decision-making.   EKG Interpretation None      MDM   Final diagnoses:  None    1. Headache, nonspecific 2. Dizziness  The patient is well appearing. She has a normal neurologic exam with abnormality or deficit. She is feeling better with headache medications. No evidence of infection or acute process.  The patient is advised to follow up with her doctor for recheck and any further outpatient tests required for definitive diagnosis. DDx: vertigo vs migraine vs sinus related.    Charlann Lange, PA-C 11/22/15 2324  Forde Dandy, MD 11/23/15 [phone]

## 2015-11-22 NOTE — Discharge Instructions (Signed)
Hearing Loss Hearing loss is a partial or total loss of the ability to hear. This can be temporary or permanent, and it can happen in one or both ears. Hearing loss may be referred to as deafness. Medical care is necessary to treat hearing loss properly and to prevent the condition from getting worse. Your hearing may partially or completely come back, depending on what caused your hearing loss and how severe it is. In some cases, hearing loss is permanent. CAUSES Common causes of hearing loss include:   Too much wax in the ear canal.   Infection of the ear canal or middle ear.   Fluid in the middle ear.   Injury to the ear or surrounding area.   An object stuck in the ear.   Prolonged exposure to loud sounds, such as music.  Less common causes of hearing loss include:   Tumors in the ear.   Viral or bacterial infections, such as meningitis.   A hole in the eardrum (perforated eardrum).  Problems with the hearing nerve that sends signals between the brain and the ear.  Certain medicines.  SYMPTOMS  Symptoms of this condition may include:  Difficulty telling the difference between sounds.  Difficulty following a conversation when there is background noise.  Lack of response to sounds in your environment. This may be most noticeable when you do not respond to startling sounds.  Needing to turn up the volume on the television, radio, etc.  Ringing in the ears.  Dizziness.  Pain in the ears. DIAGNOSIS This condition is diagnosed based on a physical exam and a hearing test (audiometry). The audiometry test will be performed by a hearing specialist (audiologist). You may also be referred to an ear, nose, and throat (ENT) specialist (otolaryngologist).  TREATMENT Treatment for recent onset of hearing loss may include:   Ear wax removal.   Being prescribed medicines to prevent infection (antibiotics).   Being prescribed medicines to reduce inflammation  (corticosteroids).  HOME CARE INSTRUCTIONS  If you were prescribed an antibiotic medicine, take it as told by your health care provider. Do not stop taking the antibiotic even if you start to feel better.  Take over-the-counter and prescription medicines only as told by your health care provider.  Avoid loud noises.   Return to your normal activities as told by your health care provider. Ask your health care provider what activities are safe for you.  Keep all follow-up visits as told by your health care provider. This is important. SEEK MEDICAL CARE IF:   You feel dizzy.   You develop new symptoms.   You vomit or feel nauseous.   You have a fever.  SEEK IMMEDIATE MEDICAL CARE IF:  You develop sudden changes in your vision.   You have severe ear pain.   You have new or increased weakness.  You have a severe headache.   This information is not intended to replace advice given to you by your health care provider. Make sure you discuss any questions you have with your health care provider.   Document Released: 10/17/2005 Document Revised: 07/08/2015 Document Reviewed: 03/04/2015 Elsevier Interactive Patient Education 2016 [place] Headache Without Cause A headache is pain or discomfort felt around the head or neck area. The specific cause of a headache may not be found. There are many causes and types of headaches. A few common ones are:  Tension headaches.  Migraine headaches.  Cluster headaches.  Chronic daily headaches. HOME CARE INSTRUCTIONS  Watch your condition for any changes. Take these steps to help with your condition: Managing Pain  Take over-the-counter and prescription medicines only as told by your health care provider.  Lie down in a dark, quiet room when you have a headache.  If directed, apply ice to the head and neck area:  Put ice in a plastic bag.  Place a towel between your skin and the bag.  Leave the ice on for 20  minutes, 2-3 times per day.  Use a heating pad or hot shower to apply heat to the head and neck area as told by your health care provider.  Keep lights dim if bright lights bother you or make your headaches worse. Eating and Drinking  Eat meals on a regular schedule.  Limit alcohol use.  Decrease the amount of caffeine you drink, or stop drinking caffeine. General Instructions  Keep all follow-up visits as told by your health care provider. This is important.  Keep a headache journal to help find out what may trigger your headaches. For example, write down:  What you eat and drink.  How much sleep you get.  Any change to your diet or medicines.  Try massage or other relaxation techniques.  Limit stress.  Sit up straight, and do not tense your muscles.  Do not use tobacco products, including cigarettes, chewing tobacco, or e-cigarettes. If you need help quitting, ask your health care provider.  Exercise regularly as told by your health care provider.  Sleep on a regular schedule. Get 7-9 hours of sleep, or the amount recommended by your health care provider. SEEK MEDICAL CARE IF:   Your symptoms are not helped by medicine.  You have a headache that is different from the usual headache.  You have nausea or you vomit.  You have a fever. SEEK IMMEDIATE MEDICAL CARE IF:   Your headache becomes severe.  You have repeated vomiting.  You have a stiff neck.  You have a loss of vision.  You have problems with speech.  You have pain in the eye or ear.  You have muscular weakness or loss of muscle control.  You lose your balance or have trouble walking.  You feel faint or pass out.  You have confusion.   This information is not intended to replace advice given to you by your health care provider. Make sure you discuss any questions you have with your health care provider.   Document Released: 10/17/2005 Document Revised: 07/08/2015 Document Reviewed:  02/09/2015 Elsevier Interactive Patient Education 2016 [employer] Dizziness Dizziness is a common problem. It is a feeling of unsteadiness or light-headedness. You may feel like you are about to faint. Dizziness can lead to injury if you stumble or fall. Anyone can become dizzy, but dizziness is more common in older adults. This condition can be caused by a number of things, including medicines, dehydration, or illness. HOME CARE INSTRUCTIONS Taking these steps may help with your condition: Eating and Drinking  Drink enough fluid to keep your urine clear or pale yellow. This helps to keep you from becoming dehydrated. Try to drink more clear fluids, such as water.  Do not drink alcohol.  Limit your caffeine intake if directed by your health care provider.  Limit your salt intake if directed by your health care provider. Activity  Avoid making quick movements.  Rise slowly from chairs and steady yourself until you feel okay.  In the morning, first sit up on the side of the bed. When  you feel okay, stand slowly while you hold onto something until you know that your balance is fine.  Move your legs often if you need to stand in one place for a long time. Tighten and relax your muscles in your legs while you are standing.  Do not drive or operate heavy machinery if you feel dizzy.  Avoid bending down if you feel dizzy. Place items in your home so that they are easy for you to reach without leaning over. Lifestyle  Do not use any tobacco products, including cigarettes, chewing tobacco, or electronic cigarettes. If you need help quitting, ask your health care provider.  Try to reduce your stress level, such as with yoga or meditation. Talk with your health care provider if you need help. General Instructions  Watch your dizziness for any changes.  Take medicines only as directed by your health care provider. Talk with your health care provider if you think that your dizziness is  caused by a medicine that you are taking.  Tell a friend or a family member that you are feeling dizzy. If he or she notices any changes in your behavior, have this person call your health care provider.  Keep all follow-up visits as directed by your health care provider. This is important. SEEK MEDICAL CARE IF:  Your dizziness does not go away.  Your dizziness or light-headedness gets worse.  You feel nauseous.  You have reduced hearing.  You have new symptoms.  You are unsteady on your feet or you feel like the room is spinning. SEEK IMMEDIATE MEDICAL CARE IF:  You vomit or have diarrhea and are unable to eat or drink anything.  You have problems talking, walking, swallowing, or using your arms, hands, or legs.  You feel generally weak.  You are not thinking clearly or you have trouble forming sentences. It may take a friend or family member to notice this.  You have chest pain, abdominal pain, shortness of breath, or sweating.  Your vision changes.  You notice any bleeding.  You have a headache.  You have neck pain or a stiff neck.  You have a fever.   This information is not intended to replace advice given to you by your health care provider. Make sure you discuss any questions you have with your health care provider.   Document Released: 04/12/2001 Document Revised: 03/03/2015 Document Reviewed: 10/13/2014 Elsevier Interactive Patient Education [employer].

## 2015-11-22 NOTE — ED Notes (Addendum)
Pt c/o headache and dizziness onset Wednesday. Speech clear, no facial droop, no arm drift, equal grips.

## 2015-11-25 LAB — CULTURE, GROUP A STREP (THRC)

## 2016-02-15 ENCOUNTER — Encounter (HOSPITAL_COMMUNITY): Payer: Self-pay | Admitting: Emergency Medicine

## 2016-02-15 ENCOUNTER — Ambulatory Visit (HOSPITAL_COMMUNITY)
Admission: EM | Admit: 2016-02-15 | Discharge: 2016-02-15 | Disposition: A | Discharge status: Home / Self Care | Payer: BLUE CROSS/BLUE SHIELD | Attending: Family Medicine | Admitting: Family Medicine

## 2016-02-15 DIAGNOSIS — J029 Acute pharyngitis, unspecified: Secondary | ICD-10-CM

## 2016-02-15 DIAGNOSIS — H109 Unspecified conjunctivitis: Secondary | ICD-10-CM

## 2016-02-15 DIAGNOSIS — J069 Acute upper respiratory infection, unspecified: Secondary | ICD-10-CM

## 2016-02-15 MED ORDER — AMOXICILLIN 500 MG PO CAPS: 500.0000 mg | ORAL_CAPSULE | Freq: Three times a day (TID) | ORAL | Status: DC

## 2016-02-15 NOTE — Discharge Instructions (Signed)
Cough, Adult A cough helps to clear your throat and lungs. A cough may last only 2-3 weeks (acute), or it may last longer than 8 weeks (chronic). Many different things can cause a cough. A cough may be a sign of an illness or another medical condition. HOME CARE  Pay attention to any changes in your cough.  Take medicines only as told by your doctor.  If you were prescribed an antibiotic medicine, take it as told by your doctor. Do not stop taking it even if you start to feel better.  Talk with your doctor before you try using a cough medicine.  Drink enough fluid to keep your pee (urine) clear or pale yellow.  If the air is dry, use a cold steam vaporizer or humidifier in your home.  Stay away from things that make you cough at work or at home.  If your cough is worse at night, try using extra pillows to raise your head up higher while you sleep.  Do not smoke, and try not to be around smoke. If you need help quitting, ask your doctor.  Do not have caffeine.  Do not drink alcohol.  Rest as needed. GET HELP IF:  You have new problems (symptoms).  You cough up yellow fluid (pus).  Your cough does not get better after 2-3 weeks, or your cough gets worse.  Medicine does not help your cough and you are not sleeping well.  You have pain that gets worse or pain that is not helped with medicine.  You have a fever.  You are losing weight and you do not know why.  You have night sweats. GET HELP RIGHT AWAY IF:  You cough up blood.  You have trouble breathing.  Your heartbeat is very fast.   This information is not intended to replace advice given to you by your health care provider. Make sure you discuss any questions you have with your health care provider.   Document Released: 06/30/2011 Document Revised: 07/08/2015 Document Reviewed: 12/24/2014 Elsevier Interactive Patient Education 2016 Elsevier Inc[employer]  Bacterial Conjunctivitis Bacterial conjunctivitis, commonly  called pink eye, is an inflammation of the clear membrane that covers the white part of the eye (conjunctiva). The inflammation can also happen on the underside of the eyelids. The blood vessels in the conjunctiva become inflamed, causing the eye to become red or pink. Bacterial conjunctivitis may spread easily from one eye to another and from person to person (contagious).  CAUSES  Bacterial conjunctivitis is caused by bacteria. The bacteria may come from your own skin, your upper respiratory tract, or from someone else with bacterial conjunctivitis. SYMPTOMS  The normally white color of the eye or the underside of the eyelid is usually pink or red. The pink eye is usually associated with irritation, tearing, and some sensitivity to light. Bacterial conjunctivitis is often associated with a thick, yellowish discharge from the eye. The discharge may turn into a crust on the eyelids overnight, which causes your eyelids to stick together. If a discharge is present, there may also be some blurred vision in the affected eye. DIAGNOSIS  Bacterial conjunctivitis is diagnosed by your caregiver through an eye exam and the symptoms that you report. Your caregiver looks for changes in the surface tissues of your eyes, which may point to the specific type of conjunctivitis. A sample of any discharge may be collected on a cotton-tip swab if you have a severe case of conjunctivitis, if your cornea is affected, or if you  keep getting repeat infections that do not respond to treatment. The sample will be sent to a lab to see if the inflammation is caused by a bacterial infection and to see if the infection will respond to antibiotic medicines. TREATMENT   Bacterial conjunctivitis is treated with antibiotics. Antibiotic eyedrops are most often used. However, antibiotic ointments are also available. Antibiotics pills are sometimes used. Artificial tears or eye washes may ease discomfort. HOME CARE INSTRUCTIONS   To ease  discomfort, apply a cool, clean washcloth to your eye for 10-20 minutes, 3-4 times a day.  Gently wipe away any drainage from your eye with a warm, wet washcloth or a cotton ball.  Wash your hands often with soap and water. Use paper towels to dry your hands.  Do not share towels or washcloths. This may spread the infection.  Change or wash your pillowcase every day.  You should not use eye makeup until the infection is gone.  Do not operate machinery or drive if your vision is blurred.  Stop using contact lenses. Ask your caregiver how to sterilize or replace your contacts before using them again. This depends on the type of contact lenses that you use.  When applying medicine to the infected eye, do not touch the edge of your eyelid with the eyedrop bottle or ointment tube. SEEK IMMEDIATE MEDICAL CARE IF:   Your infection has not improved within 3 days after beginning treatment.  You had yellow discharge from your eye and it returns.  You have increased eye pain.  Your eye redness is spreading.  Your vision becomes blurred.  You have a fever or persistent symptoms for more than 2-3 days.  You have a fever and your symptoms suddenly get worse.  You have facial pain, redness, or swelling. MAKE SURE YOU:   Understand these instructions.  Will watch your condition.  Will get help right away if you are not doing well or get worse.   This information is not intended to replace advice given to you by your health care provider. Make sure you discuss any questions you have with your health care provider.   Document Released: 10/17/2005 Document Revised: 11/07/2014 Document Reviewed: 03/19/2012 Elsevier Interactive Patient Education 2016 Elsevier Inc[employer] Pharyngitis Pharyngitis is a sore throat (pharynx). There is redness, pain, and swelling of your throat. HOME CARE   Drink enough fluids to keep your pee (urine) clear or pale yellow.  Only take medicine as told by your  doctor.  You may get sick again if you do not take medicine as told. Finish your medicines, even if you start to feel better.  Do not take aspirin.  Rest.  Rinse your mouth (gargle) with salt water ( tsp of salt per 1 qt of water) every 1-2 hours. This will help the pain.  If you are not at risk for choking, you can suck on hard candy or sore throat lozenges. GET HELP IF:  You have large, tender lumps on your neck.  You have a rash.  You cough up green, yellow-brown, or bloody spit. GET HELP RIGHT AWAY IF:   You have a stiff neck.  You drool or cannot swallow liquids.  You throw up (vomit) or are not able to keep medicine or liquids down.  You have very bad pain that does not go away with medicine.  You have problems breathing (not from a stuffy nose). MAKE SURE YOU:   Understand these instructions.  Will watch your condition.  Will get  help right away if you are not doing well or get worse.   This information is not intended to replace advice given to you by your health care provider. Make sure you discuss any questions you have with your health care provider.   Document Released: 04/04/2008 Document Revised: 08/07/2013 Document Reviewed: 06/24/2013 Elsevier Interactive Patient Education 2016 Elsevier Inc. Upper Respiratory Infection, Adult Most upper respiratory infections (URIs) are caused by a virus. A URI affects the nose, throat, and upper air passages. The most common type of URI is often called "the common cold." HOME CARE   Take medicines only as told by your doctor.  Gargle warm saltwater or take cough drops to comfort your throat as told by your doctor.  Use a warm mist humidifier or inhale steam from a shower to increase air moisture. This may make it easier to breathe.  Drink enough fluid to keep your pee (urine) clear or pale yellow.  Eat soups and other clear broths.  Have a healthy diet.  Rest as needed.  Go back to work when your fever is  gone or your doctor says it is okay.  You may need to stay home longer to avoid giving your URI to others.  You can also wear a face mask and wash your hands often to prevent spread of the virus.  Use your inhaler more if you have asthma.  Do not use any tobacco products, including cigarettes, chewing tobacco, or electronic cigarettes. If you need help quitting, ask your doctor. GET HELP IF:  You are getting worse, not better.  Your symptoms are not helped by medicine.  You have chills.  You are getting more short of breath.  You have brown or red mucus.  You have yellow or brown discharge from your nose.  You have pain in your face, especially when you bend forward.  You have a fever.  You have puffy (swollen) neck glands.  You have pain while swallowing.  You have white areas in the back of your throat. GET HELP RIGHT AWAY IF:   You have very bad or constant:  Headache.  Ear pain.  Pain in your forehead, behind your eyes, and over your cheekbones (sinus pain).  Chest pain.  You have long-lasting (chronic) lung disease and any of the following:  Wheezing.  Long-lasting cough.  Coughing up blood.  A change in your usual mucus.  You have a stiff neck.  You have changes in your:  Vision.  Hearing.  Thinking.  Mood. MAKE SURE YOU:   Understand these instructions.  Will watch your condition.  Will get help right away if you are not doing well or get worse.   This information is not intended to replace advice given to you by your health care provider. Make sure you discuss any questions you have with your health care provider.   Document Released: 04/04/2008 Document Revised: 03/03/2015 Document Reviewed: 01/22/2014 Elsevier Interactive Patient Education [employer].

## 2016-02-15 NOTE — ED Notes (Signed)
The patient presented to the [facility] with a complaint of a sore throat, right eye drainage and left ear pain x 2 weeks.

## 2016-02-16 NOTE — ED Provider Notes (Signed)
CSN: [id number]     Arrival date & time 02/15/16  1350 History   First MD Initiated Contact with Patient 02/15/16 1645     Chief Complaint  Patient presents with  . Sore Throat  . Eye Drainage  . Otalgia   (Consider location/radiation/quality/duration/timing/severity/associated sxs/prior Treatment) HPI Cough, cold with crusty left eye and sore throat for 3 days. Pain score 3, OTC without relief of symptoms. No fever, vomiting.  Past Medical History  Diagnosis Date  . Somatic complaints, multiple   . Depressed   . Abnormal abdominal CT scan 01/20/2005    and pelvic CT, normal   . Abdominal ultrasound, abnormal     normal  . History of pelvic ultrasound 12/06    normal   . History of barium enema     normal  . Diabetes mellitus without complication ([place])   . Fatty liver    Past Surgical History  Procedure Laterality Date  . Appendectomy    . Caesarean section      x2  . Cholecystectomy  2009   Family History  Problem Relation Age of Onset  . Diabetes Mother   . Hypertension Mother   . Throat cancer Father     non-smoker   Social History  Substance Use Topics  . Smoking status: Never Smoker   . Smokeless tobacco: Never Used  . Alcohol Use: No   OB History    Gravida Para Term Preterm AB TAB SAB Ectopic Multiple Living   4 3 3  1   1  3      Review of Systems URI symptoms Allergies  Eggs or egg-derived products; Lactose intolerance (gi); and Soy allergy  Home Medications   Prior to Admission medications   Medication Sig Start Date End Date Taking? Authorizing Provider  metFORMIN (GLUCOPHAGE) 1000 MG tablet Take 1 tablet (1,000 mg total) by mouth 2 (two) times daily with a meal. 09/17/15  Yes Lupita Dawn, MD  acetaminophen (TYLENOL) 500 MG tablet Take 500 mg by mouth daily as needed for mild pain or headache.    Historical Provider, MD  amoxicillin (AMOXIL) 500 MG capsule Take 1 capsule (500 mg total) by mouth 3 (three) times daily. 02/15/16   Konrad Felix,  PA  cyclobenzaprine (FLEXERIL) 10 MG tablet Take 1 tablet (10 mg total) by mouth 3 (three) times daily as needed for muscle spasms. Patient not taking: Reported on 11/22/2015 04/03/15   Burna Cash Rumley, DO  diclofenac (VOLTAREN) 75 MG EC tablet Take 1 tablet (75 mg total) by mouth 2 (two) times daily. Patient not taking: Reported on 11/22/2015 03/23/15   Waldemar Dickens, MD  hydrocortisone 2.5 % ointment Apply topically 2 (two) times daily. Patient taking differently: Apply 1 application topically daily as needed.  09/17/15   Lupita Dawn, MD  meclizine (ANTIVERT) 25 MG tablet Take 1 tablet (25 mg total) by mouth 3 (three) times daily as needed for dizziness. 11/18/15   Katheren Shams, DO  methocarbamol (ROBAXIN) 500 MG tablet Take 1-2 tablets (500-1,000 mg total) by mouth every 6 (six) hours as needed for muscle spasms. Patient not taking: Reported on 11/22/2015 03/23/15   Waldemar Dickens, MD  ondansetron (ZOFRAN ODT) 4 MG disintegrating tablet Take 1 tablet (4 mg total) by mouth every 8 (eight) hours as needed for nausea or vomiting. Patient not taking: Reported on 11/22/2015 06/11/15   Patrecia Pour, MD   Meds Ordered and Administered this Visit  Medications - No  data to display  BP 150/101 mmHg  Pulse 70  Temp(Src) 98.2 F (36.8 C) (Oral)  Resp 16  SpO2 100%  LMP 12/06/2015 (Exact Date) No data found.   Physical Exam NURSES NOTES AND VITAL SIGNS REVIEWED. CONSTITUTIONAL: Well developed, well nourished, no acute distress HEENT: normocephalic, atraumatic, right and left TM's are normal, THROAT injected without exudate EYES: Left Conjunctiva normal, Right is crusty, red, with puffy eyelid.  NECK:normal ROM, supple, no adenopathy PULMONARY:No respiratory distress, normal effort, Lungs: CTAb/l, no wheezes, or increased work of breathing CARDIOVASCULAR: RRR, no murmur ABDOMEN: soft, ND, NT, +'ve BS MUSCULOSKELETAL: Normal ROM of all extremities,  SKIN: warm and dry without rash PSYCHIATRIC:  Mood and affect, behavior are normal  ED Course  Procedures (including critical care time)  Labs Review Labs Reviewed - No data to display  Imaging Review No results found.   Visual Acuity Review  Right Eye Distance:   Left Eye Distance:   Bilateral Distance:    Right Eye Near:   Left Eye Near:    Bilateral Near:      RX amoxil   MDM   1. URI (upper respiratory infection)   2. Pharyngitis   3. Conjunctivitis of right eye     Patient is reassured that there are no issues that require transfer to higher level of care at this time or additional tests. Patient is advised to continue home symptomatic treatment. Patient is advised that if there are new or worsening symptoms to attend the emergency department, contact primary care provider, or return to UC. Instructions of care provided discharged home in stable condition.    THIS NOTE WAS GENERATED USING A VOICE RECOGNITION SOFTWARE PROGRAM. ALL REASONABLE EFFORTS  WERE MADE TO PROOFREAD THIS DOCUMENT FOR ACCURACY.  I have verbally reviewed the discharge instructions with the patient. A printed AVS was given to the patient.  All questions were answered prior to discharge.      Konrad Felix, [place] 02/16/16 [phone]

## 2016-02-25 ENCOUNTER — Encounter (HOSPITAL_COMMUNITY): Payer: Self-pay | Admitting: Emergency Medicine

## 2016-02-25 ENCOUNTER — Ambulatory Visit (HOSPITAL_COMMUNITY)
Admission: EM | Admit: 2016-02-25 | Discharge: 2016-02-25 | Disposition: A | Discharge status: Home / Self Care | Payer: BLUE CROSS/BLUE SHIELD | Attending: Physician Assistant | Admitting: Physician Assistant

## 2016-02-25 DIAGNOSIS — E069 Thyroiditis, unspecified: Secondary | ICD-10-CM | POA: Diagnosis not present

## 2016-02-25 DIAGNOSIS — E119 Type 2 diabetes mellitus without complications: Secondary | ICD-10-CM | POA: Insufficient documentation

## 2016-02-25 DIAGNOSIS — Z833 Family history of diabetes mellitus: Secondary | ICD-10-CM | POA: Insufficient documentation

## 2016-02-25 DIAGNOSIS — Z8249 Family history of ischemic heart disease and other diseases of the circulatory system: Secondary | ICD-10-CM | POA: Insufficient documentation

## 2016-02-25 DIAGNOSIS — Z7984 Long term (current) use of oral hypoglycemic drugs: Secondary | ICD-10-CM | POA: Insufficient documentation

## 2016-02-25 DIAGNOSIS — J029 Acute pharyngitis, unspecified: Secondary | ICD-10-CM | POA: Diagnosis not present

## 2016-02-25 DIAGNOSIS — Z79899 Other long term (current) drug therapy: Secondary | ICD-10-CM | POA: Insufficient documentation

## 2016-02-25 LAB — POCT RAPID STREP A: Streptococcus, Group A Screen (Direct): NEGATIVE

## 2016-02-25 MED ORDER — AZITHROMYCIN 250 MG PO TABS: ORAL_TABLET | ORAL | Status: DC

## 2016-02-25 MED ORDER — PREDNISONE 20 MG PO TABS: 40.0000 mg | ORAL_TABLET | Freq: Every day | ORAL | Status: DC

## 2016-02-25 NOTE — ED Provider Notes (Signed)
CSN: [id number]     Arrival date & time 02/25/16  1518 History   None    Chief Complaint  Patient presents with  . Sore Throat   (Consider location/radiation/quality/duration/timing/severity/associated sxs/prior Treatment) HPI History obtained from patient:  Pt presents with the cc of: sore throat Duration of symptoms:1 week Treatment prior to arrival: OTC meds at home,  Context:recent treatment for conjunctivitis, pharyngitis Other symptoms include: hurts to swallow Pain score:4 FAMILY HISTORY: DM mother SOCIAL HISTORY: non smoker, no etoh  Past Medical History  Diagnosis Date  . Somatic complaints, multiple   . Depressed   . Abnormal abdominal CT scan 01/20/2005    and pelvic CT, normal   . Abdominal ultrasound, abnormal     normal  . History of pelvic ultrasound 12/06    normal   . History of barium enema     normal  . Diabetes mellitus without complication ([place])   . Fatty liver    Past Surgical History  Procedure Laterality Date  . Appendectomy    . Caesarean section      x2  . Cholecystectomy  2009   Family History  Problem Relation Age of Onset  . Diabetes Mother   . Hypertension Mother   . Throat cancer Father     non-smoker   Social History  Substance Use Topics  . Smoking status: Never Smoker   . Smokeless tobacco: Never Used  . Alcohol Use: No   OB History    Gravida Para Term Preterm AB TAB SAB Ectopic Multiple Living   4 3 3  1   1  3      Review of Systems ROS +'ve sore throat  Denies: HEADACHE, NAUSEA, ABDOMINAL PAIN, CHEST PAIN, CONGESTION, DYSURIA, SHORTNESS OF BREATH  Allergies  Eggs or egg-derived products; Lactose intolerance (gi); and Soy allergy  Home Medications   Prior to Admission medications   Medication Sig Start Date End Date Taking? Authorizing Provider  metFORMIN (GLUCOPHAGE) 1000 MG tablet Take 1 tablet (1,000 mg total) by mouth 2 (two) times daily with a meal. 09/17/15  Yes Lupita Dawn, MD  acetaminophen (TYLENOL)  500 MG tablet Take 500 mg by mouth daily as needed for mild pain or headache.    Historical Provider, MD  amoxicillin (AMOXIL) 500 MG capsule Take 1 capsule (500 mg total) by mouth 3 (three) times daily. 02/15/16   Konrad Felix, PA  cyclobenzaprine (FLEXERIL) 10 MG tablet Take 1 tablet (10 mg total) by mouth 3 (three) times daily as needed for muscle spasms. Patient not taking: Reported on 11/22/2015 04/03/15   Burna Cash Rumley, DO  diclofenac (VOLTAREN) 75 MG EC tablet Take 1 tablet (75 mg total) by mouth 2 (two) times daily. Patient not taking: Reported on 11/22/2015 03/23/15   Waldemar Dickens, MD  hydrocortisone 2.5 % ointment Apply topically 2 (two) times daily. Patient taking differently: Apply 1 application topically daily as needed.  09/17/15   Lupita Dawn, MD  meclizine (ANTIVERT) 25 MG tablet Take 1 tablet (25 mg total) by mouth 3 (three) times daily as needed for dizziness. 11/18/15   Katheren Shams, DO  methocarbamol (ROBAXIN) 500 MG tablet Take 1-2 tablets (500-1,000 mg total) by mouth every 6 (six) hours as needed for muscle spasms. Patient not taking: Reported on 11/22/2015 03/23/15   Waldemar Dickens, MD  ondansetron (ZOFRAN ODT) 4 MG disintegrating tablet Take 1 tablet (4 mg total) by mouth every 8 (eight) hours as needed for nausea  or vomiting. Patient not taking: Reported on 11/22/2015 06/11/15   Patrecia Pour, MD   Meds Ordered and Administered this Visit  Medications - No data to display  BP 150/79 mmHg  Pulse 74  Temp(Src) 98.1 F (36.7 C) (Oral)  SpO2 100%  LMP 12/06/2015 (Exact Date) No data found.   Physical Exam NURSES NOTES AND VITAL SIGNS REVIEWED. CONSTITUTIONAL: Well developed, well nourished, no acute distress HEENT: normocephalic, atraumatic, Mild injection. No exudate. Tender along thyroid.  EYES: Conjunctiva normal NECK:normal ROM, supple, no adenopathy PULMONARY:No respiratory distress, normal effort ABDOMINAL: Soft, ND, NT BS+, No CVAT MUSCULOSKELETAL:  Normal ROM of all extremities,  SKIN: warm and dry without rash PSYCHIATRIC: Mood and affect, behavior are normal  ED Course  Procedures (including critical care time)  Labs Review Labs Reviewed  POCT RAPID STREP A   I HAVE PERSONALLY REVIEWED TESTS RESULTS WITH PATIENT.  Imaging Review No results found.   Visual Acuity Review  Right Eye Distance:   Left Eye Distance:   Bilateral Distance:    Right Eye Near:   Left Eye Near:    Bilateral Near:     Total Visit Time:20 "GREATER THAN 50% WAS SPENT IN COUNSELING AND COORDINATION OF CARE WITH THE PATIENT" DISCUSSION OF PREVIOUS TREATMENT AND NEW TREATMENT AND THE NEED TO FOLLOW UP   rx PREDNISONE AND AZITHROMYCIN  MDM   1. Sore throat   2. Thyroiditis     Patient is reassured that there are no issues that require transfer to higher level of care at this time or additional tests. Patient is advised to continue home symptomatic treatment. Patient is advised that if there are new or worsening symptoms to attend the emergency department, contact primary care provider, or return to UC. Instructions of care provided discharged home in stable condition.    THIS NOTE WAS GENERATED USING A VOICE RECOGNITION SOFTWARE PROGRAM. ALL REASONABLE EFFORTS  WERE MADE TO PROOFREAD THIS DOCUMENT FOR ACCURACY.  I have verbally reviewed the discharge instructions with the patient. A printed AVS was given to the patient.  All questions were answered prior to discharge.      Konrad Felix, PA 02/25/16 [place], PA 02/25/16 [phone]

## 2016-02-25 NOTE — Discharge Instructions (Signed)
Sore Throat A sore throat is pain, burning, irritation, or scratchiness of the throat. There is often pain or tenderness when swallowing or talking. A sore throat may be accompanied by other symptoms, such as coughing, sneezing, fever, and swollen neck glands. A sore throat is often the first sign of another sickness, such as a cold, flu, strep throat, or mononucleosis (commonly known as mono). Most sore throats go away without medical treatment. CAUSES  The most common causes of a sore throat include:  A viral infection, such as a cold, flu, or mono.  A bacterial infection, such as strep throat, tonsillitis, or whooping cough.  Seasonal allergies.  Dryness in the air.  Irritants, such as smoke or pollution.  Gastroesophageal reflux disease (GERD). HOME CARE INSTRUCTIONS   Only take over-the-counter medicines as directed by your caregiver.  Drink enough fluids to keep your urine clear or pale yellow.  Rest as needed.  Try using throat sprays, lozenges, or sucking on hard candy to ease any pain (if older than 4 years or as directed).  Sip warm liquids, such as broth, herbal tea, or warm water with honey to relieve pain temporarily. You may also eat or drink cold or frozen liquids such as frozen ice pops.  Gargle with salt water (mix 1 tsp salt with 8 oz of water).  Do not smoke and avoid secondhand smoke.  Put a cool-mist humidifier in your bedroom at night to moisten the air. You can also turn on a hot shower and sit in the bathroom with the door closed for 5-10 minutes. SEEK IMMEDIATE MEDICAL CARE IF:  You have difficulty breathing.  You are unable to swallow fluids, soft foods, or your saliva.  You have increased swelling in the throat.  Your sore throat does not get better in 7 days.  You have nausea and vomiting.  You have a fever or persistent symptoms for more than 2-3 days.  You have a fever and your symptoms suddenly get worse. MAKE SURE YOU:   Understand  these instructions.  Will watch your condition.  Will get help right away if you are not doing well or get worse.   This information is not intended to replace advice given to you by your health care provider. Make sure you discuss any questions you have with your health care provider.   Document Released: 11/24/2004 Document Revised: 11/07/2014 Document Reviewed: 06/24/2012 Elsevier Interactive Patient Education 2016 [place] THYROID COULD BE INFLAMED. YOU WILL NEED TO SEE YOUR DOCTOR NEXT WEEK FOR FOLLOW UP.  YOU HAVE BEEN PRESCRIBED 2 NEW MEDICATIONS. AZITHROMYCIN WHICH IS AN ANTIBX. AND PREDNISONE TO HELP DECREASE INFLAMMATION AND PAIN.   YOU NEED TO FOLLOW UP WITH YOUR DOCTOR NEXT WEEK IF SYMPTOMS ARE NOT GETTING BETTER.

## 2016-02-25 NOTE — ED Notes (Signed)
Pt was seen 10 days ago and treated for pharyngitis, URI, and conjunctivitis with amoxil.  She finished her prescription yesterday.  She presents today with worsening sore throat.  She states it is hard to swallow and she has a productive cough with a small amount of blood in her sputum in the mornings.

## 2016-02-28 LAB — CULTURE, GROUP A STREP (THRC)

## 2016-05-02 ENCOUNTER — Encounter: Payer: Self-pay | Admitting: Family Medicine

## 2016-05-02 ENCOUNTER — Ambulatory Visit (HOSPITAL_COMMUNITY): Payer: Medicaid Other | Attending: Family Medicine

## 2016-05-02 ENCOUNTER — Ambulatory Visit (INDEPENDENT_AMBULATORY_CARE_PROVIDER_SITE_OTHER): Payer: Medicaid Other | Admitting: Family Medicine

## 2016-05-02 VITALS — BP 149/75 | HR 76 | Temp 97.8°F | Wt 142.6 lb

## 2016-05-02 DIAGNOSIS — R9431 Abnormal electrocardiogram [ECG] [EKG]: Secondary | ICD-10-CM | POA: Diagnosis not present

## 2016-05-02 DIAGNOSIS — I456 Pre-excitation syndrome: Secondary | ICD-10-CM

## 2016-05-02 DIAGNOSIS — J029 Acute pharyngitis, unspecified: Secondary | ICD-10-CM | POA: Diagnosis not present

## 2016-05-02 DIAGNOSIS — E119 Type 2 diabetes mellitus without complications: Secondary | ICD-10-CM

## 2016-05-02 LAB — POCT RAPID STREP A (OFFICE): Rapid Strep A Screen: NEGATIVE

## 2016-05-02 LAB — POCT GLYCOSYLATED HEMOGLOBIN (HGB A1C): HEMOGLOBIN A1C: 6.9

## 2016-05-02 MED ORDER — METFORMIN HCL 1000 MG PO TABS: 1000.0000 mg | ORAL_TABLET | Freq: Two times a day (BID) | ORAL | Status: DC

## 2016-05-02 NOTE — Patient Instructions (Signed)
Thank you so much for coming to visit today! Your sore throat is most likely viral. You may try cough drops or chloraseptic spray for relief. Honey should also help. If it continues to worsen or fails to get better over the next week, please return.  Your chest pain is more concerning to me. I have placed a referral to Cardiology. I have ordered a blood test and an Echocardiogram to further evaluate your symptoms. If you develop chest pain again, please call 911 to go to the ED.  Thanks! Dr. Gerlean Ren

## 2016-05-03 LAB — BRAIN NATRIURETIC PEPTIDE: Brain Natriuretic Peptide: 26 pg/mL (ref <100)

## 2016-05-03 NOTE — Progress Notes (Signed)
Subjective:     Patient ID: [name], female   DOB: [birth year], 50 y.o.   MRN: [id number]  HPI [name] is a 50yo female presenting for sore throat. Also subsequently reports intermittent left chest pain.  # Sore Throat: - Noted similar symptoms in April twice, resolved following course of Amoxicillin and then Azithromycin + Prednisone.  - Symptoms started Saturday 04/30/16 - Difficult to swallow due to pain - Notes some radiation to left ear - Also reports nonproductive cough - Denies fever, nausea, vomiting, diarrhea, sick contacts  # Chest Pain: - Not currently present - Reports pain located over left side of chest and radiates down left arm - Occurs intermittently. Last episode Sunday 04/24/16 - Will last 1-3 days before resolving on its own - Describes pain as sharp, like something is stabbing her in the chest - Notes shortness of breath with episodes - Notes palpitations during episodes - Denies diaphoresis - Has been occuring intermittently for two years. Seems to be occuring more often - Reports orthopnea, requiring 3 pillows. Also notes paroxysmal nocturnal dyspnea. - Saw Cardiology in 2012 for Wolff-Parkinson-White. Plan was for watching waiting since she was asymptomatic at that time.  # Diabetes: - Last A1C 6.7 in 11/2015 - Currently prescribed Metformin 1000mg  BID   - Nonsmoker  Review of Systems Per HPI. Other systems negative.    Objective:   Physical Exam  Constitutional: She appears well-developed and well-nourished. No distress.  HENT:  Pharynx erythematous with mild exudate along right, Tympanic membranes without signs of erythema or effusion  Cardiovascular: Normal rate and regular rhythm.  Exam reveals no gallop and no friction rub.   No murmur heard. Pulmonary/Chest: Effort normal. No respiratory distress. She has no wheezes.  Abdominal: Soft. She exhibits no distension. There is no tenderness.  Psychiatric: She has a normal mood and affect.  Her behavior is normal.      Assessment and Plan:    WOLFF (WOLFE)-PARKINSON-WHITE (WPW) SYNDROME - Previously asymptomatic. Now with intermittent chest pain with radiation, palpitations, shortness of breath, orthopnea requiring 3 pillows and paroxysmal nocturnal dyspnea. - EKG with Wolff-Parkinson White.  - Referral to Cardiology. Anticipate possible need for catheter ablation - Will obtain BNP and Echocardiogram  - To call 911 if chest pain recurs   Diabetes mellitus - A1C 6.9, increased from 6.7 in 11/2015 - Continue to work on diet and exercise - Continue Metformin - Follow up in 3 months   Sore Throat - Rapid Strep Negative - Will not give antibiotics at this time as most likely viral. - Symptomatic management with cough drops, Chloraseptic spray, honey - Follow up if no improvement over the next 1-2 weeks

## 2016-05-03 NOTE — Assessment & Plan Note (Signed)
-   A1C 6.9, increased from 6.7 in 11/2015 - Continue to work on diet and exercise - Continue Metformin - Follow up in 3 months

## 2016-05-03 NOTE — Assessment & Plan Note (Signed)
-   Previously asymptomatic. Now with intermittent chest pain with radiation, palpitations, shortness of breath, orthopnea requiring 3 pillows and paroxysmal nocturnal dyspnea. - EKG with Wolff-Parkinson White.  - Referral to Cardiology. Anticipate possible need for catheter ablation - Will obtain BNP and Echocardiogram  - To call 911 if chest pain recurs

## 2016-05-13 ENCOUNTER — Encounter (INDEPENDENT_AMBULATORY_CARE_PROVIDER_SITE_OTHER): Payer: Self-pay

## 2016-05-13 ENCOUNTER — Encounter: Payer: Self-pay | Admitting: Cardiology

## 2016-05-13 ENCOUNTER — Ambulatory Visit (INDEPENDENT_AMBULATORY_CARE_PROVIDER_SITE_OTHER): Payer: Medicaid Other | Admitting: Cardiology

## 2016-05-13 VITALS — BP 130/66 | HR 74 | Ht 65.0 in | Wt 142.0 lb

## 2016-05-13 DIAGNOSIS — I456 Pre-excitation syndrome: Secondary | ICD-10-CM | POA: Diagnosis not present

## 2016-05-13 NOTE — Patient Instructions (Addendum)
Medication Instructions:  Your physician recommends that you continue on your current medications as directed. Please refer to the Current Medication list given to you today.   Labwork: None ordered  Testing/Procedures: None ordered  Follow-Up: Your physician recommends that you schedule a follow-up appointment in: 05/26/16 ARRIVE AT 10:45 FOR AN APPOINTMENT WITH DR. Lovena Le   Any Other Special Instructions Will Be Listed Below (If Applicable).     If you need a refill on your cardiac medications before your next appointment, please call your pharmacy.

## 2016-05-13 NOTE — Progress Notes (Signed)
05/13/2016 [name]   01/17/1966  [id number]  Primary Physician Junie Panning, DO Primary Cardiologist: New (seen by Dr. Lovena Le in 2012) Electrophysiologist: Dr. Lovena Le   Reason for Visit/CC: New Patient Eval for WPW syndrome  HPI:  50 y/o female from [place] with h/o DM and WPW syndrome, referred by her PCP, Dr Gerlean Ren, for consideration for cathter ablation.   She was actually seen by Dr. Lovena Le in 2012 for this but was completely asymptomatic at the time. Dr. Lovena Le outlined that if she were to develop palpitations, cathter ablation could be considered.   She now presents back to clinic with complaint of palpitations that have been occurring off and on for the past year, and have progressively worsened. She notes tachypalpiations that can last from several minutes to several hours. Symptoms can occur at rest. She also feels short of breath when this happens and occasionally feels dizzy but she denies syncope/ near syncope. She avoids caffeine. She is currently asymptomatic. EKG at PCP office 05/02/16 demonstrates delta waves.    Current Outpatient Prescriptions  Medication Sig Dispense Refill  . acetaminophen (TYLENOL) 500 MG tablet Take 500 mg by mouth daily as needed for mild pain or headache.    . metFORMIN (GLUCOPHAGE) 1000 MG tablet Take 1 tablet (1,000 mg total) by mouth 2 (two) times daily with a meal. 180 tablet 1   No current facility-administered medications for this visit.    Allergies  Allergen Reactions  . Eggs Or Egg-Derived Products     Not allergic but get abd discomfort   . Lactose Intolerance (Gi) Diarrhea  . Soy Allergy     Not allergic but get abd discomfort    Social History   Social History  . Marital Status: Married    Spouse Name: N/A  . Number of Children: 3  . Years of Education: N/A   Occupational History  . Not on file.   Social History Main Topics  . Smoking status: Never Smoker   . Smokeless tobacco: Never Used  . Alcohol Use: No   . Drug Use: No  . Sexual Activity: Not Currently   Other Topics Concern  . Not on file   Social History Narrative   Originally from [place]. 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 Lives at home with husband and 3 children.      Review of Systems: General: negative for chills, fever, night sweats or weight changes.  Cardiovascular: negative for chest pain, dyspnea on exertion, edema, orthopnea, + for palpitations, no paroxysmal nocturnal dyspnea or shortness of breath Dermatological: negative for rash Respiratory: negative for cough or wheezing Urologic: negative for hematuria Abdominal: negative for nausea, vomiting, diarrhea, bright red blood per rectum, melena, or hematemesis Neurologic: negative for visual changes, syncope, or dizziness All other systems reviewed and are otherwise negative except as noted above.    Blood pressure 130/66, pulse 74, height 5\' 5"  (1.651 m), weight 142 lb (64.411 kg), last menstrual period 12/06/2015, SpO2 99 %.  General appearance: alert, cooperative and no distress Neck: no carotid bruit, no JVD and thyroid not enlarged, symmetric, no tenderness/mass/nodules Lungs: clear to auscultation bilaterally Heart: regular rate and rhythm, S1, S2 normal, no murmur, click, rub or gallop Extremities: extremities normal, atraumatic, no cyanosis or edema Pulses: 2+ and symmetric Skin: Skin color, texture, turgor normal. No rashes or lesions Neurologic: Grossly normal  EKG 05/02/16 NSR with delta waves c/w WPW  ASSESSMENT AND PLAN:   1.  WPW Syndrome: patient has been experiencing symptoms with tachypalpiations. We will refer her back to Dr. Lovena Le, EP, for consideration for catheter ablation. She is not having any high-risk symptoms such as syncope. No chest pain. This is very similar to her previous evaluation in 2012.  PLAN  EP referral.   Lyda Jester PA-C 05/13/2016 58:40 AM  50 year old female with WPW on EKG,  previously seen by Dr. Lovena Le in 2012. Now with repeat palpitations. No high-risk symptoms such as syncope. Palpitations can last a few minutes duration she states. She is sent back to clinic today for further evaluation.  Exam unremarkable, regular rate and rhythm, lungs clear. See above for further details.  EKG as above, delta waves  EP evaluation with Dr. Lovena Le who has seen her in 2012 for possible ablation of WPW pathway versus alternative management. Candee Furbish, MD

## 2016-05-26 ENCOUNTER — Ambulatory Visit (INDEPENDENT_AMBULATORY_CARE_PROVIDER_SITE_OTHER): Payer: Medicaid Other | Admitting: Internal Medicine

## 2016-05-26 ENCOUNTER — Encounter (INDEPENDENT_AMBULATORY_CARE_PROVIDER_SITE_OTHER): Payer: Self-pay

## 2016-05-26 ENCOUNTER — Encounter: Payer: Self-pay | Admitting: Internal Medicine

## 2016-05-26 VITALS — BP 142/60 | HR 75 | Ht 64.0 in | Wt 144.4 lb

## 2016-05-26 DIAGNOSIS — I456 Pre-excitation syndrome: Secondary | ICD-10-CM | POA: Diagnosis not present

## 2016-05-26 MED ORDER — FLECAINIDE ACETATE 100 MG PO TABS: ORAL_TABLET | ORAL | 3 refills | Status: DC

## 2016-05-26 MED ORDER — METOPROLOL TARTRATE 50 MG PO TABS: ORAL_TABLET | ORAL | 3 refills | Status: DC

## 2016-05-26 NOTE — Progress Notes (Signed)
HPI Mrs. Herington returns today for followup after a 5 year absence from our arrhythmia clinic. She is a pleasant 50 yo woman from [place] with WPW syndrome. When I initially saw her she was found to have WPW on a screening ECG but denied symptoms. Over the past year, she has developed palpitations. She feels her heart racing fast and this may last for over an hour. She has not had syncope. She gets arm pain and mild chest pressure when her heart races but not at other times.   No Known Allergies   Current Outpatient Prescriptions  Medication Sig Dispense Refill  . metFORMIN (GLUCOPHAGE) 1000 MG tablet Take 1 tablet (1,000 mg total) by mouth 2 (two) times daily with a meal. 180 tablet 1   No current facility-administered medications for this visit.      Past Medical History:  Diagnosis Date  . Abdominal ultrasound, abnormal    normal  . Abnormal abdominal CT scan 01/20/2005   and pelvic CT, normal   . Depressed   . Diabetes mellitus without complication ([place])   . Fatty liver   . History of barium enema    normal  . History of pelvic ultrasound 12/06   normal   . Somatic complaints, multiple     ROS:   All systems reviewed and negative except as noted in the HPI.   Past Surgical History:  Procedure Laterality Date  . APPENDECTOMY    . caesarean section     x2  . CHOLECYSTECTOMY  2009     Family History  Problem Relation Age of Onset  . Diabetes Mother   . Hypertension Mother   . Throat cancer Father     non-smoker     Social History   Social History  . Marital status: Married    Spouse name: N/A  . Number of children: 3  . Years of education: N/A   Occupational History  . Not on file.   Social History Main Topics  . Smoking status: Never Smoker  . Smokeless tobacco: Never Used  . Alcohol use No  . Drug use: No  . Sexual activity: Not Currently   Other Topics Concern  . Not on file   Social History Narrative   Originally from [place]. 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      BP (!) 142/60   Pulse 75   Ht 5\' 4"  (1.626 m)   Wt 144 lb 6.4 oz (65.5 kg)   LMP 12/06/2015   BMI 24.79 kg/m   Physical Exam:  Well appearing 50 yo woman, dressed in full religous attire, NAD HEENT: Unremarkable Neck:  6 cm JVD, no thyromegally Lymphatics:  No adenopathy Back:  No CVA tenderness Lungs:  Clear with no wheezes HEART:  Regular rate rhythm, no murmurs, no rubs, no clicks Abd:  soft, positive bowel sounds, no organomegally, no rebound, no guarding Ext:  2 plus pulses, no edema, no cyanosis, no clubbing Skin:  No rashes no nodules Neuro:  CN II through XII intact, motor grossly intact  EKG - reviewed. NSR with manifest ventricular pre-excitation with a right anterolateral AP  Assess/Plan: 1. WPW syndrome - we discussed the various treatment options including catheter ablation, watchful waiting and flecainide/beta blocker combination. We also discussed pill in pocket and daily use. The benefits and limitations of all of the above were reviewed and she  would like to try beta blocker/flecainide as pill in the pocket. I would like to see her back in 6 months for followup. 2. HTN - her blood pressure is minimally elevated. Will follow.  Mikle Bosworth.D.

## 2016-05-26 NOTE — Patient Instructions (Signed)
Medication Instructions:  Your physician has recommended you make the following change in your medication:  1) Start Metoprolol mg ---take one by mouth as needed for fast heart rate 2) Start Flecainide 100mg  ---take one by mouth as needed for fast heart rate  May repeat dosing of both 1 time in an hour if heart rate still elevated    Labwork: None ordered   Testing/Procedures: None ordered   Follow-Up: Your physician wants you to follow-up in: 6 months with Dr Knox Saliva will receive a reminder letter in the mail two months in advance. If you don't receive a letter, please call our office to schedule the follow-up appointment.   Any Other Special Instructions Will Be Listed Below (If Applicable).     If you need a refill on your cardiac medications before your next appointment, please call your pharmacy.

## 2016-06-09 NOTE — Progress Notes (Signed)
Subjective:     Patient ID: [name], female   DOB: 02-May-1966, 50 y.o.   MRN: [id number]  HPI [name] is a 50yo female presenting today for breast pain. --Denies pain in her chest, stating pain is more superficial and in the breast tissue itself --Denies radiation, diaphoresis, shortness of breath --Has had pain for the last several years. Constant. --Has not used medications --Worse with movement --Also of note, she has occasional right flank pain every few months. Denies increase frequency or urgency, but does not some mild dysuria. Denies fever, chills, nausea, vomiting. -Last mammogram 2014   -History of Wolff-Parkinson-White syndrome. Currently following with cardiology. Recently prescribed flecainide-beta blocker combination to use as needed. - Nonsmoker  Review of Systems Per HPI. Other systems negative.    Objective:   Physical Exam  Constitutional: She appears well-developed and well-nourished. No distress.  Cardiovascular: Normal rate and regular rhythm.   No murmur heard. Pulmonary/Chest: Effort normal. No respiratory distress. She has no wheezes.  No masses palpable. Reports tenderness of left breast in right upper quadrant. No erythema noted. No nipple discharge noted. Breasts symmetric.  Abdominal: Soft. She exhibits no distension. There is no tenderness.  No CVA tenderness noted  Musculoskeletal: She exhibits no edema.      Assessment and Plan:     1. Breast pain, left - Unlikely from WPW given reproducible with palpation of superficial left breast - Will obtain mammogram - Recommend NSAIDs for pain  2. Left flank pain - Suspect musculoskeletal - Urinalysis negative for infection or hematuria - History of very mildly elevated LFTs. Will repeat CMP to monitor liver function given location of pain - Avoid Tylenol for pain pending results of CMP.

## 2016-06-10 ENCOUNTER — Ambulatory Visit (INDEPENDENT_AMBULATORY_CARE_PROVIDER_SITE_OTHER): Payer: Medicaid Other | Admitting: Family Medicine

## 2016-06-10 DIAGNOSIS — N644 Mastodynia: Secondary | ICD-10-CM | POA: Diagnosis not present

## 2016-06-10 DIAGNOSIS — R109 Unspecified abdominal pain: Secondary | ICD-10-CM | POA: Diagnosis not present

## 2016-06-10 LAB — COMPLETE METABOLIC PANEL WITH GFR
ALT: 24 U/L (ref 6–29)
AST: 21 U/L (ref 10–35)
Albumin: 4.6 g/dL (ref 3.6–5.1)
Alkaline Phosphatase: 86 U/L (ref 33–115)
BUN: 14 mg/dL (ref 7–25)
CHLORIDE: 100 mmol/L (ref 98–110)
CO2: 24 mmol/L (ref 20–31)
Calcium: 9.7 mg/dL (ref 8.6–10.2)
Creat: 0.71 mg/dL (ref 0.50–1.10)
GLUCOSE: 156 mg/dL — AB (ref 65–99)
POTASSIUM: 4.1 mmol/L (ref 3.5–5.3)
Sodium: 135 mmol/L (ref 135–146)
Total Bilirubin: 0.4 mg/dL (ref 0.2–1.2)
Total Protein: 7.8 g/dL (ref 6.1–8.1)

## 2016-06-10 LAB — POCT URINALYSIS DIPSTICK
BILIRUBIN UA: NEGATIVE
Blood, UA: NEGATIVE
Glucose, UA: NEGATIVE
LEUKOCYTES UA: NEGATIVE
NITRITE UA: NEGATIVE
Spec Grav, UA: 1.025
Urobilinogen, UA: 0.2
pH, UA: 6

## 2016-06-10 NOTE — Patient Instructions (Addendum)
Thank you so much for coming to visit today! We will schedule your mammogram. If this is abnormal, they will likely do a breast ultrasound the same day. Your urinalysis was normal, which helps rule out kidney stones and urinary tract infections. I suspect your pain is musculoskeletal. I would like to check some blood work. If the blood work is normal, your pain is most likely musculoskeletal. You may take Ibuprofen or Aleve for the pain and try heat. Please avoid Tylenol until we get your tests back.  Thanks again! Dr. Gerlean Ren

## 2016-06-15 ENCOUNTER — Encounter: Payer: Self-pay | Admitting: Family Medicine

## 2016-06-17 ENCOUNTER — Other Ambulatory Visit: Payer: Self-pay | Admitting: Family Medicine

## 2016-06-17 ENCOUNTER — Other Ambulatory Visit: Payer: Self-pay | Admitting: Obstetrics and Gynecology

## 2016-06-20 ENCOUNTER — Ambulatory Visit
Admission: RE | Admit: 2016-06-20 | Discharge: 2016-06-20 | Disposition: A | Discharge status: Home / Self Care | Payer: Medicaid Other | Source: Ambulatory Visit | Attending: Family Medicine | Admitting: Family Medicine

## 2016-06-20 DIAGNOSIS — N644 Mastodynia: Secondary | ICD-10-CM

## 2016-07-15 ENCOUNTER — Ambulatory Visit (HOSPITAL_COMMUNITY)
Admission: EM | Admit: 2016-07-15 | Discharge: 2016-07-15 | Disposition: A | Discharge status: Home / Self Care | Payer: Medicaid Other | Attending: Family Medicine | Admitting: Family Medicine

## 2016-07-15 ENCOUNTER — Encounter (HOSPITAL_COMMUNITY): Payer: Self-pay | Admitting: *Deleted

## 2016-07-15 DIAGNOSIS — M170 Bilateral primary osteoarthritis of knee: Secondary | ICD-10-CM

## 2016-07-15 DIAGNOSIS — R7309 Other abnormal glucose: Secondary | ICD-10-CM | POA: Diagnosis not present

## 2016-07-15 LAB — POCT I-STAT, CHEM 8
BUN: 11 mg/dL (ref 6–20)
CALCIUM ION: 1.21 mmol/L (ref 1.15–1.40)
CHLORIDE: 105 mmol/L (ref 101–111)
CREATININE: 0.6 mg/dL (ref 0.44–1.00)
GLUCOSE: 138 mg/dL — AB (ref 65–99)
HCT: 34 % — ABNORMAL LOW (ref 36.0–46.0)
Hemoglobin: 11.6 g/dL — ABNORMAL LOW (ref 12.0–15.0)
Potassium: 3.9 mmol/L (ref 3.5–5.1)
SODIUM: 141 mmol/L (ref 135–145)
TCO2: 24 mmol/L (ref 0–100)

## 2016-07-15 MED ORDER — DICLOFENAC SODIUM 1 % TD GEL: 4.0000 g | Freq: Four times a day (QID) | TRANSDERMAL | 3 refills | Status: DC

## 2016-07-15 NOTE — ED Triage Notes (Signed)
Pt   And  Family  Report patient got dizzy  Today       And   Was  Concerned  Over  Her     Blood   Sugar         At  This  Time   She  Is  Sitting  Upright  On  The  Exam table   Speaking     In  Complete  sentances      Pt   Reports   Taking  Tylenol  For  The  Symptoms

## 2016-07-15 NOTE — ED Provider Notes (Signed)
[facility]    CSN: [id number] Arrival date & time: 07/15/16  [id number]  First Provider Contact:  First MD Initiated Contact with Patient 07/15/16 2152        History   Chief Complaint No chief complaint on file.   HPI [name] is a 50 y.o. female.    Diabetes  This is a chronic problem. The current episode started 6 to 12 hours ago. The problem has not changed since onset.Associated symptoms include headaches. Pertinent negatives include no chest pain, no abdominal pain and no shortness of breath. Associated symptoms comments: And dizziness since 2pm today with bs around 160.Marland Kitchen    Past Medical History:  Diagnosis Date  . Abdominal ultrasound, abnormal    normal  . Abnormal abdominal CT scan 01/20/2005   and pelvic CT, normal   . Depressed   . Diabetes mellitus without complication ([place])   . Fatty liver   . History of barium enema    normal  . History of pelvic ultrasound 12/06   normal   . Somatic complaints, multiple     Patient Active Problem List   Diagnosis Date Noted  . Allergic dermatitis 09/17/2015  . Lactose intolerance 08/13/2015  . Abdominal pain, epigastric 06/11/2015  . Cervical strain 04/07/2015  . Left genital labial abscess 06/05/2014  . Arthralgia 05/05/2014  . Chronic headaches 01/06/2014  . Sinusitis, chronic 09/30/2013  . Amenorrhea 03/29/2013  . Diabetes mellitus ([place]) 01/17/2013  . 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 APPENDECTOMY    . caesarean section     x2  . CHOLECYSTECTOMY  2009    OB History    Gravida Para Term Preterm AB Living   4 3 3   1 3    SAB TAB Ectopic Multiple Live Births       1           Home Medications    Prior to Admission medications   Medication Sig Start Date End Date Taking? Authorizing Provider  flecainide (TAMBOCOR) 100 MG tablet Take 1 tablet by mouth as needed for fast heart rates 05/26/16   Evans Lance, MD  metFORMIN (GLUCOPHAGE) 1000 MG tablet Take 1 tablet (1,000 mg total) by mouth 2 (two) times daily with a meal. 05/02/16   Lorna Few, DO  metoprolol (LOPRESSOR) 50 MG tablet Take 1 tablet by mouth as needed for fast heart rates 05/26/16   Evans Lance, MD    Family History Family History  Problem Relation Age of Onset  . Diabetes Mother   . Hypertension Mother   . Throat cancer Father     non-smoker    Social History Social History  Substance Use Topics  . Smoking status: Never Smoker  . Smokeless tobacco: Never Used  . Alcohol use No     Allergies   Review of patient's allergies indicates no known allergies.   Review of Systems Review of Systems  Constitutional: Negative.  Negative for appetite change and fever.  Respiratory: Negative for shortness of breath.   Cardiovascular: Negative for chest pain.  Gastrointestinal: Negative.  Negative for abdominal pain.  Musculoskeletal: Positive for arthralgias. Negative for joint swelling.  Skin: Negative.   Neurological: Positive for headaches.  All other systems reviewed and are negative.    Physical Exam Triage Vital Signs ED Triage Vitals [07/15/16 2058]  Enc Vitals Group     BP 124/66  Pulse Rate 73     Resp      Temp 97.8 F (36.6 C)     Temp Source Oral     SpO2 100 %     Weight      Height      Head Circumference      Peak Flow      Pain Score      Pain Loc      Pain Edu?      Excl. in [place]?    No data found.   Updated Vital Signs BP 124/66 (BP Location: Left Arm)   Pulse 73   Temp 97.8 F (36.6 C) (Oral)   SpO2 100%   Visual Acuity Right Eye Distance:   Left Eye Distance:   Bilateral Distance:    Right Eye Near:   Left Eye Near:    Bilateral Near:     Physical Exam  Constitutional: She is oriented to person, place, and time. She appears well-developed and well-nourished. No distress.  Abdominal: Soft. Bowel sounds are normal. There is no tenderness.  Musculoskeletal:  She exhibits tenderness.       Legs: Lymphadenopathy:    She has no cervical adenopathy.  Neurological: She is alert and oriented to person, place, and time.  Skin: Skin is warm and dry.  Nursing note and vitals reviewed.    UC Treatments / Results  Labs (all labs ordered are listed, but only abnormal results are displayed) Labs Reviewed - No data to display  EKG  EKG Interpretation None       Radiology No results found.  Procedures Procedures (including critical care time)  Medications Ordered in UC Medications - No data to display   Initial Impression / Assessment and Plan / UC Course  I have reviewed the triage vital signs and the nursing notes.  Pertinent labs & imaging results that were available during my care of the patient were reviewed by me and considered in my medical decision making (see chart for details).  Clinical Course      Final Clinical Impressions(s) / UC Diagnoses   Final diagnoses:  None    New Prescriptions New Prescriptions   No medications on file     Billy Fischer, MD 07/31/16 1145

## 2016-07-15 NOTE — Discharge Instructions (Signed)
Continue your diabetes medicine, see orthopedist if further knee problems.

## 2016-07-19 ENCOUNTER — Encounter: Payer: Medicaid Other | Admitting: Internal Medicine

## 2016-10-13 ENCOUNTER — Encounter: Payer: Self-pay | Admitting: Family Medicine

## 2016-10-13 ENCOUNTER — Ambulatory Visit (INDEPENDENT_AMBULATORY_CARE_PROVIDER_SITE_OTHER): Payer: Medicaid Other | Admitting: Family Medicine

## 2016-10-13 VITALS — BP 136/78 | HR 85 | Temp 98.3°F | Ht 64.0 in | Wt 145.6 lb

## 2016-10-13 DIAGNOSIS — R1011 Right upper quadrant pain: Secondary | ICD-10-CM

## 2016-10-13 DIAGNOSIS — E119 Type 2 diabetes mellitus without complications: Secondary | ICD-10-CM

## 2016-10-13 DIAGNOSIS — R197 Diarrhea, unspecified: Secondary | ICD-10-CM

## 2016-10-13 LAB — COMPLETE METABOLIC PANEL WITH GFR
ALT: 34 U/L — ABNORMAL HIGH (ref 6–29)
AST: 25 U/L (ref 10–35)
Albumin: 4.5 g/dL (ref 3.6–5.1)
Alkaline Phosphatase: 63 U/L (ref 33–115)
BUN: 11 mg/dL (ref 7–25)
CALCIUM: 9.6 mg/dL (ref 8.6–10.2)
CHLORIDE: 102 mmol/L (ref 98–110)
CO2: 28 mmol/L (ref 20–31)
Creat: 0.69 mg/dL (ref 0.50–1.10)
Glucose, Bld: 157 mg/dL — ABNORMAL HIGH (ref 65–99)
POTASSIUM: 3.8 mmol/L (ref 3.5–5.3)
Sodium: 139 mmol/L (ref 135–146)
Total Bilirubin: 0.4 mg/dL (ref 0.2–1.2)
Total Protein: 8 g/dL (ref 6.1–8.1)

## 2016-10-13 LAB — CBC
HCT: 36.5 % (ref 35.0–45.0)
HEMOGLOBIN: 12.2 g/dL (ref 11.7–15.5)
MCH: 28.9 pg (ref 27.0–33.0)
MCHC: 33.4 g/dL (ref 32.0–36.0)
MCV: 86.5 fL (ref 80.0–100.0)
MPV: 9.7 fL (ref 7.5–12.5)
Platelets: 260 10*3/uL (ref 140–400)
RBC: 4.22 MIL/uL (ref 3.80–5.10)
RDW: 13.7 % (ref 11.0–15.0)
WBC: 6.7 10*3/uL (ref 3.8–10.8)

## 2016-10-13 LAB — TSH: TSH: 2.74 m[IU]/L

## 2016-10-13 LAB — POCT GLYCOSYLATED HEMOGLOBIN (HGB A1C): Hemoglobin A1C: 7.3

## 2016-10-13 MED ORDER — BISMUTH SUBSALICYLATE 262 MG PO CHEW: 524.0000 mg | CHEWABLE_TABLET | ORAL | 3 refills | Status: DC | PRN

## 2016-10-13 MED ORDER — LACTOBACILLUS PO TABS: 1.0000 | ORAL_TABLET | Freq: Every day | ORAL | 0 refills | Status: DC

## 2016-10-13 NOTE — Progress Notes (Signed)
DIARRHEA x3 weeks. Yellow in color. Non bloody, non watery. Occurs "immediately after eating and drinking". Associated abdominal cramping. No vomiting. Nausea is present.   Having diarrhea for 3 weeks Progression: constant Stools per day: >4 times a day Does diarrhea wake patient: no Medications tried: none Recent travel: no Sick contacts: no Ingested suspicious foods: no Antibiotics recently: no Immunocompromised: no  Symptoms Vomiting: no Abdominal pain: yes, cramping Weight Loss: no Decreased urine output: no Lightheadedness: no Fever: no Bloody stools: no  Endorses some night sweats.  Has had gall bladder and appendix removed.  Other surgical hx includes previous C-section x3.  ROS see HPI Smoking Status noted   CC, SH/smoking status, and VS noted  Objective: BP 136/78 (BP Location: Left Arm, Patient Position: Sitting, Cuff Size: Normal)   Pulse 85   Temp 98.3 F (36.8 C) (Oral)   Ht 5\' 4"  (1.626 m)   Wt 145 lb 9.6 oz (66 kg)   LMP 12/06/2015 (Exact Date)   SpO2 98%   BMI 24.99 kg/m  Gen: NAD, alert, cooperative, and pleasant. HEENT: NCAT, EOMI, PERRL, sclera anicteric, MMM CV: RRR, no murmur Resp: CTAB, no wheezes, non-labored Abd: S, ND, TTP upper quadrants (R>L), BS present, no guarding or organomegaly Ext: No edema, warm Neuro: Alert and oriented, Speech clear, No gross deficits  Assessment and plan:  Right upper quadrant abdominal pain Patient is here with complaints of 3 weeks of nonbloody non-watery diarrhea. Etiology unknown at this time however differential includes enteritis/colitis versus medication related versus food intolerance (lactose) versus inflammatory. No weight loss since her visit 5 months ago. No recent travel. Physical exam did not yield any symptoms of dehydration. Surgical history includes cholecystectomy, appendectomy, and c-section 3. - Obtain CBC, CMP, TSH - Unlikelihood of infectious colitis (nonbloody/non-watery) makes  Pepto-Bismol an appropriate remedy at this time. - Probiotic provided 1 month. - Recommended follow-up with PCP in 1-2 weeks to discuss symptom improvement, versus possible medication related diarrhea (metformin).  Next: Regrettably I did not notice patient's history of lactose intolerance noted in her PMH, had I seen this I would have gotten a more thorough diet history and discussed avoidance of lactose heavy foods.   Orders Placed This Encounter  Procedures  . COMPLETE METABOLIC PANEL WITH GFR  . TSH  . CBC  . HgB A1c    Meds ordered this encounter  Medications  . bismuth subsalicylate (BISMATROL) 262 MG chewable tablet    Sig: Chew 2 tablets (524 mg total) by mouth as needed.    Dispense:  60 tablet    Refill:  3  . Lactobacillus TABS    Sig: Take 1 tablet by mouth daily.    Dispense:  30 tablet    Refill:  0     Elberta Leatherwood, MD,MS,  PGY3 10/13/2016 7:24 PM

## 2016-10-13 NOTE — Patient Instructions (Signed)
Diarrhea  Treatment - you should: - Take the bismuth subsalicylate chewable tablets as needed, up to 4 times a day, for diarrhea. - Take 1 tablet of the lactobacillus tablets, daily until the bottle is empty.  You should be better in: 5-7 days  Call us or go to the ER if you become dehydrated (no urination or lightheaded) or have severe abdominal pain or bleeding or fever.  Come back to see your primary care provider in one to 2 weeks

## 2016-10-13 NOTE — Assessment & Plan Note (Signed)
Patient is here with complaints of 3 weeks of nonbloody non-watery diarrhea. Etiology unknown at this time however differential includes enteritis/colitis versus medication related versus food intolerance (lactose) versus inflammatory. No weight loss since her visit 5 months ago. No recent travel. Physical exam did not yield any symptoms of dehydration. Surgical history includes cholecystectomy, appendectomy, and c-section 3. - Obtain CBC, CMP, TSH - Unlikelihood of infectious colitis (nonbloody/non-watery) makes Pepto-Bismol an appropriate remedy at this time. - Probiotic provided 1 month. - Recommended follow-up with PCP in 1-2 weeks to discuss symptom improvement, versus possible medication related diarrhea (metformin).  Next: Regrettably I did not notice patient's history of lactose intolerance noted in her PMH, had I seen this I would have gotten a more thorough diet history and discussed avoidance of lactose heavy foods.

## 2016-10-18 ENCOUNTER — Encounter: Payer: Self-pay | Admitting: Family Medicine

## 2016-11-02 ENCOUNTER — Encounter: Payer: Self-pay | Admitting: Family Medicine

## 2016-11-02 ENCOUNTER — Ambulatory Visit (INDEPENDENT_AMBULATORY_CARE_PROVIDER_SITE_OTHER): Payer: Medicaid Other | Admitting: Family Medicine

## 2016-11-02 VITALS — BP 138/76 | HR 80 | Temp 98.2°F | Ht 64.0 in | Wt 148.6 lb

## 2016-11-02 DIAGNOSIS — E739 Lactose intolerance, unspecified: Secondary | ICD-10-CM

## 2016-11-02 LAB — POCT URINALYSIS DIPSTICK
Bilirubin, UA: NEGATIVE
Blood, UA: NEGATIVE
GLUCOSE UA: 100
KETONES UA: NEGATIVE
Leukocytes, UA: NEGATIVE
Nitrite, UA: NEGATIVE
PROTEIN UA: NEGATIVE
SPEC GRAV UA: 1.025
Urobilinogen, UA: 0.2
pH, UA: 6

## 2016-11-02 NOTE — Progress Notes (Signed)
Subjective:     Patient ID: [name], female   DOB: 13-Jun-1966, 51 y.o.   MRN: [id number]  HPI [name] is a 51yo female presenting today to follow up diarrhea.  Initially evaluated on 10/13/2016, where she reported 3 week history of diarrhea described as more than 4 non-bloody, non-watery bowel movements per day. Also noted abdominal cramping and nausea. Denied fevers or vomiting. Reported diarrhea occurred immediately after eating and drinking. History of lactose intolerance, cholecystectomy, appendectomy, and C-section 3 noted. She was instructed to take Pepto-Bismol as needed and to use a probiotic. Instructed to follow-up with PCP in 1-2 weeks.   Today, reports significant improvement in diarrhea. Reports she now only has 2 bowel movements per day. Denies bloody or watery stools. Denies fever or vomiting. Does note some nausea. Continues to note occasional abdominal pain but this only occurs with episodes of diarrhea. Reports she was drinking milk prior to her last office visit however she stopped this 2 weeks ago with subsequent improvement in symptoms. Reports she knows she is lactose intolerant however she thought she might build tolerate some milk since she has not had it in a while.  Also of note, she reports her urine is "hot". Denies dysuria, frequency, urgency.  Review of Systems Per HPI    Objective:   Physical Exam  Constitutional: She appears well-developed and well-nourished. No distress.  HENT:  Head: Normocephalic and atraumatic.  Cardiovascular: Normal rate and regular rhythm.   No murmur heard. Pulmonary/Chest: Effort normal. No respiratory distress. She has no wheezes.  Abdominal: Soft. Bowel sounds are normal. She exhibits no distension.  Diffuse abdominal tenderness. Negative Murphy's. Negative rebound.      Assessment and Plan:     Lactose intolerance Symptoms of abdominal pain and diarrhea improving since discontinuation of milk. Handout concerning  diet appropriate for lactose intolerance given. Given complaint of "hot" urine, will obtain Urinalysis. If no improvement, consider KUB and/or stool studies. Follow-up if no improvement.

## 2016-11-02 NOTE — Patient Instructions (Signed)
Diet for Lactose Intolerance, Adult Introduction Lactose intolerance is when the body is not able to digest lactose, a natural sugar found in milk and milk products. If you are lactose intolerant, you should avoid consuming food and drinks with lactose. What do I need to know about this diet?  Avoid consuming foods and beverages with lactose.  Look for the words "lactose-free" or "lactose-reduced" on food labels. You can have lactose-free foods and may be able to have small amounts of lactose-reduced foods.  Make sure you get enough nutrients in your diet. People on this diet sometimes have trouble getting enough calcium, riboflavin, and vitamin D. Take supplements if directed by your health care provider. Talk to your health care provider about supplements if you are not taking any. Which foods have lactose? Lactose is found in milk and milk products, such as:  Yogurt.  Cheese.  Butter.  Margarine.  Sour cream.  Creamer.  Whipped toppings and nondairy creamers.  Ice cream and other milk-based desserts. Lactose is also found in foods made with milk or milk ingredients. To find out whether a food is made with milk or a milk ingredient, look at the ingredients list. Avoid foods with the statement "May contain milk" and foods that contain:  Butter.  Cream.  Milk.  Milk solids.  Milk powder.  Whey.  Curd.  Caseinate.  Lactose. What are some alternatives to milk and foods made with milk products?  Lactose-free products, such as lactose-free milk.  Almond or rice milk.  Soy products, such as soy yogurt, soy cheese, soy ice cream, soy-based sour cream, and soy-based infant formula.  Nondairy products, such as nondairy creamers and nondairy whipped topping. Note that nondairy products sometimes contain lactose, so it is important to check the ingredients list. Can I have any foods with lactose? Some people with lactose intolerance can safely eat foods that have a  little lactose. Foods with a little lactose have less than 1 g of lactose per serving. Examples of foods with a little lactose are:  Aged cheese (such as Swiss, cheddar, or Parmesan cheese). One serving is about 1-2 oz.  Cream cheese. One serving is about 2 Tbsp.  Ricotta cheese. One serving is about  cup. If you decide to try a food that has lactose:  Eat only one food with lactose in it at a time.  Eat only a small amount of the food.  Stop eating the food if your symptoms return. Some dairy products that are more likely than others to be tolerated include:  Cheese, especially if it is aged.  Cultured dairy products, such as yogurt, buttermilk, cottage cheese, and kefir. The healthy bacteria in these products help digest lactose.  Lactose-hydrolyzed milk. This product contains 40-90% less lactose than milk. Am I getting enough calcium? Calcium is found in many foods with lactose and is important for bone health. The amount of calcium you need depends on your age:  Adults younger than 50 years need 1000 mg of calcium a day.  Adults older than 50 years need 1200 mg of calcium a day. Make sure you get enough calcium by taking a calcium supplement or by eating lactose-free foods that are high in calcium, such as:  Almonds,  cup (95 mg).  Broccoli, cooked, 1 cup (60 mg).  Calcium-fortified breakfast cereals, 1 cup [phone]).  Calcium-fortified rice or almond milk, 1 cup (300 mg).  Calcium-fortified soy milk, 1 cup (300-400 mg).  Canned salmon with edible bones, 3 oz (  180 mg).  Collard greens, cooked,  cup (125 mg).  Edamame, cooked,  cup (125 mg).  Kale, frozen or cooked,  cup (90 mg).  Orange juice with calcium added, 1 cup (300-350 mg).  Sardines with edible bones, 3 oz (325 mg).  Spinach, cooked,  cup (145 mg).  Tofu set with calcium sulfate,  cup (250 mg). This information is not intended to replace advice given to you by your health care provider.  Make sure you discuss any questions you have with your health care provider. Document Released: 05/14/2014 Document Revised: 03/24/2016 Document Reviewed: 12/20/2013  2017 Elsevier

## 2016-11-02 NOTE — Assessment & Plan Note (Addendum)
Symptoms of abdominal pain and diarrhea improving since discontinuation of milk. Handout concerning diet appropriate for lactose intolerance given. Given complaint of "hot" urine, will obtain Urinalysis. If no improvement, consider KUB and/or stool studies. Follow-up if no improvement.

## 2016-11-15 ENCOUNTER — Emergency Department (HOSPITAL_COMMUNITY): Payer: Medicaid Other

## 2016-11-15 ENCOUNTER — Emergency Department (HOSPITAL_COMMUNITY)
Admission: EM | Admit: 2016-11-15 | Discharge: 2016-11-15 | Disposition: A | Discharge status: Home / Self Care | Payer: Medicaid Other | Attending: Emergency Medicine | Admitting: Emergency Medicine

## 2016-11-15 ENCOUNTER — Encounter (HOSPITAL_COMMUNITY): Payer: Self-pay | Admitting: Emergency Medicine

## 2016-11-15 DIAGNOSIS — R079 Chest pain, unspecified: Secondary | ICD-10-CM | POA: Diagnosis not present

## 2016-11-15 DIAGNOSIS — Z79899 Other long term (current) drug therapy: Secondary | ICD-10-CM | POA: Diagnosis not present

## 2016-11-15 DIAGNOSIS — E119 Type 2 diabetes mellitus without complications: Secondary | ICD-10-CM | POA: Diagnosis not present

## 2016-11-15 DIAGNOSIS — Z7984 Long term (current) use of oral hypoglycemic drugs: Secondary | ICD-10-CM | POA: Insufficient documentation

## 2016-11-15 LAB — BASIC METABOLIC PANEL
ANION GAP: 10 (ref 5–15)
BUN: 11 mg/dL (ref 6–20)
CALCIUM: 9.5 mg/dL (ref 8.9–10.3)
CO2: 23 mmol/L (ref 22–32)
Chloride: 105 mmol/L (ref 101–111)
Creatinine, Ser: 0.72 mg/dL (ref 0.44–1.00)
GFR calc Af Amer: >60 mL/min (ref >60)
GFR calc non Af Amer: >60 mL/min (ref >60)
GLUCOSE: 163 mg/dL — AB (ref 65–99)
Potassium: 3.9 mmol/L (ref 3.5–5.1)
Sodium: 138 mmol/L (ref 135–145)

## 2016-11-15 LAB — CBC WITH DIFFERENTIAL/PLATELET
BASOS ABS: 0 10*3/uL (ref 0.0–0.1)
Basophils Relative: 0 %
EOS PCT: 3 %
Eosinophils Absolute: 0.2 10*3/uL (ref 0.0–0.7)
HEMATOCRIT: 36.8 % (ref 36.0–46.0)
Hemoglobin: 12 g/dL (ref 12.0–15.0)
LYMPHS ABS: 2.3 10*3/uL (ref 0.7–4.0)
LYMPHS PCT: 39 %
MCH: 28.4 pg (ref 26.0–34.0)
MCHC: 32.6 g/dL (ref 30.0–36.0)
MCV: 87.2 fL (ref 78.0–100.0)
MONO ABS: 0.6 10*3/uL (ref 0.1–1.0)
MONOS PCT: 10 %
NEUTROS ABS: 2.9 10*3/uL (ref 1.7–7.7)
Neutrophils Relative %: 48 %
Platelets: 246 10*3/uL (ref 150–400)
RBC: 4.22 MIL/uL (ref 3.87–5.11)
RDW: 13.6 % (ref 11.5–15.5)
WBC: 5.9 10*3/uL (ref 4.0–10.5)

## 2016-11-15 LAB — I-STAT TROPONIN, ED
TROPONIN I, POC: 0 ng/mL (ref 0.00–0.08)
Troponin i, poc: 0 ng/mL (ref 0.00–0.08)
Troponin i, poc: 0 ng/mL (ref 0.00–0.08)

## 2016-11-15 MED ORDER — CYCLOBENZAPRINE HCL 10 MG PO TABS: 10.0000 mg | ORAL_TABLET | Freq: Two times a day (BID) | ORAL | 0 refills | Status: DC | PRN

## 2016-11-15 MED ORDER — NITROGLYCERIN 0.4 MG SL SUBL: 0.4000 mg | SUBLINGUAL_TABLET | SUBLINGUAL | Status: DC | PRN

## 2016-11-15 MED ORDER — ASPIRIN 81 MG PO CHEW
324.0000 mg | CHEWABLE_TABLET | Freq: Once | ORAL | Status: AC
  Administered 2016-11-15: 324 mg via ORAL
  Filled 2016-11-15: qty 4

## 2016-11-15 MED ORDER — MORPHINE SULFATE (PF) 4 MG/ML IV SOLN
4.0000 mg | Freq: Once | INTRAVENOUS | Status: DC
  Filled 2016-11-15: qty 1

## 2016-11-15 NOTE — ED Triage Notes (Signed)
Pt c/o left sided cp that began yesterday, went away and returned today.  Pt with hx WPW.

## 2016-11-15 NOTE — ED Notes (Signed)
Pt taken to xray 

## 2016-11-15 NOTE — ED Provider Notes (Signed)
[facility] DEPT Provider Note   CSN: [id number] Arrival date & time: 11/15/16  [id number]     History   Chief Complaint Chief Complaint  Patient presents with  . Chest Pain    HPI [name] is a 51 y.o. female.  Patient with past medical history remarkable for WPW and diabetes presents emergency department with chief complaint of chest pain. She states the symptoms started last night. They have been intermittent. She states that the pain radiates to her left neck and to her left arm. She states that she came to the emergency department this morning when the symptoms worsened. She states that when the symptoms are at their worst, she feels short of breath, and also reports having a cough. She has not taken anything for her symptoms. There are no modifying factors.   The history is provided by the patient. No language interpreter was used.    Past Medical History:  Diagnosis Date  . Abdominal ultrasound, abnormal    normal  . Abnormal abdominal CT scan 01/20/2005   and pelvic CT, normal   . Depressed   . Diabetes mellitus without complication ([place])   . Fatty liver   . History of barium enema    normal  . History of pelvic ultrasound 12/06   normal   . Somatic complaints, multiple     Patient Active Problem List   Diagnosis Date Noted  . Allergic dermatitis 09/17/2015  . Lactose intolerance 08/13/2015  . Abdominal pain, epigastric 06/11/2015  . Cervical strain 04/07/2015  . Left genital labial abscess 06/05/2014  . Arthralgia 05/05/2014  . Chronic headaches 01/06/2014  . Sinusitis, chronic 09/30/2013  . Amenorrhea 03/29/2013  . Right upper quadrant abdominal pain 01/17/2013  . Diabetes mellitus ([place]) 01/17/2013  . 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 APPENDECTOMY    . caesarean section     x2  . CHOLECYSTECTOMY  2009    OB History    Gravida Para Term Preterm AB Living   4 3 3    1 3    SAB TAB Ectopic Multiple Live Births       1           Home Medications    Prior to Admission medications   Medication Sig Start Date End Date Taking? Authorizing Provider  diclofenac sodium (VOLTAREN) 1 % GEL Apply 4 g topically 4 (four) times daily. Prn kneee pain 07/15/16  Yes Billy Fischer, MD  metFORMIN (GLUCOPHAGE) 1000 MG tablet Take 1 tablet (1,000 mg total) by mouth 2 (two) times daily with a meal. 05/02/16  Yes [place] N [place], DO  bismuth subsalicylate (BISMATROL) 262 MG chewable tablet Chew 2 tablets (524 mg total) by mouth as needed. Patient not taking: Reported on 11/15/2016 10/13/16   Elberta Leatherwood, MD  flecainide (TAMBOCOR) 100 MG tablet Take 1 tablet by mouth as needed for fast heart rates Patient not taking: Reported on 11/15/2016 05/26/16   Evans Lance, MD  Lactobacillus TABS Take 1 tablet by mouth daily. Patient not taking: Reported on 11/15/2016 10/13/16   Elberta Leatherwood, MD  metoprolol (LOPRESSOR) 50 MG tablet Take 1 tablet by mouth as needed for fast heart rates Patient not taking: Reported on 11/15/2016 05/26/16   Evans Lance, MD    Family History Family History  Problem Relation Age of Onset  . Throat cancer Father     non-smoker  . Diabetes  Mother   . Hypertension Mother     Social History Social History  Substance Use Topics  . Smoking status: Never Smoker  . Smokeless tobacco: Never Used  . Alcohol use No     Allergies   Patient has no known allergies.   Review of Systems Review of Systems  Respiratory: Positive for shortness of breath.   Cardiovascular: Positive for chest pain.  All other systems reviewed and are negative.    Physical Exam Updated Vital Signs BP 147/80   Pulse 66   Temp 98 F (36.7 C) (Oral)   Resp 20   Wt 65.3 kg   LMP 12/17/2015 (Approximate) Comment: irregular  SpO2 100%   BMI 24.72 kg/m   Physical Exam  Constitutional: She is oriented to person, place, and time. She appears well-developed and  well-nourished.  HENT:  Head: Normocephalic and atraumatic.  Eyes: Conjunctivae and EOM are normal. Pupils are equal, round, and reactive to light.  Neck: Normal range of motion. Neck supple.  Cardiovascular: Normal rate and regular rhythm.  Exam reveals no gallop and no friction rub.   No murmur heard. Pulmonary/Chest: Effort normal and breath sounds normal. No respiratory distress. She has no wheezes. She has no rales. She exhibits no tenderness.  Abdominal: Soft. Bowel sounds are normal. She exhibits no distension and no mass. There is no tenderness. There is no rebound and no guarding.  Musculoskeletal: Normal range of motion. She exhibits no edema or tenderness.  Neurological: She is alert and oriented to person, place, and time.  Skin: Skin is warm and dry.  Psychiatric: She has a normal mood and affect. Her behavior is normal. Judgment and thought content normal.  Nursing note and vitals reviewed.    ED Treatments / Results  Labs (all labs ordered are listed, but only abnormal results are displayed) Labs Reviewed  BASIC METABOLIC PANEL - Abnormal; Notable for the following:       Result Value   Glucose, Bld 163 (*)    All other components within normal limits  CBC WITH DIFFERENTIAL/PLATELET  Randolm Idol, ED  I-STAT TROPOININ, ED    EKG  EKG Interpretation  Date/Time:  Tuesday November 15 2016 09:01:13 EST Ventricular Rate:  68 PR Interval:  104 QRS Duration: 94 QT Interval:  406 QTC Calculation: 431 R Axis:   12 Text Interpretation:  Sinus rhythm with short PR Otherwise normal ECG short PR c/w wolf parkinson white- similar to prior EKGs Confirmed by [facility]  MD, MARTHA [phone]) on 11/15/2016 10:32:16 AM       Radiology Dg Chest 2 View  Result Date: 11/15/2016 CLINICAL DATA:  Chest pain starting last night EXAM: CHEST  2 VIEW COMPARISON:  07/18/2013 FINDINGS: The heart size and mediastinal contours are within normal limits. Both lungs are clear. The visualized  skeletal structures are unremarkable. IMPRESSION: No active cardiopulmonary disease. Electronically Signed   By: Lahoma Crocker M.D.   On: 11/15/2016 10:04    Procedures Procedures (including critical care time)  Medications Ordered in ED Medications  aspirin chewable tablet 324 mg (not administered)  nitroGLYCERIN (NITROSTAT) SL tablet 0.4 mg (not administered)  morphine 4 MG/ML injection 4 mg (not administered)     Initial Impression / Assessment and Plan / ED Course  I have reviewed the triage vital signs and the nursing notes.  Pertinent labs & imaging results that were available during my care of the patient were reviewed by me and considered in my medical decision making (see chart  for details).  Clinical Course     Patient with history of WPW, presents with chest pain. Will check labs, chest x-ray, and treat pain. Will give aspirin. Will reassess.  Low risk for PE per Wells criteria.  Heart score is 2.  Low risk for ACS. Delta troponin is negative, I verified this myself with the lab, but it did not cross in epic due to power outage.  Patient reports that her pain is improved. She does have some musculoskeletal neck and upper back pain. I will give her a prescription of Flexeril for this. Have encouraged patient follow-up with her cardiologist. She understands agrees the plan. She is stable appearing. Close return precautions given. Final Clinical Impressions(s) / ED Diagnoses   Final diagnoses:  Nonspecific chest pain    New Prescriptions New Prescriptions   CYCLOBENZAPRINE (FLEXERIL) 10 MG TABLET    Take 1 tablet (10 mg total) by mouth 2 (two) times daily as needed for muscle spasms.     Montine Circle, PA-C 11/15/16 [place], MD 11/15/16 [phone]

## 2016-11-15 NOTE — ED Notes (Signed)
Patient is resting comfortably. 

## 2016-11-28 ENCOUNTER — Encounter (HOSPITAL_COMMUNITY): Payer: Self-pay | Admitting: *Deleted

## 2016-11-28 ENCOUNTER — Ambulatory Visit (HOSPITAL_COMMUNITY)
Admission: EM | Admit: 2016-11-28 | Discharge: 2016-11-28 | Disposition: A | Discharge status: Home / Self Care | Payer: Medicaid Other | Attending: Family Medicine | Admitting: Family Medicine

## 2016-11-28 DIAGNOSIS — K1121 Acute sialoadenitis: Secondary | ICD-10-CM

## 2016-11-28 MED ORDER — AMOXICILLIN-POT CLAVULANATE 875-125 MG PO TABS: 1.0000 | ORAL_TABLET | Freq: Two times a day (BID) | ORAL | 0 refills | Status: DC

## 2016-11-28 MED ORDER — HYDROCODONE-ACETAMINOPHEN 5-325 MG PO TABS: 1.0000 | ORAL_TABLET | Freq: Four times a day (QID) | ORAL | 0 refills | Status: DC | PRN

## 2016-11-28 NOTE — ED Triage Notes (Signed)
Pin  r   Side      Of    Face   With   Some     Swelling   denys   Any  Injury   She  Is  Alert  And  Oriented

## 2016-11-28 NOTE — ED Provider Notes (Signed)
[place]    CSN: [id number] Arrival date & time: 11/28/16  1434     History   Chief Complaint Chief Complaint  Patient presents with  . Facial Pain    HPI [name] is a 51 y.o. female.   Is a 51 year old Arabic woman who presents to the [facility] urgent care with right sided facial swelling and discomfort. The problem began last night with swelling and pain around the TMJ and in the right cheek. The pain radiates into her right ear. She's not had this problem before and she denies any dental problem.      Past Medical History:  Diagnosis Date  . Abdominal ultrasound, abnormal    normal  . Abnormal abdominal CT scan 01/20/2005   and pelvic CT, normal   . Depressed   . Diabetes mellitus without complication ([place])   . Fatty liver   . History of barium enema    normal  . History of pelvic ultrasound 12/06   normal   . Somatic complaints, multiple     Patient Active Problem List   Diagnosis Date Noted  . Allergic dermatitis 09/17/2015  . Lactose intolerance 08/13/2015  . Abdominal pain, epigastric 06/11/2015  . Cervical strain 04/07/2015  . Left genital labial abscess 06/05/2014  . Arthralgia 05/05/2014  . Chronic headaches 01/06/2014  . Sinusitis, chronic 09/30/2013  . Amenorrhea 03/29/2013  . Right upper quadrant abdominal pain 01/17/2013  . Diabetes mellitus ([place]) 01/17/2013  . 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 APPENDECTOMY    . caesarean section     x2  . CHOLECYSTECTOMY  2009    OB History    Gravida Para Term Preterm AB Living   4 3 3   1 3    SAB TAB Ectopic Multiple Live Births       1           Home Medications    Prior to Admission medications   Medication Sig Start Date End Date Taking? Authorizing Provider  amoxicillin-clavulanate (AUGMENTIN) 875-125 MG tablet Take 1 tablet by mouth every 12 (twelve) hours. 11/28/16    Robyn Haber, MD  cyclobenzaprine (FLEXERIL) 10 MG tablet Take 1 tablet (10 mg total) by mouth 2 (two) times daily as needed for muscle spasms. 11/15/16   Montine Circle, PA-C  diclofenac sodium (VOLTAREN) 1 % GEL Apply 4 g topically 4 (four) times daily. Prn kneee pain 07/15/16   Billy Fischer, MD  HYDROcodone-acetaminophen (NORCO) 5-325 MG tablet Take 1 tablet by mouth every 6 (six) hours as needed for moderate pain. 11/28/16   Robyn Haber, MD  metFORMIN (GLUCOPHAGE) 1000 MG tablet Take 1 tablet (1,000 mg total) by mouth 2 (two) times daily with a meal. 05/02/16   Lorna Few, DO    Family History Family History  Problem Relation Age of Onset  . Throat cancer Father     non-smoker  . Diabetes Mother   . Hypertension Mother     Social History Social History  Substance Use Topics  . Smoking status: Never Smoker  . Smokeless tobacco: Never Used  . Alcohol use No     Allergies   Patient has no known allergies.   Review of Systems Review of Systems  Constitutional: Negative.   HENT: Positive for ear pain and facial swelling.   Respiratory: Negative.   Genitourinary: Negative.   Neurological: Negative.  Physical Exam Triage Vital Signs ED Triage Vitals  Enc Vitals Group     BP 11/28/16 1517 167/70     Pulse Rate 11/28/16 1517 77     Resp 11/28/16 1517 18     Temp 11/28/16 1517 98.2 F (36.8 C)     Temp Source 11/28/16 1517 Oral     SpO2 11/28/16 1517 100 %     Weight --      Height --      Head Circumference --      Peak Flow --      Pain Score 11/28/16 1520 7     Pain Loc --      Pain Edu? --      Excl. in [place]? --    No data found.   Updated Vital Signs BP 167/70 (BP Location: Right Arm)   Pulse 77   Temp 98.2 F (36.8 C) (Oral)   Resp 18   SpO2 100%       Physical Exam  Constitutional: She is oriented to person, place, and time. She appears well-developed and well-nourished.  HENT:  Left Ear: External ear normal.  Mouth/Throat:  Oropharynx is clear and moist.  Right pretragal node is swollen and there is mild swelling in the parotid gland on the right. The right ear canal and TM are normal  Eyes: Conjunctivae are normal.  Neck: Normal range of motion. Neck supple.  Pulmonary/Chest: Effort normal.  Musculoskeletal: Normal range of motion.  Neurological: She is alert and oriented to person, place, and time.  Skin: Skin is warm and dry.  Nursing note and vitals reviewed.    UC Treatments / Results  Labs (all labs ordered are listed, but only abnormal results are displayed) Labs Reviewed - No data to display  EKG  EKG Interpretation None       Radiology No results found.  Procedures Procedures (including critical care time)  Medications Ordered in UC Medications - No data to display   Initial Impression / Assessment and Plan / UC Course  I have reviewed the triage vital signs and the nursing notes.  Pertinent labs & imaging results that were available during my care of the patient were reviewed by me and considered in my medical decision making (see chart for details).     Final Clinical Impressions(s) / UC Diagnoses   Final diagnoses:  Acute parotitis    New Prescriptions New Prescriptions   AMOXICILLIN-CLAVULANATE (AUGMENTIN) 875-125 MG TABLET    Take 1 tablet by mouth every 12 (twelve) hours.   HYDROCODONE-ACETAMINOPHEN (NORCO) 5-325 MG TABLET    Take 1 tablet by mouth every 6 (six) hours as needed for moderate pain.     Robyn Haber, MD 11/28/16 1550

## 2016-11-30 ENCOUNTER — Ambulatory Visit: Payer: Medicaid Other | Admitting: Family Medicine

## 2016-12-08 ENCOUNTER — Ambulatory Visit (INDEPENDENT_AMBULATORY_CARE_PROVIDER_SITE_OTHER): Payer: Medicaid Other | Admitting: Student

## 2016-12-08 ENCOUNTER — Encounter: Payer: Self-pay | Admitting: Student

## 2016-12-08 DIAGNOSIS — K0889 Other specified disorders of teeth and supporting structures: Secondary | ICD-10-CM

## 2016-12-08 HISTORY — DX: Other specified disorders of teeth and supporting structures: K08.89

## 2016-12-08 NOTE — Progress Notes (Signed)
   Subjective:    Patient ID: [name], female    DOB: 1965-12-25, 51 y.o.   MRN: [id number]   CC: right sided facial and tooth pain  HPI: 51 y/o F presents for right sided facial pain and tooth pain  Right sided facial pain and tooth pain - started 13 days ago - seen at urgent care and started on Augmentin for parotitis - she had facial swelling at that time which has resolved - her pain started to improve but has not completely resolved - she reports pain of her right lower molar with chewing - pain extends to her jaw, cheek and right ear - she did not taker her temperature at home but felt flushed prior to starting the antibiotics but was afebrile in urgent care  - she has not had rash  Smoking status reviewed  Review of Systems  Per HPI else deneis chest pain, shortness of breath, abdominal pain, N/V/D    Objective:  BP 124/76   Pulse 78   Temp 97.8 F (36.6 C) (Oral)   Wt 148 lb (67.1 kg)   LMP 12/16/2015   SpO2 99%   BMI 25.40 kg/m  Vitals and nursing note reviewed  General: NAD HEENT: poor dentition with tenderness to palpation over right lower 2nd and 3rd molars most over the 3rd molar no evidence of abscess, else teeth non tender, no facial tenderness to palpation, no swelling, normal right TM, no rashes or skin changes Cardiac: RRR Respiratory: CTAB, normal effort. Skin: warm and dry, no rashes noted Neuro: alert and oriented, no focal deficits   Assessment & Plan:    Pain, dental Pain likely secondary to dental infection. No evidence of abscess on exam and patient is afebrile - will give medicaid dentist list - motrin/tylenol for pain - return precautions disucssed    Phi Avans A. Lincoln Brigham MD, [facility] Resident PGY-3 Pager [phone]

## 2016-12-08 NOTE — Patient Instructions (Signed)
You were diagnosed with a tooth infection Please use tylenol and motrin as needed for pain Please schedule a dental visit- you were given a list

## 2016-12-08 NOTE — Assessment & Plan Note (Signed)
Pain likely secondary to dental infection. No evidence of abscess on exam and patient is afebrile - will give medicaid dentist list - motrin/tylenol for pain - return precautions disucssed

## 2017-04-04 ENCOUNTER — Encounter: Payer: Self-pay | Admitting: Student

## 2017-04-04 ENCOUNTER — Ambulatory Visit (INDEPENDENT_AMBULATORY_CARE_PROVIDER_SITE_OTHER): Payer: Medicaid Other | Admitting: Student

## 2017-04-04 VITALS — BP 132/64 | HR 74 | Temp 98.3°F | Ht 64.0 in | Wt 144.0 lb

## 2017-04-04 DIAGNOSIS — L309 Dermatitis, unspecified: Secondary | ICD-10-CM

## 2017-04-04 DIAGNOSIS — R5383 Other fatigue: Secondary | ICD-10-CM | POA: Insufficient documentation

## 2017-04-04 DIAGNOSIS — E119 Type 2 diabetes mellitus without complications: Secondary | ICD-10-CM | POA: Diagnosis present

## 2017-04-04 DIAGNOSIS — R21 Rash and other nonspecific skin eruption: Secondary | ICD-10-CM | POA: Diagnosis not present

## 2017-04-04 HISTORY — DX: Dermatitis, unspecified: L30.9

## 2017-04-04 HISTORY — DX: Other fatigue: R53.83

## 2017-04-04 LAB — POCT GLYCOSYLATED HEMOGLOBIN (HGB A1C): HEMOGLOBIN A1C: 7.8

## 2017-04-04 LAB — GLUCOSE, POCT (MANUAL RESULT ENTRY): POC GLUCOSE: 157 mg/dL — AB (ref 70–99)

## 2017-04-04 MED ORDER — HYDROCORTISONE 1 % EX LOTN: 1.0000 "application " | TOPICAL_LOTION | Freq: Two times a day (BID) | CUTANEOUS | 0 refills | Status: DC

## 2017-04-04 NOTE — Assessment & Plan Note (Signed)
Chronic fatigue - will obtain B12, CBC, CMP, TSH and follow

## 2017-04-04 NOTE — Progress Notes (Addendum)
   Subjective:    Patient ID: [name], female    DOB: [birth year], 51 y.o.   MRN: [id number]   CC: DM check, rash on left ankle  HPI: 51 y/o F with PMH of T2DM presents for DM check and ras on left ankle  T2DM - she reports elevated CBGs to high 100s on waking then shortly after hypoglycemia such that she feels weak and shaky - this has been ongoing for some time - she typically wakes at 5 AM, eats at 10 AM then had lunch then no dinner - she takes metformin 1000 BID - CBG on arriving to clinic is 157, A1c is 7.8  Fatigue - reports daily fatigue which has been ongoing - no weakness  Rash on left ankle - noted for several days - very itchy - she moisturizes daily with no relief - no one else in the house has a similar rash  Smoking status reviewed  Review of Systems  Per HPI, else denies recent illness, fever, chest pain, shortness of breath,    Objective:  BP 132/64   Pulse 74   Temp 98.3 F (36.8 C) (Oral)   Ht 5\' 4"  (1.626 m)   Wt 144 lb (65.3 kg)   SpO2 99%   BMI 24.72 kg/m  Vitals and nursing note reviewed  General: NAD Cardiac: RRR,  Respiratory: CTAB, normal effort Extremities: no edema or cyanosis. WWP. Skin: dry scaling papular rash on left ankle, otherwise diffuse dry skin Neuro: alert and oriented, no focal deficits   Assessment & Plan:    Diabetes mellitus Fluctuating CBGs in the setting of what appears to be inadequate and irregular food intake. When discussed a diabetic diet, she did not seem to know what foods were appropriate.  - Diabetic diet and regular eating regimen discussed - she was given Dr Jenne Campus information - will follow with PCP - will not change metformin at this time as her A1c increased to 7.8   Rash and nonspecific skin eruption Rash on ankle appears to be very dry and very similar in appearance to eczema.  - hydocortisone  - hydration precautions discussed    Mays Paino A. Lincoln Brigham MD, [facility] Resident  PGY-3 Pager [phone]

## 2017-04-04 NOTE — Patient Instructions (Signed)
Follow up with PCP in 2 weeks [place] 3 times per day [place] breakfast, lunch and dinner Eat lean proteins with fruits and vegetables Make an appointment for the nutritionist Call the office with questions or concerns

## 2017-04-04 NOTE — Assessment & Plan Note (Addendum)
Fluctuating CBGs in the setting of what appears to be inadequate and irregular food intake. When discussed a diabetic diet, she did not seem to know what foods were appropriate.  - Diabetic diet and regular eating regimen discussed - she was given Dr Jenne Campus information - will follow with PCP - will not change metformin at this time as her A1c increased to 7.8  - B12 lab given prolonged metformin use

## 2017-04-04 NOTE — Assessment & Plan Note (Signed)
Rash on ankle appears to be very dry and very similar in appearance to eczema.  - hydocortisone  - hydration precautions discussed

## 2017-04-05 LAB — COMPREHENSIVE METABOLIC PANEL
A/G RATIO: 1.2 (ref 1.2–2.2)
ALT: 38 IU/L — AB (ref 0–32)
AST: 29 IU/L (ref 0–40)
Albumin: 4.2 g/dL (ref 3.5–5.5)
Alkaline Phosphatase: 98 IU/L (ref 39–117)
BUN/Creatinine Ratio: 15 (ref 9–23)
BUN: 12 mg/dL (ref 6–24)
CALCIUM: 9.4 mg/dL (ref 8.7–10.2)
CHLORIDE: 103 mmol/L (ref 96–106)
CO2: 25 mmol/L (ref 18–29)
Creatinine, Ser: 0.78 mg/dL (ref 0.57–1.00)
GFR calc Af Amer: 103 mL/min/{1.73_m2} (ref >59)
GFR, EST NON AFRICAN AMERICAN: 90 mL/min/{1.73_m2} (ref >59)
GLOBULIN, TOTAL: 3.4 g/dL (ref 1.5–4.5)
Glucose: 133 mg/dL — ABNORMAL HIGH (ref 65–99)
POTASSIUM: 4 mmol/L (ref 3.5–5.2)
SODIUM: 141 mmol/L (ref 134–144)
Total Protein: 7.6 g/dL (ref 6.0–8.5)

## 2017-04-05 LAB — CBC
HEMATOCRIT: 35 % (ref 34.0–46.6)
Hemoglobin: 11.6 g/dL (ref 11.1–15.9)
MCH: 28.4 pg (ref 26.6–33.0)
MCHC: 33.1 g/dL (ref 31.5–35.7)
MCV: 86 fL (ref 79–97)
PLATELETS: 258 10*3/uL (ref 150–379)
RBC: 4.09 x10E6/uL (ref 3.77–5.28)
RDW: 14.9 % (ref 12.3–15.4)
WBC: 5.6 10*3/uL (ref 3.4–10.8)

## 2017-04-05 LAB — TSH: TSH: 5.26 u[IU]/mL — ABNORMAL HIGH (ref 0.450–4.500)

## 2017-04-05 LAB — VITAMIN B12: Vitamin B-12: 557 pg/mL (ref 232–1245)

## 2017-04-13 ENCOUNTER — Telehealth: Payer: Self-pay | Admitting: Family Medicine

## 2017-04-13 NOTE — Telephone Encounter (Signed)
Please have [name] return around 7/5 to discuss her lab results and repeat blood work. She should have a repeat TSH, T3, and T4 obtained at this visit.

## 2017-04-14 NOTE — Telephone Encounter (Signed)
LVM on home number and spoke to husband asking for pt to call the office. If pt calls, please schedule and appt for pt to see Dr. Juleen China around 05/04/2017. See note below. Ottis Stain, CMA

## 2017-04-14 NOTE — Telephone Encounter (Signed)
Pt has an appointment on 05/02/17 to discuss lab results and repeat blood work. Ottis Stain, CMA

## 2017-04-27 ENCOUNTER — Ambulatory Visit (INDEPENDENT_AMBULATORY_CARE_PROVIDER_SITE_OTHER): Payer: Medicaid Other | Admitting: Family Medicine

## 2017-04-27 VITALS — Ht 64.0 in | Wt 142.8 lb

## 2017-04-27 DIAGNOSIS — E119 Type 2 diabetes mellitus without complications: Secondary | ICD-10-CM | POA: Diagnosis present

## 2017-04-27 NOTE — Patient Instructions (Addendum)
  Diet Recommendations for Diabetes   Starchy (carb) foods include: Bread, rice, pasta, potatoes, corn, crackers, bagels, muffins, all baked goods.  (Fruits, milk, and yogurt also have carbohydrate, but most of these foods will not spike your blood sugar as the starchy foods will.)  A few fruits do cause high blood sugars; use small portions of bananas (limit to 1/2 at a time), grapes, watermelon, and most tropical fruits.    Protein foods include: Meat, fish, poultry, eggs, dairy foods, and beans such as pinto and kidney beans (beans also provide carbohydrate).   1. Eat at least 3 meals and 1-2 snacks per day. Never go more than 4-5 hours while awake without eating. Eat breakfast within the first hour of getting up.   2. Limit starchy foods to TWO per meal and ONE per snack. ONE portion of a starchy  food is equal to the following:   - ONE slice of bread (or its equivalent, such as half of a hamburger bun).   - 1/2 cup of a "scoopable" starchy food such as potatoes or rice.   - 15 grams of carbohydrate as shown on food label.  3. Include at every meal: a protein food, a carb food, and vegetables and/or fruit.   - Obtain twice as many veg's as protein or carbohydrate foods for both lunch and dinner.   - Fresh or frozen veg's are best.   - Try to keep frozen veg's on hand for a quick vegetable serving.      Ask your doctor about Metformin XR to help have less diarrhea.  Consider taking a Calcium (500-600mg  daily) and Vitamin D (1000-2000 units daily).  You might ask your doctor to check Vitamin D level at next visit.  Next appointment Thursday 06/08/17 at 10am (60 minutes). (Stop and schedule at the front desk)

## 2017-04-27 NOTE — Progress Notes (Signed)
CC:  Primary concerns today: Blood sugar control.   Learning Readiness:   Not ready  Contemplating  Ready  Change in progress  Usual eating pattern includes 2 meals and 2-3 snacks per day. Frequent foods and beverages include bread, chicken, meat, salad, tea in the mornings, water.   Usual physical activity includes none.  24-hr recall: (Up at 5 AM and then back to sleep until 10am) B (10 AM)-   Tea with 2 tbsp powdered milk, sugar cookie   L (12 PM)-  2c Stew with okra, beef, flatbread (very thin, wheat flour and cornmeal) , water D (8 PM)-  2c Stew with okra, beef, flatbread, water Typical day? Yes.    CBGs - checking 2-3 times daily fastings 110-200s (knows that this is related to her last meal before she goes to bed)  Doesn't have a regular eating pattern.  Handouts given during visit include:  AVS  Demonstrated degree of understanding via:  Teach Back  Barriers to learning/adherence to lifestyle change: language barrier  Virginia Crews, MD, MPH PGY-3,  [facility] 04/27/2017 9:05 AM

## 2017-05-02 ENCOUNTER — Encounter: Payer: Self-pay | Admitting: Internal Medicine

## 2017-05-02 ENCOUNTER — Ambulatory Visit (INDEPENDENT_AMBULATORY_CARE_PROVIDER_SITE_OTHER): Payer: Medicaid Other | Admitting: Internal Medicine

## 2017-05-02 VITALS — BP 130/60 | HR 82 | Temp 98.3°F | Ht 64.0 in | Wt 144.4 lb

## 2017-05-02 DIAGNOSIS — E119 Type 2 diabetes mellitus without complications: Secondary | ICD-10-CM

## 2017-05-02 DIAGNOSIS — R946 Abnormal results of thyroid function studies: Secondary | ICD-10-CM | POA: Diagnosis not present

## 2017-05-02 DIAGNOSIS — R7989 Other specified abnormal findings of blood chemistry: Secondary | ICD-10-CM

## 2017-05-02 MED ORDER — GLIPIZIDE 5 MG PO TABS: 5.0000 mg | ORAL_TABLET | Freq: Every day | ORAL | 1 refills | Status: DC

## 2017-05-02 NOTE — Progress Notes (Signed)
   Subjective:    [name] - 51 y.o. female MRN [id number]  Date of birth: 1966/03/18  HPI  [name] is here for follow up of thyroid labs.  Elevated TSH: Patient seen for fatigue on 04/04/17. Labs obtained and notable for minimally elevated TSH of 5.3. Patient denies personal or family history of thyroid disorders. Reports fatigue and feeling hot when others are comfortable. She denies changes in skin, hair and nails. No change in weight.   Diarrhea: Patient reports four years of diarrhea. Reports this started when she started Metformin when she was diagnosed for T2DM. She has tried various doses of Metformin and had GI symptoms with each dose. She is currently taking 1000 mg BID.   -  reports that she has never smoked. She has never used smokeless tobacco. - Review of Systems: Per HPI. - Past Medical History: Patient Active Problem List   Diagnosis Date Noted  . Rash and nonspecific skin eruption 04/04/2017  . Fatigue 04/04/2017  . Pain, dental 12/08/2016  . Allergic dermatitis 09/17/2015  . Lactose intolerance 08/13/2015  . Abdominal pain, epigastric 06/11/2015  . Cervical strain 04/07/2015  . Left genital labial abscess 06/05/2014  . Arthralgia 05/05/2014  . Chronic headaches 01/06/2014  . Sinusitis, chronic 09/30/2013  . Amenorrhea 03/29/2013  . Right upper quadrant abdominal pain 01/17/2013  . Diabetes mellitus ([place]) 01/17/2013  . 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 Type 2 diabetes mellitus without complication, without long-term current use of insulin (H[facility]) Suspect diarrhea related to Metformin intolerance. Will transition to Glypizide 5 mg daily. Counseled on potential for hypoglycemia with medication. Patient able to verbalize symptoms of low blood sugars. Patient does have glucometer at home. Recommended daily CBG checks until follow up in 1 month. If diarrhea persistent with discontinuation of Metformin, would warrant further work up.  - glipiZIDE (GLUCOTROL) 5 MG tablet; Take 1 tablet (5 mg total) by mouth daily before breakfast.  Dispense: 30 tablet; Refill: 1  2. Elevated TSH Will repeat lab work today. - TSH - T3, Free - T4, Free   Phill Myron, D.O. 05/02/2017, 3:36 PM PGY-3, [place]

## 2017-05-02 NOTE — Patient Instructions (Signed)
Please discontinue the Metformin and start the Glipizide. You will take this pill once per day. Glipizide can cause low blood sugars so if you feel any symptoms of low sugars check your blood sugar right away. Please monitor your blood sugar once per day and record this number. Bring the log to your next visit.   We are checking thyroid labs today. I contact you with these results.

## 2017-05-03 LAB — TSH: TSH: 3.51 u[IU]/mL (ref 0.450–4.500)

## 2017-05-03 LAB — T4, FREE: Free T4: 1.04 ng/dL (ref 0.82–1.77)

## 2017-05-03 LAB — T3, FREE: T3, Free: 2.9 pg/mL (ref 2.0–4.4)

## 2017-05-16 ENCOUNTER — Telehealth: Payer: Self-pay | Admitting: *Deleted

## 2017-05-16 NOTE — Telephone Encounter (Signed)
Left detailed message on VM informing pt of the below information. Katharina Caper, April D, [place]

## 2017-05-16 NOTE — Telephone Encounter (Signed)
Pt was advised. ep °

## 2017-05-16 NOTE — Telephone Encounter (Signed)
-----   Message from Nicolette Bang, DO sent at 05/16/2017  9:23 AM EDT ----- Please call patient to let her know repeat thyroid studies were normal.   Phill Myron, D.O. 05/16/2017, 9:23 AM PGY-3, [place]

## 2017-06-07 ENCOUNTER — Ambulatory Visit (INDEPENDENT_AMBULATORY_CARE_PROVIDER_SITE_OTHER): Payer: Medicaid Other | Admitting: Internal Medicine

## 2017-06-07 ENCOUNTER — Encounter: Payer: Self-pay | Admitting: Internal Medicine

## 2017-06-07 VITALS — BP 122/60 | HR 89 | Temp 97.5°F | Ht 64.0 in | Wt 142.0 lb

## 2017-06-07 DIAGNOSIS — R300 Dysuria: Secondary | ICD-10-CM

## 2017-06-07 DIAGNOSIS — E119 Type 2 diabetes mellitus without complications: Secondary | ICD-10-CM

## 2017-06-07 DIAGNOSIS — L309 Dermatitis, unspecified: Secondary | ICD-10-CM

## 2017-06-07 MED ORDER — METFORMIN HCL 1000 MG PO TABS: 1000.0000 mg | ORAL_TABLET | Freq: Two times a day (BID) | ORAL | 3 refills | Status: DC

## 2017-06-07 MED ORDER — CEPHALEXIN 500 MG PO CAPS: 500.0000 mg | ORAL_CAPSULE | Freq: Two times a day (BID) | ORAL | 0 refills | Status: DC

## 2017-06-07 MED ORDER — TRIAMCINOLONE ACETONIDE 0.025 % EX OINT: 1.0000 "application " | TOPICAL_OINTMENT | Freq: Two times a day (BID) | CUTANEOUS | 0 refills | Status: DC

## 2017-06-07 NOTE — Progress Notes (Signed)
Subjective:    [name] - 51 y.o. female MRN [id number]  Date of birth: 1966-04-04  HPI  [name] is here for T2DM follow up.  T2DM: At last office visit, changed metformin to glipizide due to report of chronic diarrhea that correlated with onset of metformin use several years ago. However, patient reports for the past month she has eliminated milk, yogurt, and cheese from her diet and continued to take the Metformin. She is not having any GI problems at present and wishes to continue Metformin. She never started the Glipizide prescribed. She reports that her fasting CBGs are usually around 180. A few months ago they were in the 140s. Has trouble adhering to a diabetic diet.   Eczema: Eczema on LE is persistent. Was improving with hydrocortisone cream but feels like it is no longer working. Last used hydrocortisone cream about two days ago. Skin lesions are very itchy. No pain or drainage.   Dysuria: Reports "hot urine" with burning sensation every time she urinates for the past 7 days. She endorses urinary frequency and suprapubic pain with urination. No vaginal discharge, back pain, nausea/vomiting or fevers. No hematuria. She has had UTI many years ago and these symptoms feel similar.    -  reports that she has never smoked. She has never used smokeless tobacco. - Review of Systems: Per HPI. - Past Medical History: Patient Active Problem List   Diagnosis Date Noted  . Rash and nonspecific skin eruption 04/04/2017  . Fatigue 04/04/2017  . Pain, dental 12/08/2016  . Allergic dermatitis 09/17/2015  . Lactose intolerance 08/13/2015  . Abdominal pain, epigastric 06/11/2015  . Cervical strain 04/07/2015  . Left genital labial abscess 06/05/2014  . Arthralgia 05/05/2014  . Chronic headaches 01/06/2014  . Sinusitis, chronic 09/30/2013  . Amenorrhea 03/29/2013  . Right upper quadrant abdominal pain 01/17/2013  . Diabetes mellitus ([place]) 01/17/2013  . WOLFF  (WOLFE)-PARKINSON-WHITE (WPW) SYNDROME 11/04/2010   - Medications: reviewed and updated   Objective:   Physical Exam BP 122/60   Pulse 89   Temp (!) 97.5 F (36.4 C) (Oral)   Ht 5\' 4"  (1.626 m)   Wt 142 lb (64.4 kg)   SpO2 99%   BMI 24.37 kg/m  Gen: NAD, alert, cooperative with exam, well-appearing Abd: SNTND, BS present, no guarding or organomegaly, no CVA tenderness  Skin: Dry, scaling, hyperpigmented and thickened plaques on the lower extremities bilaterally     Assessment & Plan:   1. Dysuria Symptoms consistent with acute cystitis. No vaginal discharge to suggest STI as cause. No systemic symptoms to suggest pyelonephritis.  - cephALEXin (KEFLEX) 500 MG capsule; Take 1 capsule (500 mg total) by mouth 2 (two) times daily.  Dispense: 10 capsule; Refill: 0 -treat for presumptive UTI with antibiotic as above, if symptoms do not resolve will need to return for further work up   2. Type 2 diabetes mellitus without complication, without long-term current use of insulin ([place]) Patient wishes to continue with Metformin in place of Glipizide as diarrhea has resolved with reduction of lactose intake. A1c has been slowly up-trending as has fasting CBGs.  - metFORMIN (GLUCOPHAGE) 1000 MG tablet; Take 1 tablet (1,000 mg total) by mouth 2 (two) times daily with a meal.  Dispense: 180 tablet; Refill: 3 -discussed importance of diabetic diet adherence and physical activity at length  -follow up in 2 months for repeat A1c -if not improved, would likely need second agent   3. Eczema, unspecified type  Not improved with hydrocortisone cream. Will prescribe higher potency steroid.  - triamcinolone (KENALOG) 0.025 % ointment; Apply 1 application topically 2 (two) times daily.  Dispense: 30 g; Refill: 0 -encouraged use of Emollients  Phill Myron, D.O. 06/07/2017, 9:53 AM PGY-3, [place]

## 2017-06-07 NOTE — Assessment & Plan Note (Signed)
Not improved with hydrocortisone cream. Will prescribe higher potency steroid.  - triamcinolone (KENALOG) 0.025 % ointment; Apply 1 application topically 2 (two) times daily.  Dispense: 30 g; Refill: 0 -encouraged use of Emollients

## 2017-06-07 NOTE — Patient Instructions (Signed)
I have sent an antibiotic to your pharmacy called Keflex. You will take this twice per day for five days. Please return if the burning sensation with urination does not resolve.

## 2017-06-07 NOTE — Assessment & Plan Note (Signed)
Patient wishes to continue with Metformin in place of Glipizide as diarrhea has resolved with reduction of lactose intake. A1c has been slowly up-trending as has fasting CBGs.  - metFORMIN (GLUCOPHAGE) 1000 MG tablet; Take 1 tablet (1,000 mg total) by mouth 2 (two) times daily with a meal.  Dispense: 180 tablet; Refill: 3 -discussed importance of diabetic diet adherence and physical activity at length  -follow up in 2 months for repeat A1c -if not improved, would likely need second agent

## 2017-06-08 ENCOUNTER — Ambulatory Visit (INDEPENDENT_AMBULATORY_CARE_PROVIDER_SITE_OTHER): Payer: Medicaid Other | Admitting: Family Medicine

## 2017-06-08 ENCOUNTER — Encounter: Payer: Self-pay | Admitting: Family Medicine

## 2017-06-08 VITALS — Ht 64.0 in | Wt 142.2 lb

## 2017-06-08 DIAGNOSIS — E119 Type 2 diabetes mellitus without complications: Secondary | ICD-10-CM

## 2017-06-08 MED ORDER — CEPHALEXIN 250 MG PO TABS: 500.0000 mg | ORAL_TABLET | Freq: Two times a day (BID) | ORAL | 0 refills | Status: AC

## 2017-06-08 NOTE — Patient Instructions (Addendum)
  Please make a follow up appointment as your schedule allows for a Thursday morning in October or November. Call Dr. Jenne Campus at [phone].  I sent a prescription for the antibiotic in tablet form to take 2 pills (500mg  total) twice a day for 5 days.   Goals: 1. Eat protein with your breakfast- this could be eggs, beans, or yogurt. Try to cut down on the number of cookies.   2. Eat a protein and vegetable at dinner time. This does not have to be a huge meal. This will help to keep your blood sugars consistent.   3. Walk 30 minutes at least 3 times a week for dedicated exercise.  Diet Recommendations for Diabetes   Starchy (carb) foods: Bread, rice, pasta, potatoes, corn, cereal, grits, crackers, bagels, muffins, all baked goods.  (Fruits, milk, and yogurt also have carbohydrate, but most of these foods will not spike your blood sugar as the starchy foods will.)  A few fruits do cause high blood sugars; use small portions of bananas (limit to 1/2 at a time), grapes, watermelon, oranges, and most tropical fruits.    Protein foods: Meat, fish, poultry, eggs, dairy foods, and beans such as pinto and kidney beans (beans also provide carbohydrate).   1. Eat at least 3 meals and 1-2 snacks per day. Never go more than 4-5 hours while awake without eating. Eat breakfast within the first hour of getting up.   2. Limit starchy foods to TWO per meal and ONE per snack. ONE portion of a starchy  food is equal to the following:   - ONE slice of bread (or its equivalent, such as half of a hamburger bun).   - 1/2 cup of a "scoopable" starchy food such as potatoes or rice.   - 15 grams of carbohydrate as shown on food label.  3. Include at every meal: a protein food, a carb food, and vegetables and/or fruit.   - Obtain twice the volume of veg's as protein or carbohydrate foods for both lunch and dinner.   - Fresh or frozen veg's are best.   - Keep frozen veg's on hand for a quick vegetable serving.        Lucila Maine, DO PGY-2, [facility] 06/08/2017 10:58 AM

## 2017-06-08 NOTE — Progress Notes (Signed)
CC:  Primary concerns today: follow advice we give her. Get to a healthy weight.  Reports she has been working on following advice from previous visit and has maybe met the advice "80 percent".  24-hr recall: 24-hr recall: (Up at  5 AM) B (10 AM)- Tea with some milk, 3-4 homemade cookie without sugar in it Snk ( AM)-   L ( 1 PM)- grilled chicken, salad, macaroni with sauce 1/2 c Snk ( PM)-   D ( PM)-   Snk (11 PM)-  1 c whole milk before bed Typical day? Yes.    Lives with her 3 children. Per daughters in room one did not feel hungry and did not have dinner, the other had macaroni and french fries. Patient states she has plenty of food available and does not force children to eat, they can eat what they want when they are hungry.   CBGs - checking 2-3 times daily- highest number 200, lowest 140 Fasting- 190, 180 fasting .  During day- 140-160 Before bed- 160 Has not had any low readings but endorses symptoms of low sugars. She states she has felt "shaky" and "weak" with her sugar being 140.   Came from [place] and used to eat "3 or 4 course meals with protein, vegetables, and some bread".  She walked most places in [place] and was very active.   Identified goals: 1- protein with breakfast every day 2- eat protein and vegetable at least for dinner every day 3- exercise 30 minutes 3x a week  Handouts given during visit include:  AVS  Goals sheet  Demonstrated degree of understanding via:  Teach Back  Barriers to learning/adherence to lifestyle change: language barrier, religious fasting coming up, low appetite  Follow up 4 weeks in nutrition clinic.  Lucila Maine, DO PGY-2, [facility] 06/08/2017 10:18 AM

## 2017-06-08 NOTE — Addendum Note (Signed)
Addended by: Lucila Maine C on: 06/08/2017 11:16 AM   Modules accepted: Orders

## 2017-07-11 ENCOUNTER — Ambulatory Visit: Payer: Medicaid Other | Admitting: Family Medicine

## 2017-07-13 ENCOUNTER — Ambulatory Visit (INDEPENDENT_AMBULATORY_CARE_PROVIDER_SITE_OTHER): Payer: Self-pay | Admitting: Internal Medicine

## 2017-07-13 ENCOUNTER — Encounter: Payer: Self-pay | Admitting: Internal Medicine

## 2017-07-13 VITALS — Ht 64.0 in | Wt 143.4 lb

## 2017-07-13 DIAGNOSIS — E119 Type 2 diabetes mellitus without complications: Secondary | ICD-10-CM

## 2017-07-13 NOTE — Progress Notes (Signed)
Nutrition Visit   Primary Concerns: Wanted to follow up from previous nutrition visit.  Barriers to learning/adherence to lifestyle change: Busy schedule with working & evening classes.   Usual eating pattern includes 2 meals (lunch and dinner typically) and frequent snacking per day. Frequent foods and beverages include tea, water, chicken, beef, fish, salads, whole wheat pita, fruits (apple, pear, plum, peach).   Typical Exercise Pattern: None   24-hr recall: (Up at 6 AM) Breakfast ( 10 AM)-  2 boiled eggs, tea  Snack ( AM)-    Lunch ( 12 PM)- grilled chkn, salad, 1c rice, water  Snack ( 2 PM)- 1 plum, 1 pear  Dinner ( 8 PM)- 1.5c lentil & veggie stew, salad w/ cucumber, tomato w/ lemon + salt, water   Snack ( PM)-   Typical day? Yes.    CBGs: Fasting: 120s-140s, outlier of 186 (today)   Goals:  1. At least 10 minutes of walking/exercise three days/week. Build in more movement throughout the day by parking car further away at work/school, using stairs instead of elevators, up/down or march in place during TV ads.  Phill Myron, D.O. 07/13/2017, 10:55 AM PGY-3, [place]

## 2017-07-13 NOTE — Patient Instructions (Signed)
Goals:  1. At least 10 minutes of walking/exercise three days/week. Build in more movement throughout the day by parking car further away at work/school, using stairs instead of elevators, up/down or march in place during TV ads.  Eat at least 3 REAL meals and 1-2 snacks per day.  Aim for no more than 5 hours between eating.  Eat breakfast within one hour of getting up.  A REAL meal includes at least some protein, some starch, and vegetables and/or fruit.    Check your blood sugar once per day, first thing in morning prior to eating.

## 2017-07-21 ENCOUNTER — Encounter: Payer: Self-pay | Admitting: Internal Medicine

## 2017-07-21 ENCOUNTER — Emergency Department (HOSPITAL_COMMUNITY)
Admission: EM | Admit: 2017-07-21 | Discharge: 2017-07-21 | Disposition: A | Discharge status: Home / Self Care | Payer: Self-pay | Attending: Emergency Medicine | Admitting: Emergency Medicine

## 2017-07-21 ENCOUNTER — Encounter (HOSPITAL_COMMUNITY): Payer: Self-pay | Admitting: Neurology

## 2017-07-21 ENCOUNTER — Ambulatory Visit (INDEPENDENT_AMBULATORY_CARE_PROVIDER_SITE_OTHER): Payer: Self-pay | Admitting: Internal Medicine

## 2017-07-21 VITALS — BP 160/90 | HR 70 | Temp 98.3°F | Ht 64.0 in | Wt 142.0 lb

## 2017-07-21 DIAGNOSIS — E119 Type 2 diabetes mellitus without complications: Secondary | ICD-10-CM | POA: Insufficient documentation

## 2017-07-21 DIAGNOSIS — R300 Dysuria: Secondary | ICD-10-CM

## 2017-07-21 DIAGNOSIS — H1013 Acute atopic conjunctivitis, bilateral: Secondary | ICD-10-CM

## 2017-07-21 DIAGNOSIS — R55 Syncope and collapse: Secondary | ICD-10-CM | POA: Insufficient documentation

## 2017-07-21 DIAGNOSIS — Z79899 Other long term (current) drug therapy: Secondary | ICD-10-CM | POA: Insufficient documentation

## 2017-07-21 DIAGNOSIS — Z7984 Long term (current) use of oral hypoglycemic drugs: Secondary | ICD-10-CM | POA: Insufficient documentation

## 2017-07-21 LAB — POCT URINALYSIS DIP (MANUAL ENTRY)
BILIRUBIN UA: NEGATIVE mg/dL
Bilirubin, UA: NEGATIVE
Glucose, UA: NEGATIVE mg/dL
Leukocytes, UA: NEGATIVE
Nitrite, UA: NEGATIVE
PROTEIN UA: NEGATIVE mg/dL
RBC UA: NEGATIVE
SPEC GRAV UA: 1.02 (ref 1.010–1.025)
UROBILINOGEN UA: 0.2 U/dL
pH, UA: 6.5 (ref 5.0–8.0)

## 2017-07-21 LAB — POCT GLYCOSYLATED HEMOGLOBIN (HGB A1C): Hemoglobin A1C: 8

## 2017-07-21 MED ORDER — CEFTRIAXONE SODIUM 250 MG IJ SOLR
250.0000 mg | Freq: Once | INTRAMUSCULAR | Status: AC
  Administered 2017-07-21: 250 mg via INTRAMUSCULAR

## 2017-07-21 MED ORDER — OLOPATADINE HCL 0.2 % OP SOLN: OPHTHALMIC | 1 refills | Status: DC

## 2017-07-21 MED ORDER — CIPROFLOXACIN HCL 250 MG PO TABS: 250.0000 mg | ORAL_TABLET | Freq: Two times a day (BID) | ORAL | 0 refills | Status: DC

## 2017-07-21 MED ORDER — PHENAZOPYRIDINE HCL 95 MG PO TABS: 95.0000 mg | ORAL_TABLET | Freq: Three times a day (TID) | ORAL | 0 refills | Status: DC | PRN

## 2017-07-21 NOTE — ED Provider Notes (Signed)
[facility] DEPT Provider Note   CSN: [id number] Arrival date & time: 07/21/17  1751     History   Chief Complaint Chief Complaint  Patient presents with  . Allergic Reaction    HPI [name] is a 51 y.o. female.  51 year old female presents to the emergency department from family practice after receiving an IM shot of Rocephin. A proximally tender 15 minutes after receiving this injection, patient reports some haziness to her vision with associated nausea and feeling of coolness in her extremities. She reports having an episode of shaking. This has spontaneously improved. She currently has no complaints of blurry vision. No history or current angioedema, difficulty swallowing or dysphagia. No complaints of shortness of breath. No syncope, vomiting, rash.   The history is provided by the patient. No language interpreter was used.  Allergic Reaction    Past Medical History:  Diagnosis Date  . Abdominal ultrasound, abnormal    normal  . Abnormal abdominal CT scan 01/20/2005   and pelvic CT, normal   . Depressed   . Diabetes mellitus without complication ([place])   . Fatty liver   . History of barium enema    normal  . History of pelvic ultrasound 12/06   normal   . Somatic complaints, multiple     Patient Active Problem List   Diagnosis Date Noted  . Eczema 04/04/2017  . Allergic dermatitis 09/17/2015  . Lactose intolerance 08/13/2015  . Cervical strain 04/07/2015  . Arthralgia 05/05/2014  . Chronic headaches 01/06/2014  . Amenorrhea 03/29/2013  . Diabetes mellitus ([place]) 01/17/2013  . 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 APPENDECTOMY    . caesarean section     x2  . CHOLECYSTECTOMY  2009    OB History    Gravida Para Term Preterm AB Living   4 3 3   1 3    SAB TAB Ectopic Multiple Live Births       1           Home Medications    Prior to Admission medications     Medication Sig Start Date End Date Taking? Authorizing Provider  ciprofloxacin (CIPRO) 250 MG tablet Take 1 tablet (250 mg total) by mouth 2 (two) times daily. 07/21/17   Nicolette Bang, DO  metFORMIN (GLUCOPHAGE) 1000 MG tablet Take 1 tablet (1,000 mg total) by mouth 2 (two) times daily with a meal. 06/07/17   Nicolette Bang, DO  Olopatadine HCl 0.2 % SOLN Place one drop in each eye daily. 07/21/17   Nicolette Bang, DO  phenazopyridine (PYRIDIUM) 95 MG tablet Take 1 tablet (95 mg total) by mouth 3 (three) times daily as needed for pain. 07/21/17   Nicolette Bang, DO    Family History Family History  Problem Relation Age of Onset  . Throat cancer Father        non-smoker  . Diabetes Mother   . Hypertension Mother     Social History Social History  Substance Use Topics  . Smoking status: Never Smoker  . Smokeless tobacco: Never Used  . Alcohol use No     Allergies   Patient has no known allergies.   Review of Systems Review of Systems Ten systems reviewed and are negative for acute change, except as noted in the HPI.    Physical Exam Updated Vital Signs BP (!) 142/80   Pulse (!) 56   Temp 98.3 F (36.8  C) (Oral)   Resp 16   SpO2 100%   Physical Exam  Constitutional: She is oriented to person, place, and time. She appears well-developed and well-nourished. No distress.  Nontoxic and in NAD  HENT:  Head: Normocephalic and atraumatic.  Oropharynx clear. No angioedema. Patient tolerating secretions without difficulty.  Eyes: Conjunctivae and EOM are normal. No scleral icterus.  Neck: Normal range of motion.  No stridor  Cardiovascular: Normal rate, regular rhythm and intact distal pulses.   Pulmonary/Chest: Effort normal. No respiratory distress. She has no wheezes. She has no rales.  Respirations even and unlabored. Lungs clear to auscultation bilaterally.  Musculoskeletal: Normal range of motion.  Neurological: She is alert  and oriented to person, place, and time. She exhibits normal muscle tone. Coordination normal.  Skin: Skin is warm and dry. No rash noted. She is not diaphoretic. No erythema. No pallor.  Psychiatric: She has a normal mood and affect. Her behavior is normal.  Nursing note and vitals reviewed.    ED Treatments / Results  Labs (all labs ordered are listed, but only abnormal results are displayed) Labs Reviewed - No data to display  EKG  EKG Interpretation None       Radiology No results found.  Procedures Procedures (including critical care time)  Medications Ordered in ED Medications - No data to display   Initial Impression / Assessment and Plan / ED Course  I have reviewed the triage vital signs and the nursing notes.  Pertinent labs & imaging results that were available during my care of the patient were reviewed by me and considered in my medical decision making (see chart for details).     51 year old female presents to the emergency department from family practice over concern for allergic reaction. Patient with a reassuring physical exam. Many of the symptoms which she was experiencing previously have spontaneously improved. Onset of symptoms was after receiving an IM shot of Rocephin. The patient has never had any angioedema, dysphasia, wheezing, hypoxia, or shortness of breath. No urticaria. Patient does report not liking IM injections. Her symptoms seem more consistent with a vasovagal response. I have a low suspicion for antibiotic allergy. The patient has been observed for at least 4 hours since onset of her symptoms which have now largely resolved. I do not believe further emergent workup or observation is indicated. Patient to follow-up with her primary care doctor PRN. Return precautions discussed and provided. Patient discharged in stable condition with no unaddressed concerns.   Final Clinical Impressions(s) / ED Diagnoses   Final diagnoses:  Vasovagal  response    New Prescriptions Discharge Medication List as of 07/21/2017  9:54 PM       Shelly Breach, PA-C 07/21/17 2329    Duffy Bruce, MD 07/22/17 1114

## 2017-07-21 NOTE — ED Triage Notes (Addendum)
Pt is coming from family practice today being treated for UTI, given a shot of rocephin IM, then started having blurred vision, shaking, and feeling coolness in feet. Sent here for evaluation for possible reaction. At current, pt is a x 4, denies pain, blurry vision has improved, and other sx. Neuro intact, no weakness, no deficits. No respiratory sx. Airway intact.

## 2017-07-21 NOTE — Progress Notes (Signed)
   Subjective:    [name] - 50 y.o. female MRN [id number]  Date of birth: 1965/12/31  HPI  [name] is here for dysuria and itchy eyes.  Dysuria:  Patient seen on 8/8 and given Keflex to treat presumed acute cystitis. Patient was unable to take medication due to it being in capsule (consumption of Gelatin against religion). Symptoms resolved but for the past two weeks again she has had burning with urination. Also endorsing suprapubic pain and urinary frequency. No back pain or fevers. No nausea or vomiting. No vaginal discharge.   Itchy Eyes: Reports itchy, watery eyes bilaterally for several days. No purulent drainage or crusting of eyes. Eyes become red at nighttime. No known foreign bodies in eye or trauma. Does not wear contacts. Has some mild nasal congestion.    -  reports that she has never smoked. She has never used smokeless tobacco. - Review of Systems: Per HPI. - Past Medical History: Patient Active Problem List   Diagnosis Date Noted  . Eczema 04/04/2017  . Allergic dermatitis 09/17/2015  . Lactose intolerance 08/13/2015  . Cervical strain 04/07/2015  . Arthralgia 05/05/2014  . Chronic headaches 01/06/2014  . Amenorrhea 03/29/2013  . Diabetes mellitus ([place]) 01/17/2013  . 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   - Medications: reviewed and updated   Objective:   Physical Exam BP 122/82   Pulse 77   Temp 98.3 F (36.8 C) (Oral)   Ht 5\' 4"  (1.626 m)   Wt 142 lb (64.4 kg)   SpO2 98%   BMI 24.37 kg/m  Gen: NAD, alert, cooperative with exam, well-appearing HEENT: NCAT, PERRL, clear conjunctiva without drainage, no foreign bodies present in eyes, EOMI, nasal turbinates mildly edematous  CV: RRR, good S1/S2, no murmur, no edema, capillary refill brisk  Resp: CTABL, no wheezes, non-labored Abd: Soft, ND, BS present, no guarding or organomegaly, mildly TTP in suprapubic region, no CVA tenderness     Assessment & Plan:  1. Type 2  diabetes mellitus without complication, without long-term current use of insulin (HCC) A1c 8.0 (consistent with previous value of 7.8). Needs follow up visit to discuss T2DM.  - POCT glycosylated hemoglobin (Hb A1C)  2. Dysuria Symptoms concerning for acute cystitis; however, UA negative. Will culture urine and treat presumptively for uncomplicated acute cystitis. Will give CTX IM today and have sent Ciprofloxacin to pharmacy. (Patient unable to take Keflex due to gelatin capsule.) Have also sent pyridium for pain relief. If urine culture negative, and symptoms recur, would have higher suspicion for interstitial cystitis. Doubt STI given lack of vaginal discharge and monogamous relationship.  - POCT urinalysis dipstick - Urine Culture - ciprofloxacin (CIPRO) 250 MG tablet; Take 1 tablet (250 mg total) by mouth 2 (two) times daily.  Dispense: 6 tablet; Refill: 0 - phenazopyridine (PYRIDIUM) 95 MG tablet; Take 1 tablet (95 mg total) by mouth 3 (three) times daily as needed for pain.  Dispense: 10 tablet; Refill: 0  3. Allergic conjunctivitis of both eyes Symptoms seem consistent with allergic rhinitis. No symptoms or exam findings concerning for bacterial conjunctivitis. Doubt corneal abrasion given bilateral presentation.  - Olopatadine HCl 0.2 % SOLN; Place one drop in each eye daily.  Dispense: 2.5 mL; Refill: [place], D.O. 07/21/2017, 5:06 PM PGY-3, [place]

## 2017-07-21 NOTE — ED Notes (Signed)
Spoke with Dr. Ralene Bathe about the case, reports no need for med intervention or labs at this time. Will continue to monitor, pt to let someone know if she feels worse.

## 2017-07-21 NOTE — ED Notes (Signed)
Pt ambulatory with steady gait to restroom 

## 2017-07-21 NOTE — Addendum Note (Signed)
Addended by: Christen Bame D on: 07/21/2017 05:15 PM   Modules accepted: Orders

## 2017-07-21 NOTE — Patient Instructions (Signed)
We will give you an antibiotic shot today and sent in 3 days of antibiotics to your pharmacy. I have also sent in urinary pain relief for you and eye drops for the itchy, watery eyes. Please return if your symptoms do not improve.

## 2017-07-21 NOTE — Progress Notes (Signed)
Notably, approximately 15 minutes after patient received CTX IM she reported blurry vision, nausea, shaking and generalized weakness. No rash or sensation of throat closing. No trouble breathing. Patient re-evaluated.   Vitals: BP 160/90, HR 70, O2 sat 99%. Gen: WDWN, non-toxic appearing  HEENT: PERRL, EOMI, Visual acuity intact, oropharynx clear  CV: RRR. No murmurs.  Lungs: CTAB. Normal WOB.  Skin: No rashes.  Neuro: CN II-XII grossly intact. Strength 5/5 in all extremities. Sensation of extremities grossly intact. Speech clear. Thought content normal.   Patient seems to be having a reaction to CTX. BP noted to be considerably elevated from measurement at beginning of the visit. May simply be anxious after shot but given reported symptoms requires further monitoring. Patient seen by Dr. Gwendlyn Deutscher who agrees with transport to ED. RN transported patient to ED by wheelchair. Was able to contact charge nurse and inform her patient was being transported.   Phill Myron, D.O. 07/21/2017, 5:40 PM PGY-3, [place]

## 2017-07-23 LAB — URINE CULTURE

## 2017-08-14 ENCOUNTER — Ambulatory Visit: Payer: Medicaid Other | Admitting: Internal Medicine

## 2017-08-16 ENCOUNTER — Ambulatory Visit: Payer: Medicaid Other | Admitting: Internal Medicine

## 2017-12-28 ENCOUNTER — Other Ambulatory Visit: Payer: Self-pay | Admitting: Family Medicine

## 2017-12-28 ENCOUNTER — Other Ambulatory Visit (HOSPITAL_COMMUNITY)
Admission: RE | Admit: 2017-12-28 | Discharge: 2017-12-28 | Disposition: A | Discharge status: Home / Self Care | Payer: Self-pay | Source: Ambulatory Visit | Attending: Family Medicine | Admitting: Family Medicine

## 2017-12-28 DIAGNOSIS — Z124 Encounter for screening for malignant neoplasm of cervix: Secondary | ICD-10-CM | POA: Insufficient documentation

## 2018-01-02 LAB — CYTOLOGY - PAP
Diagnosis: NEGATIVE
HPV: NOT DETECTED

## 2018-02-12 LAB — HM COLONOSCOPY

## 2018-04-02 ENCOUNTER — Ambulatory Visit (INDEPENDENT_AMBULATORY_CARE_PROVIDER_SITE_OTHER): Payer: PRIVATE HEALTH INSURANCE

## 2018-04-02 ENCOUNTER — Ambulatory Visit (HOSPITAL_COMMUNITY)
Admission: EM | Admit: 2018-04-02 | Discharge: 2018-04-02 | Disposition: A | Discharge status: Home / Self Care | Payer: PRIVATE HEALTH INSURANCE | Attending: Family Medicine | Admitting: Family Medicine

## 2018-04-02 ENCOUNTER — Encounter (HOSPITAL_COMMUNITY): Payer: Self-pay | Admitting: Emergency Medicine

## 2018-04-02 DIAGNOSIS — M25531 Pain in right wrist: Secondary | ICD-10-CM

## 2018-04-02 MED ORDER — IBUPROFEN 800 MG PO TABS: 800.0000 mg | ORAL_TABLET | Freq: Once | ORAL | Status: DC

## 2018-04-02 MED ORDER — IBUPROFEN 800 MG PO TABS
ORAL_TABLET | ORAL | Status: AC
  Filled 2018-04-02: qty 1

## 2018-04-02 MED ORDER — MELOXICAM 7.5 MG PO TABS: 7.5000 mg | ORAL_TABLET | Freq: Every day | ORAL | 0 refills | Status: DC

## 2018-04-02 NOTE — ED Provider Notes (Signed)
[facility]    CSN: [id number] Arrival date & time: 04/02/18  1626     History   Chief Complaint Chief Complaint  Patient presents with  . Wrist Pain    HPI MILLISA GIARRUSSO is a 52 y.o. female.   Azana presents with complaints of right wrist pain which has been intermittent for years. Worse since yesterday. No known injury or exacerbating trigger. Worse with  Movement. She is right handed. She is a Pharmacist, hospital. Worse with movements such as opening up a lid or lifting objects. Has not taken any medications for symptoms. States has been seen in the past without acute findings. No numbness or tingling. Hx WPF, DM, dermatitis, arthralgia.   ROS per HPI.      Past Medical History:  Diagnosis Date  . Abdominal ultrasound, abnormal    normal  . Abnormal abdominal CT scan 01/20/2005   and pelvic CT, normal   . Depressed   . Diabetes mellitus without complication ([place])   . Fatty liver   . History of barium enema    normal  . History of pelvic ultrasound 12/06   normal   . Somatic complaints, multiple     Patient Active Problem List   Diagnosis Date Noted  . Eczema 04/04/2017  . Allergic dermatitis 09/17/2015  . Lactose intolerance 08/13/2015  . Cervical strain 04/07/2015  . Arthralgia 05/05/2014  . Chronic headaches 01/06/2014  . Amenorrhea 03/29/2013  . Diabetes mellitus ([place]) 01/17/2013  . 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 APPENDECTOMY    . caesarean section     x2  . CHOLECYSTECTOMY  2009    OB History    Gravida  4   Para  3   Term  3   Preterm      AB  1   Living  3     SAB      TAB      Ectopic  1   Multiple      Live Births               Home Medications    Prior to Admission medications   Medication Sig Start Date End Date Taking? Authorizing Provider  ciprofloxacin (CIPRO) 250 MG tablet Take 1 tablet (250 mg total) by mouth 2 (two)  times daily. 07/21/17   Nicolette Bang, DO  meloxicam (MOBIC) 7.5 MG tablet Take 1 tablet (7.5 mg total) by mouth daily. 04/02/18   Zigmund Gottron, NP  metFORMIN (GLUCOPHAGE) 1000 MG tablet Take 1 tablet (1,000 mg total) by mouth 2 (two) times daily with a meal. 06/07/17   Nicolette Bang, DO  Olopatadine HCl 0.2 % SOLN Place one drop in each eye daily. 07/21/17   Nicolette Bang, DO  phenazopyridine (PYRIDIUM) 95 MG tablet Take 1 tablet (95 mg total) by mouth 3 (three) times daily as needed for pain. 07/21/17   Nicolette Bang, DO    Family History Family History  Problem Relation Age of Onset  . Throat cancer Father        non-smoker  . Diabetes Mother   . Hypertension Mother     Social History Social History   Tobacco Use  . Smoking status: Never Smoker  . Smokeless tobacco: Never Used  Substance Use Topics  . Alcohol use: No    Alcohol/week: 0.0 oz  . Drug use: No     Allergies  Patient has no known allergies.   Review of Systems Review of Systems   Physical Exam Triage Vital Signs ED Triage Vitals [04/02/18 1708]  Enc Vitals Group     BP 133/87     Pulse Rate 73     Resp 18     Temp 98.3 F (36.8 C)     Temp Source Oral     SpO2 99 %     Weight      Height      Head Circumference      Peak Flow      Pain Score      Pain Loc      Pain Edu?      Excl. in [place]?    No data found.  Updated Vital Signs BP 133/87 (BP Location: Left Arm)   Pulse 73   Temp 98.3 F (36.8 C) (Oral)   Resp 18   SpO2 99%    Physical Exam  Constitutional: She is oriented to person, place, and time. She appears well-developed and well-nourished. No distress.  Cardiovascular: Normal rate, regular rhythm and normal heart sounds.  Pulmonary/Chest: Effort normal and breath sounds normal.  Musculoskeletal:       Right wrist: She exhibits decreased range of motion, tenderness, bony tenderness and swelling. She exhibits no effusion, no  crepitus, no deformity and no laceration.  Tenderness and mild swelling over the ulnar tuberosity of right wrist; mild pain with all areas of ROM but pain worse with palpation; sensation intact; some generalized hand tenderness without specific point tenderness. Moving fingers; strong radial pulse; tips of fingers with dark henna therefore difficult to assess cap refill to finger tips  Neurological: She is alert and oriented to person, place, and time.  Skin: Skin is warm and dry.     UC Treatments / Results  Labs (all labs ordered are listed, but only abnormal results are displayed) Labs Reviewed - No data to display  EKG None  Radiology Dg Wrist Complete Right  Result Date: 04/02/2018 CLINICAL DATA:  Right wrist pain for 2 weeks, worse over the past 2 days. No known injury. EXAM: RIGHT WRIST - COMPLETE 3+ VIEW COMPARISON:  None. FINDINGS: There is no evidence of fracture or dislocation. Normal joint spaces and alignment. There is no evidence of arthropathy or other focal bone abnormality. Soft tissues are unremarkable. IMPRESSION: Negative radiographs of the right wrist. Electronically Signed   By: Jeb Levering M.D.   On: 04/02/2018 18:01    Procedures Procedures (including critical care time)  Medications Ordered in UC Medications  ibuprofen (ADVIL,MOTRIN) tablet 800 mg (has no administration in time range)    Initial Impression / Assessment and Plan / UC Course  I have reviewed the triage vital signs and the nursing notes.  Pertinent labs & imaging results that were available during my care of the patient were reviewed by me and considered in my medical decision making (see chart for details).     Xray negative. Strain vs arthritis vs tendonitis discussed. Ice, elevation, ace wrap, nsaids for pain control. Follow with PCp and/or sports med as needed. Patient verbalized understanding and agreeable to plan.    Final Clinical Impressions(s) / UC Diagnoses   Final  diagnoses:  Right wrist pain     Discharge Instructions     Ice, elevation and use of ace wrap for support and compression Daily Mobic to help with pain. Do not take additional ibuprofen. If no improvement or if worsening of symptoms  follow up with your primary care provider or with sports medicine    ED Prescriptions    Medication Sig Dispense Auth. Provider   meloxicam (MOBIC) 7.5 MG tablet Take 1 tablet (7.5 mg total) by mouth daily. 20 tablet Zigmund Gottron, NP     Controlled Substance Prescriptions [place] Controlled Substance Registry consulted? Not Applicable   Zigmund Gottron, NP 04/02/18 4445

## 2018-04-02 NOTE — Discharge Instructions (Signed)
Ice, elevation and use of ace wrap for support and compression Daily Mobic to help with pain. Do not take additional ibuprofen. If no improvement or if worsening of symptoms follow up with your primary care provider or with sports medicine

## 2018-04-02 NOTE — ED Triage Notes (Signed)
Pt here with right wrist pain x weeks worse over last two days

## 2018-05-11 ENCOUNTER — Encounter: Payer: Self-pay | Admitting: Cardiovascular Disease

## 2018-05-12 ENCOUNTER — Ambulatory Visit: Payer: Self-pay | Admitting: Cardiovascular Disease

## 2018-05-12 VITALS — BP 125/83 | HR 99 | Temp 97.3°F | Wt 140.0 lb

## 2018-05-12 DIAGNOSIS — E119 Type 2 diabetes mellitus without complications: Secondary | ICD-10-CM

## 2018-05-12 NOTE — Patient Instructions (Signed)
Medication Instructions: CONTINUE SAME MEDCATIONS  Labwork: Go to [facility] for labs  Procedures/Testing: none  Follow-Up: 3 months  Any Additional Special Instructions Will Be Listed Below (If Applicable).     If you need a refill on your cardiac medications before your next appointment, please call your pharmacy.

## 2018-05-12 NOTE — Progress Notes (Signed)
[facility]  Date:  05/12/2018   ID:  [name], DOB 1965/11/20, MRN [id number]  PCP:  Lourdes Sledge Team, MD    Chief Complaint  Patient presents with  . Cough    lost voice; itchy eyes; sore throat  . Headache  . Diabetes    blood sugar is 149      History of Present Illness: [name] is a 52 y.o. female who presents for  evaluation of diabetes as well as scratchy eyes and throat.  She has a history of WPW and was seen in the past by Dr. Lovena Le.  She was treated medically given lack of significant tachycardia or palpitations.  She reports significant improvement in diabetes control since she was started on Jardiance.  However, she has been mostly relying on samples and had to pay $500 1 month to get the medication.  She also reports scratchy nose, itchy eyes and some tingling in her throat.   Past Medical History:  Diagnosis Date  . Abdominal ultrasound, abnormal    normal  . Abnormal abdominal CT scan 01/20/2005   and pelvic CT, normal   . Depressed   . Diabetes mellitus without complication ([place])   . Fatty liver   . History of barium enema    normal  . History of pelvic ultrasound 12/06   normal   . Somatic complaints, multiple     Past Surgical History:  Procedure Laterality Date  . APPENDECTOMY    . caesarean section     x2  . CHOLECYSTECTOMY  2009     Current Outpatient Medications  Medication Sig Dispense Refill  . atorvastatin (LIPITOR) 20 MG tablet Take 20 mg by mouth daily.    . metFORMIN (GLUCOPHAGE) 1000 MG tablet Take 1 tablet (1,000 mg total) by mouth 2 (two) times daily with a meal. 180 tablet 3  . ciprofloxacin (CIPRO) 250 MG tablet Take 1 tablet (250 mg total) by mouth 2 (two) times daily. 6 tablet 0  . meloxicam (MOBIC) 7.5 MG tablet Take 1 tablet (7.5 mg total) by mouth daily. 20 tablet 0  . Olopatadine HCl 0.2 % SOLN Place one drop in each eye daily. 2.5 mL 1  . phenazopyridine (PYRIDIUM) 95 MG tablet Take 1 tablet (95 mg total) by  mouth 3 (three) times daily as needed for pain. 10 tablet 0   No current facility-administered medications for this visit.     Allergies:   Patient has no known allergies.    Social History:  The patient  reports that she has never smoked. She has never used smokeless tobacco. She reports that she does not drink alcohol or use drugs.   Family History:  The patient's family history includes Diabetes in her mother; Hypertension in her mother; Throat cancer in her father.    ROS:  Please see the history of present illness.   Otherwise, review of systems are positive for none.   All other systems are reviewed and negative.    PHYSICAL EXAM: VS:  BP 125/83   Pulse 99   Temp (!) 97.3 F (36.3 C)   Wt 140 lb (63.5 kg)   SpO2 (!) 70%   BMI 24.03 kg/m  , BMI Body mass index is 24.03 kg/m. GEN: Well nourished, well developed, in no acute distress  HEENT: normal  Neck: no JVD, carotid bruits, or masses Cardiac: RRR; no murmurs, rubs, or gallops,no edema  Respiratory:  clear to auscultation bilaterally, normal work of breathing  GI: soft, nontender, nondistended, + BS MS: no deformity or atrophy  Skin: warm and dry, no rash Neuro:  Strength and sensation are intact Psych: euthymic mood, full affect   EKG:  EKG is not ordered today.    Recent Labs: No results found for requested labs within last 8760 hours.    Lipid Panel No results found for: CHOL, TRIG, HDL, CHOLHDL, VLDL, LDLCALC, LDLDIRECT    Wt Readings from Last 3 Encounters:  05/12/18 140 lb (63.5 kg)  07/21/17 142 lb (64.4 kg)  07/13/17 143 lb 6.4 oz (65 kg)       No flowsheet data found.    ASSESSMENT AND PLAN:  1.  Type 2 diabetes: She reports significant improvement in control with Jardiance.  We are going to try to get assistance from the company to get her the medication.  I requested routine diabetic labs.  2.  Hyperlipidemia: Currently on atorvastatin.  Requested lipid profile.  She reports shortness  of breath currently she takes the medication but I explained to her that this is not a side effect of statins.  3.  Allergic rhinitis.:  I suggested using Benadryl as needed.    Disposition:   FU in 3 months  Signed,  Kathlyn Sacramento, MD  05/12/2018 12:05 PM

## 2018-08-18 ENCOUNTER — Ambulatory Visit: Payer: Self-pay | Admitting: Internal Medicine

## 2018-08-28 ENCOUNTER — Telehealth: Payer: Self-pay | Admitting: Family Medicine

## 2018-08-29 ENCOUNTER — Encounter (HOSPITAL_COMMUNITY): Payer: Self-pay

## 2018-08-29 ENCOUNTER — Other Ambulatory Visit: Payer: Self-pay

## 2018-08-29 ENCOUNTER — Ambulatory Visit (HOSPITAL_COMMUNITY)
Admission: EM | Admit: 2018-08-29 | Discharge: 2018-08-29 | Disposition: A | Discharge status: Home / Self Care | Payer: PRIVATE HEALTH INSURANCE | Attending: Family Medicine | Admitting: Family Medicine

## 2018-08-29 DIAGNOSIS — H9209 Otalgia, unspecified ear: Secondary | ICD-10-CM | POA: Diagnosis not present

## 2018-08-29 DIAGNOSIS — E119 Type 2 diabetes mellitus without complications: Secondary | ICD-10-CM | POA: Diagnosis not present

## 2018-08-29 DIAGNOSIS — J069 Acute upper respiratory infection, unspecified: Secondary | ICD-10-CM

## 2018-08-29 DIAGNOSIS — Z79899 Other long term (current) drug therapy: Secondary | ICD-10-CM | POA: Insufficient documentation

## 2018-08-29 DIAGNOSIS — Z8249 Family history of ischemic heart disease and other diseases of the circulatory system: Secondary | ICD-10-CM | POA: Insufficient documentation

## 2018-08-29 DIAGNOSIS — Z7984 Long term (current) use of oral hypoglycemic drugs: Secondary | ICD-10-CM | POA: Diagnosis not present

## 2018-08-29 DIAGNOSIS — J029 Acute pharyngitis, unspecified: Secondary | ICD-10-CM | POA: Diagnosis present

## 2018-08-29 DIAGNOSIS — R05 Cough: Secondary | ICD-10-CM | POA: Insufficient documentation

## 2018-08-29 DIAGNOSIS — Z808 Family history of malignant neoplasm of other organs or systems: Secondary | ICD-10-CM | POA: Insufficient documentation

## 2018-08-29 DIAGNOSIS — Z833 Family history of diabetes mellitus: Secondary | ICD-10-CM | POA: Diagnosis not present

## 2018-08-29 LAB — POCT RAPID STREP A: Streptococcus, Group A Screen (Direct): NEGATIVE

## 2018-08-29 MED ORDER — DM-GUAIFENESIN ER 30-600 MG PO TB12: 1.0000 | ORAL_TABLET | Freq: Two times a day (BID) | ORAL | 0 refills | Status: DC

## 2018-08-29 MED ORDER — FLUTICASONE PROPIONATE 50 MCG/ACT NA SUSP: 1.0000 | Freq: Every day | NASAL | 2 refills | Status: DC

## 2018-08-29 MED ORDER — CETIRIZINE HCL 5 MG PO TABS: 5.0000 mg | ORAL_TABLET | Freq: Every day | ORAL | 1 refills | Status: DC

## 2018-08-29 NOTE — Discharge Instructions (Addendum)
°  It was nice meeting you!!  I believe you have a viral upper respiratory infection. It could take 7 to 10 days for the symptoms to decrease or improve.  Zyrtec and mucinex for the drainage in your throat and congestion.  Flonase for nasal congestion.  Ibuprofen and tylenol can help with the pain and fever Follow up as needed for worsening symptoms.

## 2018-08-30 NOTE — ED Provider Notes (Signed)
[facility]    CSN: [id number] Arrival date & time: 08/29/18  1559     History   Chief Complaint Chief Complaint  Patient presents with  . Sore Throat  . Headache    HPI [name] is a 52 y.o. female.    URI  Presenting symptoms: congestion, cough, ear pain, fatigue, fever, rhinorrhea and sore throat   Severity:  Moderate Duration:  1 day Timing:  Constant Progression:  Unchanged Chronicity:  New Relieved by:  Nothing Worsened by:  Nothing Ineffective treatments:  None tried Associated symptoms: myalgias   Associated symptoms: no arthralgias, no headaches, no neck pain, no sinus pain, no sneezing, no swollen glands and no wheezing   Risk factors: no immunosuppression, no recent illness, no recent travel and no sick contacts     Past Medical History:  Diagnosis Date  . Abdominal ultrasound, abnormal    normal  . Abnormal abdominal CT scan 01/20/2005   and pelvic CT, normal   . Depressed   . Diabetes mellitus without complication ([place])   . Fatty liver   . History of barium enema    normal  . History of pelvic ultrasound 12/06   normal   . Somatic complaints, multiple     Patient Active Problem List   Diagnosis Date Noted  . Eczema 04/04/2017  . Allergic dermatitis 09/17/2015  . Lactose intolerance 08/13/2015  . Cervical strain 04/07/2015  . Arthralgia 05/05/2014  . Chronic headaches 01/06/2014  . Amenorrhea 03/29/2013  . Diabetes mellitus ([place]) 01/17/2013  . 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 APPENDECTOMY    . caesarean section     x2  . CHOLECYSTECTOMY  2009    OB History    Gravida  4   Para  3   Term  3   Preterm      AB  1   Living  3     SAB      TAB      Ectopic  1   Multiple      Live Births               Home Medications    Prior to Admission medications   Medication Sig Start Date End Date Taking? Authorizing  Provider  atorvastatin (LIPITOR) 20 MG tablet Take 20 mg by mouth daily.    [provider]  cetirizine (ZYRTEC) 5 MG tablet Take 1 tablet (5 mg total) by mouth daily. 08/29/18   Bryttani Blew, Tressia Miners A, NP  dextromethorphan-guaiFENesin (MUCINEX DM) 30-600 MG 12hr tablet Take 1 tablet by mouth 2 (two) times daily. 08/29/18   Branton Einstein, Tressia Miners A, NP  empagliflozin (JARDIANCE) 10 MG TABS tablet Take 10 mg by mouth daily.    [provider]  fluticasone (FLONASE) 50 MCG/ACT nasal spray Place 1 spray into both nostrils daily. 08/29/18   Loura Halt A, NP  meloxicam (MOBIC) 7.5 MG tablet Take 1 tablet (7.5 mg total) by mouth daily. 04/02/18   Zigmund Gottron, NP  metFORMIN (GLUCOPHAGE) 1000 MG tablet Take 1 tablet (1,000 mg total) by mouth 2 (two) times daily with a meal. 06/07/17   Nicolette Bang, DO  Olopatadine HCl 0.2 % SOLN Place one drop in each eye daily. 07/21/17   Nicolette Bang, DO    Family History Family History  Problem Relation Age of Onset  . Throat cancer Father  non-smoker  . Diabetes Mother   . Hypertension Mother     Social History Social History   Tobacco Use  . Smoking status: Never Smoker  . Smokeless tobacco: Never Used  Substance Use Topics  . Alcohol use: No    Alcohol/week: 0.0 standard drinks  . Drug use: No     Allergies   Patient has no known allergies.   Review of Systems Review of Systems  Constitutional: Positive for fatigue and fever.  HENT: Positive for congestion, ear pain, rhinorrhea and sore throat. Negative for sinus pain and sneezing.   Respiratory: Positive for cough. Negative for wheezing.   Musculoskeletal: Positive for myalgias. Negative for arthralgias and neck pain.  Neurological: Negative for headaches.     Physical Exam Triage Vital Signs ED Triage Vitals  Enc Vitals Group     BP 08/29/18 1707 127/77     Pulse Rate 08/29/18 1707 100     Resp 08/29/18 1707 18     Temp 08/29/18 1707 98 F (36.7 C)      Temp Source 08/29/18 1707 Oral     SpO2 08/29/18 1707 99 %     Weight 08/29/18 1709 138 lb (62.6 kg)     Height --      Head Circumference --      Peak Flow --      Pain Score 08/29/18 1709 5     Pain Loc --      Pain Edu? --      Excl. in [place]? --    No data found.  Updated Vital Signs BP 127/77 (BP Location: Right Arm)   Pulse 100   Temp 98 F (36.7 C) (Oral)   Resp 18   Wt 138 lb (62.6 kg)   SpO2 99%   BMI 23.69 kg/m   Visual Acuity Right Eye Distance:   Left Eye Distance:   Bilateral Distance:    Right Eye Near:   Left Eye Near:    Bilateral Near:     Physical Exam  Constitutional: She appears well-developed and well-nourished. She appears ill.  Non toxic   HENT:  Head: Normocephalic and atraumatic.  Right Ear: Hearing normal.  Left Ear: Hearing normal.  Mouth/Throat: Mucous membranes are normal.  Bilateral TMs normal.  External ears normal.  Without posterior oropharyngeal erythema, tonsillar swelling or exudates. No lesions.  Severe nasal turbinate swelling with thick mucus in nares. Sounds congested No lymphadenopathy    Neck: Normal range of motion.  Cardiovascular: Normal rate, regular rhythm and normal heart sounds.  Pulmonary/Chest: Effort normal and breath sounds normal.  Lungs clear in all fields. No dyspnea or distress. No retractions or nasal flaring.   Neurological: She is alert.  Skin: Skin is warm and dry. No rash noted. She is not diaphoretic. No erythema. No pallor.  Psychiatric: She has a normal mood and affect.  Nursing note and vitals reviewed.    UC Treatments / Results  Labs (all labs ordered are listed, but only abnormal results are displayed) Labs Reviewed  CULTURE, GROUP A STREP [facility])  POCT RAPID STREP A    EKG None  Radiology No results found.  Procedures Procedures (including critical care time)  Medications Ordered in UC Medications - No data to display  Initial Impression / Assessment and Plan / UC  Course  I have reviewed the triage vital signs and the nursing notes.  Pertinent labs & imaging results that were available during my care of the patient were reviewed  by me and considered in my medical decision making (see chart for details).     Rapid strep negative Most likely viral URI Symptomatic treatment with Zyrtec, Mucinex and Flonase. Tylenol/ibuprofen for pain or fever Follow up as needed for continued or worsening symptoms  Final Clinical Impressions(s) / UC Diagnoses   Final diagnoses:  Viral URI     Discharge Instructions      It was nice meeting you!!  I believe you have a viral upper respiratory infection. It could take 7 to 10 days for the symptoms to decrease or improve.  Zyrtec and mucinex for the drainage in your throat and congestion.  Flonase for nasal congestion.  Ibuprofen and tylenol can help with the pain and fever Follow up as needed for worsening symptoms.      ED Prescriptions    Medication Sig Dispense Auth. Provider   dextromethorphan-guaiFENesin (MUCINEX DM) 30-600 MG 12hr tablet Take 1 tablet by mouth 2 (two) times daily. 15 tablet Kammie Scioli A, NP   cetirizine (ZYRTEC) 5 MG tablet Take 1 tablet (5 mg total) by mouth daily. 30 tablet Riannah Stagner A, NP   fluticasone (FLONASE) 50 MCG/ACT nasal spray Place 1 spray into both nostrils daily. 16 g Loura Halt A, NP     Controlled Substance Prescriptions [employer] Controlled Substance Registry consulted? Not Applicable   Orvan July, NP 08/30/18 0830

## 2018-09-01 ENCOUNTER — Encounter: Payer: Self-pay | Admitting: Adult Health

## 2018-09-01 ENCOUNTER — Ambulatory Visit: Payer: Self-pay | Admitting: Adult Health

## 2018-09-01 VITALS — BP 143/82 | HR 81 | Temp 97.3°F | Resp 20 | Ht 64.8 in | Wt 140.0 lb

## 2018-09-01 DIAGNOSIS — E119 Type 2 diabetes mellitus without complications: Secondary | ICD-10-CM

## 2018-09-01 DIAGNOSIS — H1013 Acute atopic conjunctivitis, bilateral: Secondary | ICD-10-CM

## 2018-09-01 DIAGNOSIS — R109 Unspecified abdominal pain: Secondary | ICD-10-CM

## 2018-09-01 LAB — CULTURE, GROUP A STREP (THRC)

## 2018-09-01 MED ORDER — OLOPATADINE HCL 0.1 % OP SOLN: 1.0000 [drp] | Freq: Two times a day (BID) | OPHTHALMIC | 12 refills | Status: DC

## 2018-09-01 MED ORDER — BETAMETHASONE DIPROPIONATE 0.05 % EX CREA: TOPICAL_CREAM | Freq: Two times a day (BID) | CUTANEOUS | 0 refills | Status: DC

## 2018-09-01 NOTE — Progress Notes (Signed)
  A&P:  1. Controlled type 2 diabetes mellitus without complication, without long-term current use of insulin ([place])  Patient compliant with metformin and jardiance. Continue. Reports good control. Did not bring in any readings or glucometer. Checking HgbA1c  2. Flank pain Reports right flank pain - on and off x 1 month, painful urination, fever. No fever in clinic today. Tender upon palpation right flank area. Denies blood in urine. Order UA to evaluate if UTI - results pending. Will need antibiotic if determine UTI.   HPI:  Presents with c/o right flank pain. Here also for diabetes f/u  ROS:  Normal except for those discussed in HPI  EXAM:  Gen: Pleasant femal, NAD Pulm: CTA. Normal respiratory effort CV: RRR.  Abd: Flat, non tender GU: right flank tenderness upon palpation Extremities: Normal. No edema M/S: Gait normal. No use of accessory devices Psych: Normal affect, coorperative Neuro: no tremors. CN grossly normal

## 2018-09-01 NOTE — Patient Instructions (Signed)
Please have you blood and urine test done as soon as possible  If you have a urinary tract infection and need medication we will send that to you pharmacy  Return to clinic in 2 weeks for follow up

## 2018-09-05 ENCOUNTER — Other Ambulatory Visit: Payer: Self-pay | Admitting: Internal Medicine

## 2018-09-06 LAB — URINALYSIS, ROUTINE W REFLEX MICROSCOPIC
Bilirubin, UA: NEGATIVE
Ketones, UA: NEGATIVE
Leukocytes, UA: NEGATIVE
Nitrite, UA: NEGATIVE
Protein, UA: NEGATIVE
RBC, UA: NEGATIVE
Specific Gravity, UA: >=1.03 — AB (ref 1.005–1.030)
Urobilinogen, Ur: 0.2 mg/dL (ref 0.2–1.0)
pH, UA: 5 (ref 5.0–7.5)

## 2018-09-06 LAB — HGB A1C W/O EAG: Hgb A1c MFr Bld: 7.2 % — ABNORMAL HIGH (ref 4.8–5.6)

## 2018-10-06 ENCOUNTER — Ambulatory Visit: Payer: Self-pay | Admitting: Internal Medicine

## 2018-10-06 ENCOUNTER — Encounter: Payer: Self-pay | Admitting: Family Medicine

## 2018-10-06 DIAGNOSIS — R1031 Right lower quadrant pain: Secondary | ICD-10-CM

## 2018-10-06 MED ORDER — TAMSULOSIN HCL 0.4 MG PO CAPS: 0.4000 mg | ORAL_CAPSULE | Freq: Every day | ORAL | 0 refills | Status: AC

## 2018-10-06 MED ORDER — GLYBURIDE 2.5 MG PO TABS: 2.5000 mg | ORAL_TABLET | Freq: Every day | ORAL | 2 refills | Status: DC

## 2018-10-06 NOTE — Progress Notes (Signed)
Established Patient Office Visit  Subjective:  Patient ID: [name], female    DOB: February 03, 1966  Age: 52 y.o. MRN: [id number]  CC:  Chief Complaint  Patient presents with  . Follow-up    Lab And Blood Sugar    HPI [name] presents for lab f/u. Still c/o intermittent flank pain that'[place] colicky in nature. UA neg for infection but a1c 7.2.  Past Medical History:  Diagnosis Date  . Abdominal ultrasound, abnormal    normal  . Abnormal abdominal CT scan 01/20/2005   and pelvic CT, normal   . Depressed   . Diabetes mellitus without complication ([place])   . Fatty liver   . History of barium enema    normal  . History of pelvic ultrasound 12/06   normal   . Somatic complaints, multiple     Past Surgical History:  Procedure Laterality Date  . APPENDECTOMY    . caesarean section     x2  . CHOLECYSTECTOMY  2009    Family History  Problem Relation Age of Onset  . Throat cancer Father        non-smoker  . Diabetes Mother   . Hypertension Mother     Social History   Socioeconomic History  . Marital status: Married    Spouse name: Not on file  . Number of children: 3  . Years of education: Not on file  . Highest education level: Not on file  Occupational History  . Not on file  Social Needs  . Financial resource strain: Not on file  . Food insecurity:    Worry: Not on file    Inability: Not on file  . Transportation needs:    Medical: Not on file    Non-medical: Not on file  Tobacco Use  . Smoking status: Never Smoker  . Smokeless tobacco: Never Used  Substance and Sexual Activity  . Alcohol use: No    Alcohol/week: 0.0 standard drinks  . Drug use: No  . Sexual activity: Not Currently  Lifestyle  . Physical activity:    Days per week: Not on file    Minutes per session: Not on file  . Stress: Not on file  Relationships  . Social connections:    Talks on phone: Not on file    Gets together: Not on file    Attends religious service: Not on  file    Active member of club or organization: Not on file    Attends meetings of clubs or organizations: Not on file    Relationship status: Not on file  . Intimate partner violence:    Fear of current or ex partner: Not on file    Emotionally abused: Not on file    Physically abused: Not on file    Forced sexual activity: Not on file  Other Topics Concern  . Not on file  Social History Narrative   Originally from [place]. 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 Lives at home with husband and 3 children.     Outpatient Medications Prior to Visit  Medication Sig Dispense Refill  . atorvastatin (LIPITOR) 20 MG tablet Take 20 mg by mouth daily.    . empagliflozin (JARDIANCE) 10 MG TABS tablet Take 10 mg by mouth daily.    . metFORMIN (GLUCOPHAGE) 1000 MG tablet Take 1 tablet (1,000 mg total) by mouth 2 (two) times daily with a meal. 180 tablet 3  . betamethasone dipropionate (  DIPROLENE) 0.05 % cream Apply topically 2 (two) times daily. (Patient not taking: Reported on 10/06/2018) 30 g 0  . cetirizine (ZYRTEC) 5 MG tablet Take 1 tablet (5 mg total) by mouth daily. (Patient not taking: Reported on 10/06/2018) 30 tablet 1  . dextromethorphan-guaiFENesin (MUCINEX DM) 30-600 MG 12hr tablet Take 1 tablet by mouth 2 (two) times daily. (Patient not taking: Reported on 10/06/2018) 15 tablet 0  . fluticasone (FLONASE) 50 MCG/ACT nasal spray Place 1 spray into both nostrils daily. (Patient not taking: Reported on 10/06/2018) 16 g 2  . meloxicam (MOBIC) 7.5 MG tablet Take 1 tablet (7.5 mg total) by mouth daily. (Patient not taking: Reported on 09/01/2018) 20 tablet 0  . olopatadine (PATANOL) 0.1 % ophthalmic solution Place 1 drop into both eyes 2 (two) times daily. (Patient not taking: Reported on 10/06/2018) 5 mL 12   No facility-administered medications prior to visit.     No Known Allergies  ROS Review of Systems    Objective:    Physical Exam  Constitutional:  She appears well-developed. No distress.  Cardiovascular: Normal rate, regular rhythm and normal heart sounds.  Pulmonary/Chest: Breath sounds normal.  Abdominal: Soft. She exhibits no abdominal bruit. There is tenderness. There is CVA tenderness. There is no rigidity and no guarding.   right cva tendernes  BP (!) 151/88 (BP Location: Right Arm, Patient Position: Sitting, Cuff Size: Normal)   Pulse 80   Temp 97.8 F (36.6 C)   Ht 5\' 4"  (1.626 m)   Wt 139 lb (63 kg)   SpO2 99%   BMI 23.86 kg/m  Wt Readings from Last 3 Encounters:  10/06/18 139 lb (63 kg)  09/01/18 140 lb (63.5 kg)  08/29/18 138 lb (62.6 kg)     Health Maintenance Due  Topic Date Due  . PNEUMOCOCCAL POLYSACCHARIDE VACCINE AGE 25-64 HIGH RISK  04/07/1969  . FOOT EXAM  04/07/1977  . OPHTHALMOLOGY EXAM  04/07/1977  . TETANUS/TDAP  04/07/1986  . URINE MICROALBUMIN  05/06/2015  . COLONOSCOPY  04/07/2017  . INFLUENZA VACCINE  05/31/2018  . MAMMOGRAM  06/20/2018    There are no preventive care reminders to display for this patient.  Lab Results  Component Value Date   TSH 3.510 05/02/2017   Lab Results  Component Value Date   WBC 5.6 04/04/2017   HGB 11.6 04/04/2017   HCT 35.0 04/04/2017   MCV 86 04/04/2017   PLT 258 04/04/2017   Lab Results  Component Value Date   NA 141 04/04/2017   K 4.0 04/04/2017   CO2 25 04/04/2017   GLUCOSE 133 (H) 04/04/2017   BUN 12 04/04/2017   CREATININE 0.78 04/04/2017   BILITOT <0.2 04/04/2017   ALKPHOS 98 04/04/2017   AST 29 04/04/2017   ALT 38 (H) 04/04/2017   PROT 7.6 04/04/2017   ALBUMIN 4.2 04/04/2017   CALCIUM 9.4 04/04/2017   ANIONGAP 10 11/15/2016   No results found for: CHOL No results found for: HDL No results found for: LDLCALC No results found for: TRIG No results found for: CHOLHDL Lab Results  Component Value Date   HGBA1C 7.2 (H) 09/05/2018      Assessment & Plan:   Problem List Items Addressed This Visit    None    Increase Jardiance  to 25 mg but drug assistance program has been ineffective, add glyburide till [place] provided by company. Order CT stone protocol to r/o urolithiasis  No orders of the defined types were placed in this encounter.  Follow-up: No follow-ups on file.  4 wks   Volanda Napoleon, MD

## 2018-11-03 ENCOUNTER — Ambulatory Visit: Payer: Self-pay | Admitting: Family Medicine

## 2018-11-12 ENCOUNTER — Other Ambulatory Visit: Payer: PRIVATE HEALTH INSURANCE

## 2018-12-03 ENCOUNTER — Ambulatory Visit (HOSPITAL_COMMUNITY)
Admission: EM | Admit: 2018-12-03 | Discharge: 2018-12-03 | Disposition: A | Discharge status: Home / Self Care | Payer: PRIVATE HEALTH INSURANCE | Attending: Family Medicine | Admitting: Family Medicine

## 2018-12-03 ENCOUNTER — Encounter (HOSPITAL_COMMUNITY): Payer: Self-pay | Admitting: Emergency Medicine

## 2018-12-03 ENCOUNTER — Other Ambulatory Visit: Payer: Self-pay

## 2018-12-03 DIAGNOSIS — R69 Illness, unspecified: Secondary | ICD-10-CM | POA: Diagnosis not present

## 2018-12-03 DIAGNOSIS — J111 Influenza due to unidentified influenza virus with other respiratory manifestations: Secondary | ICD-10-CM

## 2018-12-03 MED ORDER — OSELTAMIVIR PHOSPHATE 75 MG PO CAPS: 75.0000 mg | ORAL_CAPSULE | Freq: Two times a day (BID) | ORAL | 0 refills | Status: DC

## 2018-12-03 MED ORDER — DM-GUAIFENESIN ER 30-600 MG PO TB12: 1.0000 | ORAL_TABLET | Freq: Two times a day (BID) | ORAL | 0 refills | Status: DC

## 2018-12-03 MED ORDER — ACETAMINOPHEN 500 MG PO TABS: 500.0000 mg | ORAL_TABLET | Freq: Four times a day (QID) | ORAL | 0 refills | Status: DC | PRN

## 2018-12-03 MED ORDER — IBUPROFEN 600 MG PO TABS: 600.0000 mg | ORAL_TABLET | Freq: Four times a day (QID) | ORAL | 0 refills | Status: DC | PRN

## 2018-12-03 NOTE — ED Triage Notes (Signed)
Pt's family reports that the pt has been very hot for the last two days with body aches.  Pt took 500mg  Tylenol today at 0900.

## 2018-12-03 NOTE — Discharge Instructions (Addendum)
This is most likely a flulike illness Tamiflu twice a day for 5 days Mucinex for cough, congestion Tylenol/ibuprofen for body aches and fever Follow up as needed for continued or worsening symptoms

## 2018-12-03 NOTE — ED Provider Notes (Signed)
[place]    CSN: [id number] Arrival date & time: 12/03/18  2703     History   Chief Complaint Chief Complaint  Patient presents with  . Fever  . Generalized Body Aches    HPI [name] is a 53 y.o. female.   Patient is a 53 year old female that presents today with 2 days of flulike symptoms.  She has had fever, myalgias, cough, congestion.  She has been taking Tylenol extra strength for symptoms.  Denies any recent traveling or sick contacts.  No nausea, vomiting, diarrhea.  ROS per HPI      Past Medical History:  Diagnosis Date  . Abdominal ultrasound, abnormal    normal  . Abnormal abdominal CT scan 01/20/2005   and pelvic CT, normal   . Depressed   . Diabetes mellitus without complication ([place])   . Fatty liver   . History of barium enema    normal  . History of pelvic ultrasound 12/06   normal   . Somatic complaints, multiple     Patient Active Problem List   Diagnosis Date Noted  . Eczema 04/04/2017  . Allergic dermatitis 09/17/2015  . Lactose intolerance 08/13/2015  . Cervical strain 04/07/2015  . Arthralgia 05/05/2014  . Chronic headaches 01/06/2014  . Amenorrhea 03/29/2013  . Diabetes mellitus ([place]) 01/17/2013  . 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 APPENDECTOMY    . caesarean section     x2  . CHOLECYSTECTOMY  2009    OB History    Gravida  4   Para  3   Term  3   Preterm      AB  1   Living  3     SAB      TAB      Ectopic  1   Multiple      Live Births               Home Medications    Prior to Admission medications   Medication Sig Start Date End Date Taking? Authorizing Provider  atorvastatin (LIPITOR) 20 MG tablet Take 20 mg by mouth daily.   Yes [provider]  empagliflozin (JARDIANCE) 10 MG TABS tablet Take 10 mg by mouth daily.   Yes [provider]  metFORMIN (GLUCOPHAGE) 1000 MG tablet  Take 1 tablet (1,000 mg total) by mouth 2 (two) times daily with a meal. 06/07/17  Yes Nicolette Bang, DO  acetaminophen (TYLENOL) 500 MG tablet Take 1 tablet (500 mg total) by mouth every 6 (six) hours as needed. 12/03/18   Loura Halt A, NP  dextromethorphan-guaiFENesin (MUCINEX DM) 30-600 MG 12hr tablet Take 1 tablet by mouth 2 (two) times daily. 12/03/18   Loura Halt A, NP  glyBURIDE (DIABETA) 2.5 MG tablet Take 1 tablet (2.5 mg total) by mouth daily with breakfast. 10/06/18   Jodi Marble, MD  ibuprofen (ADVIL,MOTRIN) 600 MG tablet Take 1 tablet (600 mg total) by mouth every 6 (six) hours as needed. 12/03/18   Loura Halt A, NP  oseltamivir (TAMIFLU) 75 MG capsule Take 1 capsule (75 mg total) by mouth every 12 (twelve) hours. 12/03/18   Orvan July, NP    Family History Family History  Problem Relation Age of Onset  . Throat cancer Father        non-smoker  . Diabetes Mother   . Hypertension Mother     Social History Social  History   Tobacco Use  . Smoking status: Never Smoker  . Smokeless tobacco: Never Used  Substance Use Topics  . Alcohol use: No    Alcohol/week: 0.0 standard drinks  . Drug use: No     Allergies   Patient has no known allergies.   Review of Systems Review of Systems   Physical Exam Triage Vital Signs ED Triage Vitals  Enc Vitals Group     BP 12/03/18 1058 99/61     Pulse Rate 12/03/18 1058 91     Resp 12/03/18 1058 18     Temp 12/03/18 1058 (!) 100.4 F (38 C)     Temp Source 12/03/18 1058 Oral     SpO2 12/03/18 1058 99 %     Weight --      Height --      Head Circumference --      Peak Flow --      Pain Score 12/03/18 1101 10     Pain Loc --      Pain Edu? --      Excl. in [place]? --    No data found.  Updated Vital Signs BP 99/61 (BP Location: Left Arm)   Pulse 91   Temp (!) 100.4 F (38 C) (Oral)   Resp 18   SpO2 99%   Visual Acuity Right Eye Distance:   Left Eye Distance:   Bilateral Distance:    Right Eye  Near:   Left Eye Near:    Bilateral Near:     Physical Exam Vitals signs and nursing note reviewed.  Constitutional:      General: She is not in acute distress.    Appearance: She is well-developed. She is ill-appearing.  HENT:     Head: Normocephalic and atraumatic.     Right Ear: Tympanic membrane and ear canal normal.     Left Ear: Tympanic membrane and ear canal normal.     Nose: Congestion and rhinorrhea present.     Mouth/Throat:     Pharynx: Oropharynx is clear.  Eyes:     Conjunctiva/sclera: Conjunctivae normal.  Neck:     Musculoskeletal: Neck supple.  Cardiovascular:     Rate and Rhythm: Normal rate and regular rhythm.     Heart sounds: No murmur.  Pulmonary:     Effort: Pulmonary effort is normal. No respiratory distress.     Breath sounds: Normal breath sounds.  Abdominal:     Palpations: Abdomen is soft.     Tenderness: There is no abdominal tenderness.  Musculoskeletal: Normal range of motion.  Skin:    General: Skin is warm and dry.  Neurological:     Mental Status: She is alert.  Psychiatric:        Mood and Affect: Mood normal.      UC Treatments / Results  Labs (all labs ordered are listed, but only abnormal results are displayed) Labs Reviewed - No data to display  EKG None  Radiology No results found.  Procedures Procedures (including critical care time)  Medications Ordered in UC Medications - No data to display  Initial Impression / Assessment and Plan / UC Course  I have reviewed the triage vital signs and the nursing notes.  Pertinent labs & imaging results that were available during my care of the patient were reviewed by me and considered in my medical decision making (see chart for details).     Symptoms consistent with flulike illness Tamiflu twice a day for 5 days  Mucinex for cough, congestion Tylenol/ibuprofen for body aches and fever Follow up as needed for continued or worsening symptoms  Final Clinical  Impressions(s) / UC Diagnoses   Final diagnoses:  Influenza-like illness     Discharge Instructions     This is most likely a flulike illness Tamiflu twice a day for 5 days Mucinex for cough, congestion Tylenol/ibuprofen for body aches and fever Follow up as needed for continued or worsening symptoms     ED Prescriptions    Medication Sig Dispense Auth. Provider   dextromethorphan-guaiFENesin (MUCINEX DM) 30-600 MG 12hr tablet Take 1 tablet by mouth 2 (two) times daily. 14 tablet Tomiko Schoon A, NP   oseltamivir (TAMIFLU) 75 MG capsule Take 1 capsule (75 mg total) by mouth every 12 (twelve) hours. 10 capsule Daveda Larock A, NP   ibuprofen (ADVIL,MOTRIN) 600 MG tablet Take 1 tablet (600 mg total) by mouth every 6 (six) hours as needed. 30 tablet Yesenia Locurto A, NP   acetaminophen (TYLENOL) 500 MG tablet Take 1 tablet (500 mg total) by mouth every 6 (six) hours as needed. 30 tablet Loura Halt A, NP     Controlled Substance Prescriptions [place] Controlled Substance Registry consulted? Not Applicable   Orvan July, NP 12/03/18 1143

## 2018-12-05 ENCOUNTER — Telehealth (HOSPITAL_COMMUNITY): Payer: Self-pay | Admitting: Emergency Medicine

## 2018-12-05 MED ORDER — OSELTAMIVIR PHOSPHATE 6 MG/ML PO SUSR: 75.0000 mg | Freq: Two times a day (BID) | ORAL | 0 refills | Status: AC

## 2018-12-05 NOTE — Telephone Encounter (Signed)
Pt family came in stating that pt is unable to take tamiflu capsules due to gelatin; liquid sent to pharmacy

## 2018-12-26 ENCOUNTER — Encounter (HOSPITAL_COMMUNITY): Payer: Self-pay

## 2018-12-26 ENCOUNTER — Other Ambulatory Visit: Payer: Self-pay

## 2018-12-26 ENCOUNTER — Ambulatory Visit (INDEPENDENT_AMBULATORY_CARE_PROVIDER_SITE_OTHER): Payer: PRIVATE HEALTH INSURANCE

## 2018-12-26 ENCOUNTER — Ambulatory Visit (HOSPITAL_COMMUNITY)
Admission: EM | Admit: 2018-12-26 | Discharge: 2018-12-26 | Disposition: A | Discharge status: Home / Self Care | Payer: PRIVATE HEALTH INSURANCE | Attending: Family Medicine | Admitting: Family Medicine

## 2018-12-26 DIAGNOSIS — R05 Cough: Secondary | ICD-10-CM

## 2018-12-26 DIAGNOSIS — R059 Cough, unspecified: Secondary | ICD-10-CM

## 2018-12-26 MED ORDER — PREDNISONE 10 MG PO TABS: 20.0000 mg | ORAL_TABLET | Freq: Every day | ORAL | 0 refills | Status: DC

## 2018-12-26 NOTE — ED Triage Notes (Signed)
Pt presents to [facility] for cough x3 weeks, rt shoulder and mid chest pain x3 days, pt states the pain increases while sitting. Pt also complains of light headache.

## 2018-12-26 NOTE — ED Provider Notes (Signed)
[place]    CSN: [id number] Arrival date & time: 12/26/18  1715     History   Chief Complaint Chief Complaint  Patient presents with  . Cough  . Shoulder Pain  . Chest Pain    HPI [name] is a 53 y.o. female.   Patient has a 3-week history of cough which is nonproductive.  She denies any fever chills or history of asthma or reflux. Shoulder pain is over the lateral aspect but also has right scapular pain again without injury or trauma.  HPI  Past Medical History:  Diagnosis Date  . Abdominal ultrasound, abnormal    normal  . Abnormal abdominal CT scan 01/20/2005   and pelvic CT, normal   . Depressed   . Diabetes mellitus without complication ([place])   . Fatty liver   . History of barium enema    normal  . History of pelvic ultrasound 12/06   normal   . Somatic complaints, multiple     Patient Active Problem List   Diagnosis Date Noted  . Eczema 04/04/2017  . Allergic dermatitis 09/17/2015  . Lactose intolerance 08/13/2015  . Cervical strain 04/07/2015  . Arthralgia 05/05/2014  . Chronic headaches 01/06/2014  . Amenorrhea 03/29/2013  . Diabetes mellitus ([place]) 01/17/2013  . 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 APPENDECTOMY    . caesarean section     x2  . CHOLECYSTECTOMY  2009    OB History    Gravida  4   Para  3   Term  3   Preterm      AB  1   Living  3     SAB      TAB      Ectopic  1   Multiple      Live Births               Home Medications    Prior to Admission medications   Medication Sig Start Date End Date Taking? Authorizing Provider  atorvastatin (LIPITOR) 20 MG tablet Take 20 mg by mouth daily.   Yes [provider]  empagliflozin (JARDIANCE) 10 MG TABS tablet Take 10 mg by mouth daily.   Yes [provider]  metFORMIN (GLUCOPHAGE) 1000 MG tablet Take 1 tablet (1,000 mg total) by mouth 2 (two) times  daily with a meal. 06/07/17  Yes Nicolette Bang, DO  rosuvastatin (CRESTOR) 10 MG tablet Take 10 mg by mouth daily. 11/09/18  Yes [provider]  acetaminophen (TYLENOL) 500 MG tablet Take 1 tablet (500 mg total) by mouth every 6 (six) hours as needed. 12/03/18   Loura Halt A, NP  dextromethorphan-guaiFENesin (MUCINEX DM) 30-600 MG 12hr tablet Take 1 tablet by mouth 2 (two) times daily. 12/03/18   Loura Halt A, NP  glyBURIDE (DIABETA) 2.5 MG tablet Take 1 tablet (2.5 mg total) by mouth daily with breakfast. 10/06/18   Jodi Marble, MD  ibuprofen (ADVIL,MOTRIN) 600 MG tablet Take 1 tablet (600 mg total) by mouth every 6 (six) hours as needed. 12/03/18   Loura Halt A, NP  traMADol (ULTRAM) 50 MG tablet TAKE 1 TABLET BY MOUTH TWICE DAILY AS NEEDED FOR 5 DAYS 11/07/18   [provider]    Family History Family History  Problem Relation Age of Onset  . Throat cancer Father        non-smoker  . Diabetes Mother   .  Hypertension Mother     Social History Social History   Tobacco Use  . Smoking status: Never Smoker  . Smokeless tobacco: Never Used  Substance Use Topics  . Alcohol use: No    Alcohol/week: 0.0 standard drinks  . Drug use: No     Allergies   Patient has no known allergies.   Review of Systems Review of Systems  Constitutional: Negative.   Respiratory: Positive for cough.   Musculoskeletal: Positive for arthralgias.     Physical Exam Triage Vital Signs ED Triage Vitals  Enc Vitals Group     BP 12/26/18 1812 (!) 145/60     Pulse Rate 12/26/18 1812 79     Resp 12/26/18 1812 17     Temp 12/26/18 1812 97.6 F (36.4 C)     Temp Source 12/26/18 1812 Oral     SpO2 12/26/18 1812 100 %     Weight --      Height --      Head Circumference --      Peak Flow --      Pain Score 12/26/18 1813 10     Pain Loc --      Pain Edu? --      Excl. in [place]? --    No data found.  Updated Vital Signs BP (!) 145/60 (BP Location: Left Arm)   Pulse  79   Temp 97.6 F (36.4 C) (Oral)   Resp 17   SpO2 100%   Visual Acuity Right Eye Distance:   Left Eye Distance:   Bilateral Distance:    Right Eye Near:   Left Eye Near:    Bilateral Near:     Physical Exam Constitutional:      Appearance: She is well-developed and normal weight.  HENT:     Head: Normocephalic.  Eyes:     Pupils: Pupils are equal, round, and reactive to light.  Cardiovascular:     Rate and Rhythm: Normal rate and regular rhythm.     Heart sounds: Normal heart sounds.  Pulmonary:     Effort: Pulmonary effort is normal.     Breath sounds: Normal breath sounds.  Musculoskeletal:     Comments: Right shoulder unable to abduct there is tenderness over the deltoid tendon but maximal tenderness is over the inner aspect of the right scapula  Neurological:     Mental Status: She is alert.      UC Treatments / Results  Labs (all labs ordered are listed, but only abnormal results are displayed) Labs Reviewed - No data to display  EKG None  Radiology No results found.  Procedures Procedures (including critical care time)  Medications Ordered in UC Medications - No data to display  Initial Impression / Assessment and Plan / UC Course  I have reviewed the triage vital signs and the nursing notes.  Pertinent labs & imaging results that were available during my care of the patient were reviewed by me and considered in my medical decision making (see chart for details).     Cough. There is no evidence of bronchitis or pneumonia.  I suspect she has post respiratory syndrome and would benefit from some steroids which will also likely help tendinitis in her right scapula Final Clinical Impressions(s) / UC Diagnoses   Final diagnoses:  None   Discharge Instructions   None    ED Prescriptions    None     Controlled Substance Prescriptions [place] Controlled Substance Registry consulted? No  Wardell Honour, MD 12/26/18 [phone]

## 2018-12-28 ENCOUNTER — Telehealth: Payer: Self-pay

## 2018-12-28 ENCOUNTER — Telehealth: Payer: Self-pay | Admitting: Family Medicine

## 2018-12-28 NOTE — Telephone Encounter (Signed)
Nursing- please call patient to schedule appointment to discuss diabetes and recent ED visit.   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   Shelly Singh, MD  Family Medicine Teaching Service

## 2018-12-28 NOTE — Telephone Encounter (Signed)
Attempted to call patient to schedule and appointment.  There was no answer and no voice mail.  [name], [place]

## 2018-12-31 NOTE — Telephone Encounter (Signed)
Pt scheduled to see dr Maudie Mercury. Ermal Brzozowski Kennon Holter, CMA

## 2019-01-01 ENCOUNTER — Encounter (HOSPITAL_COMMUNITY): Payer: Self-pay | Admitting: Emergency Medicine

## 2019-01-01 ENCOUNTER — Emergency Department (HOSPITAL_COMMUNITY): Payer: PRIVATE HEALTH INSURANCE

## 2019-01-01 ENCOUNTER — Emergency Department (HOSPITAL_COMMUNITY)
Admission: EM | Admit: 2019-01-01 | Discharge: 2019-01-01 | Disposition: A | Discharge status: Home / Self Care | Payer: PRIVATE HEALTH INSURANCE | Attending: Emergency Medicine | Admitting: Emergency Medicine

## 2019-01-01 ENCOUNTER — Other Ambulatory Visit: Payer: Self-pay

## 2019-01-01 DIAGNOSIS — Y939 Activity, unspecified: Secondary | ICD-10-CM | POA: Diagnosis not present

## 2019-01-01 DIAGNOSIS — Y929 Unspecified place or not applicable: Secondary | ICD-10-CM | POA: Diagnosis not present

## 2019-01-01 DIAGNOSIS — E119 Type 2 diabetes mellitus without complications: Secondary | ICD-10-CM | POA: Diagnosis not present

## 2019-01-01 DIAGNOSIS — X509XXA Other and unspecified overexertion or strenuous movements or postures, initial encounter: Secondary | ICD-10-CM | POA: Diagnosis not present

## 2019-01-01 DIAGNOSIS — S4991XA Unspecified injury of right shoulder and upper arm, initial encounter: Secondary | ICD-10-CM | POA: Diagnosis present

## 2019-01-01 DIAGNOSIS — Z79899 Other long term (current) drug therapy: Secondary | ICD-10-CM | POA: Diagnosis not present

## 2019-01-01 DIAGNOSIS — Y999 Unspecified external cause status: Secondary | ICD-10-CM | POA: Insufficient documentation

## 2019-01-01 DIAGNOSIS — S46811A Strain of other muscles, fascia and tendons at shoulder and upper arm level, right arm, initial encounter: Secondary | ICD-10-CM | POA: Insufficient documentation

## 2019-01-01 DIAGNOSIS — Z7984 Long term (current) use of oral hypoglycemic drugs: Secondary | ICD-10-CM | POA: Diagnosis not present

## 2019-01-01 LAB — BASIC METABOLIC PANEL
Anion gap: 5 (ref 5–15)
BUN: 11 mg/dL (ref 6–20)
CHLORIDE: 105 mmol/L (ref 98–111)
CO2: 28 mmol/L (ref 22–32)
CREATININE: 0.74 mg/dL (ref 0.44–1.00)
Calcium: 9.8 mg/dL (ref 8.9–10.3)
GFR calc Af Amer: >60 mL/min (ref >60)
GFR calc non Af Amer: >60 mL/min (ref >60)
Glucose, Bld: 94 mg/dL (ref 70–99)
Potassium: 3.7 mmol/L (ref 3.5–5.1)
Sodium: 138 mmol/L (ref 135–145)

## 2019-01-01 LAB — CBC
HEMATOCRIT: 38.3 % (ref 36.0–46.0)
Hemoglobin: 12 g/dL (ref 12.0–15.0)
MCH: 28 pg (ref 26.0–34.0)
MCHC: 31.3 g/dL (ref 30.0–36.0)
MCV: 89.3 fL (ref 80.0–100.0)
Platelets: 234 10*3/uL (ref 150–400)
RBC: 4.29 MIL/uL (ref 3.87–5.11)
RDW: 13.2 % (ref 11.5–15.5)
WBC: 6.5 10*3/uL (ref 4.0–10.5)
nRBC: 0 % (ref 0.0–0.2)

## 2019-01-01 LAB — I-STAT BETA HCG BLOOD, ED (MC, WL, AP ONLY): I-stat hCG, quantitative: <5 m[IU]/mL (ref <5)

## 2019-01-01 LAB — I-STAT TROPONIN, ED: Troponin i, poc: 0 ng/mL (ref 0.00–0.08)

## 2019-01-01 MED ORDER — OXYCODONE HCL 5 MG PO TABS
5.0000 mg | ORAL_TABLET | Freq: Once | ORAL | Status: AC
  Administered 2019-01-01: 5 mg via ORAL
  Filled 2019-01-01: qty 1

## 2019-01-01 MED ORDER — KETOROLAC TROMETHAMINE 60 MG/2ML IM SOLN
15.0000 mg | Freq: Once | INTRAMUSCULAR | Status: AC
  Administered 2019-01-01: 15 mg via INTRAMUSCULAR
  Filled 2019-01-01: qty 2

## 2019-01-01 MED ORDER — ACETAMINOPHEN 500 MG PO TABS
1000.0000 mg | ORAL_TABLET | Freq: Once | ORAL | Status: AC
  Administered 2019-01-01: 1000 mg via ORAL
  Filled 2019-01-01: qty 2

## 2019-01-01 NOTE — ED Notes (Signed)
Pt unable to sign esig discharge paper due to right hand hurting. veralized understanding. Patient verbalizes understanding of discharge instructions. Opportunity for questioning and answers were provided. Armband removed by staff, pt discharged from ED ambulatory

## 2019-01-01 NOTE — ED Triage Notes (Signed)
Pt in with multiple complaints. Has had R shoulder pain >1wk, cough x 3 wks and chest pain x 1 wk ago. States she went to UC, but meds prescribed are not helping. CP worse when palpated, denies n/v

## 2019-01-01 NOTE — Discharge Instructions (Signed)
Take 4 over the counter ibuprofen tablets 3 times a day or 2 over-the-counter naproxen tablets twice a day for pain. Also take tylenol 1000mg (2 extra strength) four times a day.   Take your shoulder out of the sling at least 4x a day and perform range of motion exercises.

## 2019-01-01 NOTE — ED Provider Notes (Signed)
[facility] EM[facility] DEPARTMENT Provider Note   CSN: [id number] Arrival date & time: 01/01/19  1624    History   Chief Complaint Chief Complaint  Patient presents with  . Shoulder Pain  . Chest Pain  . Cough    HPI [name] is a 53 y.o. female.     52 yo F with a chief complaint of right shoulder pain.  This been going on for the past week.  This was preceded by an upper respiratory illness cough congestion fevers.  Fevers has resolved and now off and on she feels like maybe she is hot and then cold.  She denies shortness of breath.  Pain is worse with coughing movement and palpation.  Usually worse first thing in the morning when she wakes up.  She denies history of PE or DVT denies history of cancer denies hemoptysis denies unilateral lower extremity edema denies recent surgery or immobilization.  The history is provided by the patient.  Shoulder Pain  Associated symptoms: no fever   Chest Pain  Associated symptoms: cough and shortness of breath   Associated symptoms: no dizziness, no fever, no headache, no nausea, no palpitations and no vomiting   Cough  Associated symptoms: chest pain and shortness of breath   Associated symptoms: no chills, no fever, no headaches, no myalgias, no rhinorrhea and no wheezing   Illness  Severity:  Mild Onset quality:  Gradual Duration:  1 week Timing:  Constant Progression:  Unchanged Chronicity:  New Associated symptoms: chest pain, cough and shortness of breath   Associated symptoms: no congestion, no fever, no headaches, no myalgias, no nausea, no rhinorrhea, no vomiting and no wheezing     Past Medical History:  Diagnosis Date  . Abdominal ultrasound, abnormal    normal  . Abnormal abdominal CT scan 01/20/2005   and pelvic CT, normal   . Depressed   . Diabetes mellitus without complication ([place])   . Fatty liver   . History of barium enema    normal  . History of pelvic ultrasound 12/06   normal     . Somatic complaints, multiple     Patient Active Problem List   Diagnosis Date Noted  . Eczema 04/04/2017  . Allergic dermatitis 09/17/2015  . Lactose intolerance 08/13/2015  . Cervical strain 04/07/2015  . Arthralgia 05/05/2014  . Chronic headaches 01/06/2014  . Amenorrhea 03/29/2013  . Diabetes mellitus ([place]) 01/17/2013  . 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 APPENDECTOMY    . caesarean section     x2  . CHOLECYSTECTOMY  2009     OB History    Gravida  4   Para  3   Term  3   Preterm      AB  1   Living  3     SAB      TAB      Ectopic  1   Multiple      Live Births               Home Medications    Prior to Admission medications   Medication Sig Start Date End Date Taking? Authorizing Provider  acetaminophen (TYLENOL) 500 MG tablet Take 1 tablet (500 mg total) by mouth every 6 (six) hours as needed. 12/03/18   Loura Halt A, NP  atorvastatin (LIPITOR) 20 MG tablet Take 20 mg by mouth daily.    [provider]  dextromethorphan-guaiFENesin (MUCINEX DM) 30-600 MG 12hr tablet Take 1 tablet by mouth 2 (two) times daily. 12/03/18   Loura Halt A, NP  empagliflozin (JARDIANCE) 10 MG TABS tablet Take 10 mg by mouth daily.    [provider]  glyBURIDE (DIABETA) 2.5 MG tablet Take 1 tablet (2.5 mg total) by mouth daily with breakfast. 10/06/18   Jodi Marble, MD  ibuprofen (ADVIL,MOTRIN) 600 MG tablet Take 1 tablet (600 mg total) by mouth every 6 (six) hours as needed. 12/03/18   Loura Halt A, NP  metFORMIN (GLUCOPHAGE) 1000 MG tablet Take 1 tablet (1,000 mg total) by mouth 2 (two) times daily with a meal. 06/07/17   Nicolette Bang, DO  predniSONE (DELTASONE) 10 MG tablet Take 2 tablets (20 mg total) by mouth daily. 12/26/18   Wardell Honour, MD  rosuvastatin (CRESTOR) 10 MG tablet Take 10 mg by mouth daily. 11/09/18   [provider]   traMADol (ULTRAM) 50 MG tablet TAKE 1 TABLET BY MOUTH TWICE DAILY AS NEEDED FOR 5 DAYS 11/07/18   [provider]    Family History Family History  Problem Relation Age of Onset  . Throat cancer Father        non-smoker  . Diabetes Mother   . Hypertension Mother     Social History Social History   Tobacco Use  . Smoking status: Never Smoker  . Smokeless tobacco: Never Used  Substance Use Topics  . Alcohol use: No    Alcohol/week: 0.0 standard drinks  . Drug use: No     Allergies   Patient has no known allergies.   Review of Systems Review of Systems  Constitutional: Negative for chills and fever.  HENT: Negative for congestion and rhinorrhea.   Eyes: Negative for redness and visual disturbance.  Respiratory: Positive for cough and shortness of breath. Negative for wheezing.   Cardiovascular: Positive for chest pain. Negative for palpitations.  Gastrointestinal: Negative for nausea and vomiting.  Genitourinary: Negative for dysuria and urgency.  Musculoskeletal: Negative for arthralgias and myalgias.  Skin: Negative for pallor and wound.  Neurological: Negative for dizziness and headaches.     Physical Exam Updated Vital Signs BP (!) 161/109   Pulse 89   Temp 97.7 F (36.5 C) (Oral)   Resp 18   Wt 63.4 kg   SpO2 100%   BMI 23.99 kg/m   Physical Exam Vitals signs and nursing note reviewed.  Constitutional:      General: She is not in acute distress.    Appearance: She is well-developed. She is not diaphoretic.  HENT:     Head: Normocephalic and atraumatic.  Eyes:     Pupils: Pupils are equal, round, and reactive to light.  Neck:     Musculoskeletal: Normal range of motion and neck supple.  Cardiovascular:     Rate and Rhythm: Normal rate and regular rhythm.     Heart sounds: No murmur. No friction rub. No gallop.   Pulmonary:     Effort: Pulmonary effort is normal.     Breath sounds: No wheezing or rales.  Abdominal:     General: There  is no distension.     Palpations: Abdomen is soft.     Tenderness: There is no abdominal tenderness.  Musculoskeletal:     Left lower leg: She exhibits tenderness.     Comments: Tenderness about the muscle belly of the right trapezius.  Reproduces the patient's pain.  Has some pain also extends  into the right upper chest wall.  Pain is also reproduced with range of motion of the right shoulder.  Pulse motor and sensation is intact to the right upper extremity.  Skin:    General: Skin is warm and dry.  Neurological:     Mental Status: She is alert and oriented to person, place, and time.  Psychiatric:        Behavior: Behavior normal.      ED Treatments / Results  Labs (all labs ordered are listed, but only abnormal results are displayed) Labs Reviewed  BASIC METABOLIC PANEL  CBC  I-STAT TROPONIN, ED  I-STAT BETA HCG BLOOD, ED ([facility], [facility], [facility] ONLY)    EKG EKG Interpretation  Date/Time:  Tuesday January 01 2019 16:33:09 EST Ventricular Rate:  89 PR Interval:  104 QRS Duration: 90 QT Interval:  354 QTC Calculation: 430 R Axis:   46 Text Interpretation:  Sinus rhythm with short PR Nonspecific T wave abnormality Abnormal ECG No significant change since last tracing Confirmed by Deno Etienne [phone]) on 01/01/2019 11:00:09 PM   Radiology Dg Chest 2 View  Result Date: 01/01/2019 CLINICAL DATA:  Upper chest pain radiating to right side posteriorly and down right arm. Symptoms 1 week. Dry cough. EXAM: CHEST - 2 VIEW COMPARISON:  12/26/2018 FINDINGS: Lungs are somewhat hypoinflated without consolidation or effusion. Cardiomediastinal silhouette is within normal. Foreign body material over the soft tissues of the right flank likely external to the patient as this is not seen on the lateral film. Remainder of the exam is unchanged. IMPRESSION: No active cardiopulmonary disease. Electronically Signed   By: Marin Olp M.D.   On: 01/01/2019 18:03    Procedures Procedures (including critical  care time)  Medications Ordered in ED Medications  acetaminophen (TYLENOL) tablet 1,000 mg (has no administration in time range)  ketorolac (TORADOL) injection 15 mg (has no administration in time range)  oxyCODONE (Oxy IR/ROXICODONE) immediate release tablet 5 mg (has no administration in time range)     Initial Impression / Assessment and Plan / ED Course  I have reviewed the triage vital signs and the nursing notes.  Pertinent labs & imaging results that were available during my care of the patient were reviewed by me and considered in my medical decision making (see chart for details).        53 yo F with a chief complaint of right-sided chest pain.  Worse with coughing moving palpation.  Reproduced on my exam.  Going on for at least a week.  She had a troponin that was performed in triage that was negative chest x-ray viewed by me without focal infiltrate or pneumothorax.  At this point is most likely a muscular strain, so atypical of ACS do not feel the troponin needs to be repeated.  Discharge home.  11:18 PM:  I have discussed the diagnosis/risks/treatment options with the patient and family and believe the pt to be eligible for discharge home to follow-up with PCP. We also discussed returning to the ED immediately if new or worsening sx occur. We discussed the sx which are most concerning (e.g., sudden worsening pain, fever, inability to tolerate by mouth) that necessitate immediate return. Medications administered to the patient during their visit and any new prescriptions provided to the patient are listed below.  Medications given during this visit Medications  acetaminophen (TYLENOL) tablet 1,000 mg (has no administration in time range)  ketorolac (TORADOL) injection 15 mg (has no administration in time range)  oxyCODONE (Oxy  IR/ROXICODONE) immediate release tablet 5 mg (has no administration in time range)     The patient appears reasonably screen and/or stabilized for  discharge and I doubt any other medical condition or other [facility] requiring further screening, evaluation, or treatment in the ED at this time prior to discharge.    Final Clinical Impressions(s) / ED Diagnoses   Final diagnoses:  Trapezius strain, right, initial encounter    ED Discharge Orders    None       Deno Etienne, DO 01/01/19 2318

## 2019-01-01 NOTE — Progress Notes (Signed)
Orthopedic Tech Progress Note Patient Details:  [name] 07-Mar-1966 [id number]  Ortho Devices Type of Ortho Device: Arm sling Ortho Device/Splint Location: rue Ortho Device/Splint Interventions: Ordered, Application, Adjustment   Post Interventions Patient Tolerated: Well Instructions Provided: Care of device, Adjustment of device   Karolee Stamps 01/01/2019, 11:51 PM

## 2019-01-03 ENCOUNTER — Ambulatory Visit: Payer: PRIVATE HEALTH INSURANCE | Admitting: Family Medicine

## 2019-01-04 ENCOUNTER — Telehealth: Payer: Self-pay

## 2019-01-04 NOTE — Telephone Encounter (Signed)
Notes on file, referral sent to scheduling.  

## 2019-01-10 ENCOUNTER — Encounter: Payer: Self-pay | Admitting: Internal Medicine

## 2019-01-10 ENCOUNTER — Other Ambulatory Visit: Payer: Self-pay

## 2019-01-10 ENCOUNTER — Ambulatory Visit (INDEPENDENT_AMBULATORY_CARE_PROVIDER_SITE_OTHER): Payer: PRIVATE HEALTH INSURANCE | Admitting: Internal Medicine

## 2019-01-10 VITALS — BP 130/80 | HR 82 | Ht 64.0 in | Wt 135.0 lb

## 2019-01-10 DIAGNOSIS — I456 Pre-excitation syndrome: Secondary | ICD-10-CM | POA: Diagnosis not present

## 2019-01-10 NOTE — Patient Instructions (Signed)
Medication Instructions:  No changes  If you need a refill on your cardiac medications before your next appointment, please call your pharmacy.   Lab work: none If you have labs (blood work) drawn today and your tests are completely normal, you will receive your results only by: . MyChart Message (if you have MyChart) OR . A paper copy in the mail If you have any lab test that is abnormal or we need to change your treatment, we will call you to review the results.  Testing/Procedures: none  Follow-Up: At [facility], you and your health needs are our priority.  As part of our continuing mission to provide you with exceptional heart care, we have created designated Provider Care Teams.  These Care Teams include your primary Cardiologist (physician) and Advanced Practice Providers (APPs -  Physician Assistants and Nurse Practitioners) who all work together to provide you with the care you need, when you need it. You will need a follow up appointment in 12 months.  Please call our office 2 months in advance to schedule this appointment.  You may see Gregg Taylor, MD or one of the following Advanced Practice Providers on your designated Care Team:   Amber Seiler, NP . Renee Ursuy, PA-C  Any Other Special Instructions Will Be Listed Below (If Applicable).     

## 2019-01-10 NOTE — Progress Notes (Signed)
HPI [name] returns today for followup. 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 When I saw her last, she was having some palpitations and I prescribed flecainide and a beta blocker. She did not have these filled but her symptoms improved. She has not had syncope, sustained palpitations or dizziness. She has fleeting episodes of sob.  No Known Allergies   Current Outpatient Medications  Medication Sig Dispense Refill  . acetaminophen (TYLENOL) 500 MG tablet Take 1 tablet (500 mg total) by mouth every 6 (six) hours as needed. 30 tablet 0  . atorvastatin (LIPITOR) 20 MG tablet Take 20 mg by mouth daily.    . empagliflozin (JARDIANCE) 10 MG TABS tablet Take 10 mg by mouth daily.    Marland Kitchen ibuprofen (ADVIL,MOTRIN) 600 MG tablet Take 1 tablet (600 mg total) by mouth every 6 (six) hours as needed. 30 tablet 0  . metFORMIN (GLUCOPHAGE) 1000 MG tablet Take 1 tablet (1,000 mg total) by mouth 2 (two) times daily with a meal. 180 tablet 3   No current facility-administered medications for this visit.      Past Medical History:  Diagnosis Date  . Abdominal pain, epigastric 06/11/2015  . Abdominal ultrasound, abnormal    normal  . Abnormal abdominal CT scan 01/20/2005   and pelvic CT, normal   . Allergic dermatitis 09/17/2015  . ALLERGIC RHINITIS WITH CONJUNCTIVITIS 06/30/2009   Qualifier: Diagnosis of  By: Amil Amen MD, Benjamine Mola    . Amenorrhea 03/29/2013  . Anxiety with somatization 03/12/2013  . Arthralgia 05/05/2014  . Cervical strain 04/07/2015  . Chronic headaches 01/06/2014  . Dandruff 01/06/2014  . Depressed   . Diabetes mellitus ([place]) 01/17/2013  . Diabetes mellitus without complication ([place])   . Dysuria 12/19/2012  . Eczema 04/04/2017  . Fatigue 04/04/2017  . Fatty liver   . History DEPRESSION 03/10/2007   Qualifier: Diagnosis of  By: Berta Minor tech, Anderson Malta    . History of barium enema    normal  . History of pelvic ultrasound  12/06   normal   . Lactose intolerance 08/13/2015  . Left axillary pain 02/12/2013  . Left genital labial abscess 06/05/2014  . Lower abdominal pain 12/07/2012  . MASTALGIA 03/10/2007   Qualifier: Diagnosis of  By: Berta Minor tech, Anderson Malta    . Pain, dental 12/08/2016  . Right upper quadrant abdominal pain 01/17/2013  . Shoulder pain, left 09/04/2012  . Sinus congestion 03/12/2013  . Sinusitis, chronic 09/30/2013  . Somatic complaints, multiple   . SYMPTOM, PAIN, ABDOMINAL, OTH SPECIFIED SITE 01/10/2005   Qualifier: Diagnosis of  By: Berta Minor tech, Anderson Malta    . 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 Lovena Le at [facility]. Last visit was 2012. Instructed that if pt develops palpitations, then catheter ablation would be recommended.       ROS:   All systems reviewed and negative except as noted in the HPI.   Past Surgical History:  Procedure Laterality Date  . APPENDECTOMY    . caesarean section     x2  . CHOLECYSTECTOMY  2009     Family History  Problem Relation Age of Onset  . Throat cancer Father        non-smoker  . Diabetes Mother   . Hypertension Mother      Social History   Socioeconomic History  . Marital status: Married    Spouse name: Not on file  .  Number of children: 3  . Years of education: Not on file  . Highest education level: Not on file  Occupational History  . Not on file  Social Needs  . Financial resource strain: Not on file  . Food insecurity:    Worry: Not on file    Inability: Not on file  . Transportation needs:    Medical: Not on file    Non-medical: Not on file  Tobacco Use  . Smoking status: Never Smoker  . Smokeless tobacco: Never Used  Substance and Sexual Activity  . Alcohol use: No    Alcohol/week: 0.0 standard drinks  . Drug use: No  . Sexual activity: Not Currently  Lifestyle  . Physical activity:    Days per week: Not on file    Minutes per session: Not on file  . Stress: Not on  file  Relationships  . Social connections:    Talks on phone: Not on file    Gets together: Not on file    Attends religious service: Not on file    Active member of club or organization: Not on file    Attends meetings of clubs or organizations: Not on file    Relationship status: Not on file  . Intimate partner violence:    Fear of current or ex partner: Not on file    Emotionally abused: Not on file    Physically abused: Not on file    Forced sexual activity: Not on file  Other Topics Concern  . Not on file  Social History Narrative   Originally from [place]. 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 Lives at home with husband and 3 children.      BP 130/80   Pulse 82   Ht 5\' 4"  (1.626 m)   Wt 135 lb (61.2 kg)   SpO2 99%   BMI 23.17 kg/m   Physical Exam:  Well appearing NAD HEENT: Unremarkable Neck:  No JVD, no thyromegally Lymphatics:  No adenopathy Back:  No CVA tenderness Lungs:  Clear with no wheezes HEART:  Regular rate rhythm, no murmurs, no rubs, no clicks Abd:  soft, positive bowel sounds, no organomegally, no rebound, no guarding Ext:  2 plus pulses, no edema, no cyanosis, no clubbing Skin:  No rashes no nodules Neuro:  CN II through XII intact, motor grossly intact  EKG - NSR with ventricular pre-excitation  Assess/Plan: 1. WPW syndrome - her symptoms are controlled at this point. I have discussed the treatment options. I have recommended watchful waiting. If she develops more symptoms then starting flecainide and a beta blocker would be very reasonable. We discussed catheter ablation in detail. Her 52 year old daugther assisted. I strongly suspect that the patient's AP is located very close to her AV node/His bundle region and the risk of catheter ablation resulting in CHB is high. For now she will be followed and she is instructed to call us if her symptoms return.  I spent over 25 minutes including 50% face to face time  with the patient. Mikle Bosworth.D.

## 2019-01-14 ENCOUNTER — Ambulatory Visit: Payer: PRIVATE HEALTH INSURANCE | Admitting: Family Medicine

## 2019-06-18 ENCOUNTER — Encounter (HOSPITAL_COMMUNITY): Payer: Self-pay

## 2019-06-18 ENCOUNTER — Telehealth (HOSPITAL_COMMUNITY): Payer: Self-pay | Admitting: Emergency Medicine

## 2019-06-18 ENCOUNTER — Other Ambulatory Visit: Payer: Self-pay

## 2019-06-18 ENCOUNTER — Ambulatory Visit (HOSPITAL_COMMUNITY)
Admission: EM | Admit: 2019-06-18 | Discharge: 2019-06-18 | Disposition: A | Discharge status: Home / Self Care | Payer: PRIVATE HEALTH INSURANCE | Attending: Family Medicine | Admitting: Family Medicine

## 2019-06-18 DIAGNOSIS — I456 Pre-excitation syndrome: Secondary | ICD-10-CM | POA: Insufficient documentation

## 2019-06-18 DIAGNOSIS — Z7984 Long term (current) use of oral hypoglycemic drugs: Secondary | ICD-10-CM | POA: Diagnosis not present

## 2019-06-18 DIAGNOSIS — E119 Type 2 diabetes mellitus without complications: Secondary | ICD-10-CM | POA: Diagnosis not present

## 2019-06-18 DIAGNOSIS — Z20828 Contact with and (suspected) exposure to other viral communicable diseases: Secondary | ICD-10-CM | POA: Diagnosis not present

## 2019-06-18 DIAGNOSIS — J029 Acute pharyngitis, unspecified: Secondary | ICD-10-CM | POA: Diagnosis not present

## 2019-06-18 DIAGNOSIS — Z79899 Other long term (current) drug therapy: Secondary | ICD-10-CM | POA: Insufficient documentation

## 2019-06-18 LAB — POCT RAPID STREP A: Streptococcus, Group A Screen (Direct): NEGATIVE

## 2019-06-18 MED ORDER — CETIRIZINE HCL 10 MG PO CAPS: 10.0000 mg | ORAL_CAPSULE | Freq: Every day | ORAL | 0 refills | Status: DC

## 2019-06-18 MED ORDER — PSEUDOEPH-BROMPHEN-DM 30-2-10 MG/5ML PO SYRP: 5.0000 mL | ORAL_SOLUTION | Freq: Four times a day (QID) | ORAL | 0 refills | Status: DC | PRN

## 2019-06-18 MED ORDER — BENZONATATE 200 MG PO CAPS: 200.0000 mg | ORAL_CAPSULE | Freq: Three times a day (TID) | ORAL | 0 refills | Status: AC | PRN

## 2019-06-18 MED ORDER — IBUPROFEN 600 MG PO TABS: 600.0000 mg | ORAL_TABLET | Freq: Three times a day (TID) | ORAL | 0 refills | Status: DC | PRN

## 2019-06-18 NOTE — ED Triage Notes (Signed)
Patient presents to Urgent Care with complaints of throat pain and dryness since five days. Also feeling feverish and chills, feeling hot.  Patient reports ibuprofen helps.

## 2019-06-18 NOTE — Telephone Encounter (Signed)
Pt family called and needs new RX for cough due to inability to consume gelatin; HW sent in new RX

## 2019-06-18 NOTE — ED Provider Notes (Signed)
[facility]    CSN: [id number] Arrival date & time: 06/18/19  1502      History   Chief Complaint Chief Complaint  Patient presents with  . Sore Throat    HPI [name] is a 53 y.o. female history of WPW, DM type II, presenting today for evaluation of sore throat.  Patient states that beginning Friday over the past 4 days she has had a sore throat.  She has had some occasional cough, but this has been mild.  Denies associated congestion or rhinorrhea.  Has had subjective fevers, but no measured fevers.  Has had hot and cold chills.  Denies known exposure to COVID.  Denies close sick contacts.  Denies chest pain or shortness of breath.  She has taken some ibuprofen which has provided mild relief.  HPI  Past Medical History:  Diagnosis Date  . Abdominal pain, epigastric 06/11/2015  . Abdominal ultrasound, abnormal    normal  . Abnormal abdominal CT scan 01/20/2005   and pelvic CT, normal   . Allergic dermatitis 09/17/2015  . ALLERGIC RHINITIS WITH CONJUNCTIVITIS 06/30/2009   Qualifier: Diagnosis of  By: Amil Amen MD, Benjamine Mola    . Amenorrhea 03/29/2013  . Anxiety with somatization 03/12/2013  . Arthralgia 05/05/2014  . Cervical strain 04/07/2015  . Chronic headaches 01/06/2014  . Dandruff 01/06/2014  . Depressed   . Diabetes mellitus ([place]) 01/17/2013  . Diabetes mellitus without complication ([place])   . Dysuria 12/19/2012  . Eczema 04/04/2017  . Fatigue 04/04/2017  . Fatty liver   . History DEPRESSION 03/10/2007   Qualifier: Diagnosis of  By: Berta Minor tech, Anderson Malta    . History of barium enema    normal  . History of pelvic ultrasound 12/06   normal   . Lactose intolerance 08/13/2015  . Left axillary pain 02/12/2013  . Left genital labial abscess 06/05/2014  . Lower abdominal pain 12/07/2012  . MASTALGIA 03/10/2007   Qualifier: Diagnosis of  By: Berta Minor tech, Anderson Malta    . Pain, dental 12/08/2016  . Right upper quadrant abdominal pain 01/17/2013  . Shoulder  pain, left 09/04/2012  . Sinus congestion 03/12/2013  . Sinusitis, chronic 09/30/2013  . Somatic complaints, multiple   . SYMPTOM, PAIN, ABDOMINAL, OTH SPECIFIED SITE 01/10/2005   Qualifier: Diagnosis of  By: Berta Minor tech, Anderson Malta    . 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 Lovena [facility] at [facility]. Last visit was 2012. Instructed that if pt develops palpitations, then catheter ablation would be recommended.       Patient Active Problem List   Diagnosis Date Noted  . Eczema 04/04/2017  . Allergic dermatitis 09/17/2015  . Lactose intolerance 08/13/2015  . Cervical strain 04/07/2015  . Arthralgia 05/05/2014  . Chronic headaches 01/06/2014  . Amenorrhea 03/29/2013  . Diabetes mellitus ([place]) 01/17/2013  . 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 APPENDECTOMY    . caesarean section     x2  . CHOLECYSTECTOMY  2009    OB History    Gravida  4   Para  3   Term  3   Preterm      AB  1   Living  3     SAB      TAB      Ectopic  1   Multiple      Live Births  Home Medications    Prior to Admission medications   Medication Sig Start Date End Date Taking? Authorizing Provider  acetaminophen (TYLENOL) 500 MG tablet Take 1 tablet (500 mg total) by mouth every 6 (six) hours as needed. 12/03/18   Loura Halt A, NP  atorvastatin (LIPITOR) 20 MG tablet Take 20 mg by mouth daily.    [provider]  benzonatate (TESSALON) 200 MG capsule Take 1 capsule (200 mg total) by mouth 3 (three) times daily as needed for up to 7 days for cough. 06/18/19 06/25/19  ,  C, PA-C  Cetirizine HCl 10 MG CAPS Take 1 capsule (10 mg total) by mouth daily for 10 days. 06/18/19 06/28/19  ,  C, PA-C  empagliflozin (JARDIANCE) 10 MG TABS tablet Take 10 mg by mouth daily.    [provider]  ibuprofen (ADVIL) 600 MG tablet Take 1 tablet  (600 mg total) by mouth every 8 (eight) hours as needed. 06/18/19   ,  C, PA-C  metFORMIN (GLUCOPHAGE) 1000 MG tablet Take 1 tablet (1,000 mg total) by mouth 2 (two) times daily with a meal. 06/07/17   Nicolette Bang, DO    Family History Family History  Problem Relation Age of Onset  . Throat cancer Father        non-smoker  . Diabetes Mother   . Hypertension Mother     Social History Social History   Tobacco Use  . Smoking status: Never Smoker  . Smokeless tobacco: Never Used  Substance Use Topics  . Alcohol use: No    Alcohol/week: 0.0 standard drinks  . Drug use: No     Allergies   Patient has no known allergies.   Review of Systems Review of Systems  Constitutional: Positive for chills and fever. Negative for activity change, appetite change and fatigue.  HENT: Positive for sore throat. Negative for congestion, ear pain, rhinorrhea, sinus pressure and trouble swallowing.   Eyes: Negative for discharge and redness.  Respiratory: Negative for cough, chest tightness and shortness of breath.   Cardiovascular: Negative for chest pain.  Gastrointestinal: Negative for abdominal pain, diarrhea, nausea and vomiting.  Musculoskeletal: Negative for myalgias.  Skin: Negative for rash.  Neurological: Negative for dizziness, light-headedness and headaches.     Physical Exam Triage Vital Signs ED Triage Vitals  Enc Vitals Group     BP 06/18/19 1541 (!) 158/76     Pulse Rate 06/18/19 1541 71     Resp 06/18/19 1541 16     Temp 06/18/19 1541 98.5 F (36.9 C)     Temp Source 06/18/19 1541 Oral     SpO2 06/18/19 1541 100 %     Weight --      Height --      Head Circumference --      Peak Flow --      Pain Score 06/18/19 1538 4     Pain Loc --      Pain Edu? --      Excl. in [place]? --    No data found.  Updated Vital Signs BP (!) 158/76 (BP Location: Left Arm)   Pulse 71   Temp 98.5 F (36.9 C) (Oral)   Resp 16   SpO2 100%   Visual Acuity  Right Eye Distance:   Left Eye Distance:   Bilateral Distance:    Right Eye Near:   Left Eye Near:    Bilateral Near:     Physical Exam Vitals signs and nursing note  reviewed.  Constitutional:      General: She is not in acute distress.    Appearance: She is well-developed.  HENT:     Head: Normocephalic and atraumatic.     Ears:     Comments: Bilateral ears without tenderness to palpation of external auricle, tragus and mastoid, EAC's without erythema or swelling, TM's with good bony landmarks and cone of light. Non erythematous.     Nose:     Comments: Nasal mucosa pink, nonswollen turbinates, no rhinorrhea    Mouth/Throat:     Comments: Oral mucosa pink and moist, no tonsillar enlargement or exudate. Posterior pharynx patent and nonerythematous, no uvula deviation or swelling. Normal phonation.  Eyes:     Conjunctiva/sclera: Conjunctivae normal.  Neck:     Musculoskeletal: Neck supple.     Comments: No palpable lymphadenopathy, no overlying neck swelling or erythema Full active range of motion of neck Cardiovascular:     Rate and Rhythm: Normal rate and regular rhythm.     Heart sounds: No murmur.  Pulmonary:     Effort: Pulmonary effort is normal. No respiratory distress.     Breath sounds: Normal breath sounds.     Comments: Breathing comfortably at rest, CTABL, no wheezing, rales or other adventitious sounds auscultated Abdominal:     Palpations: Abdomen is soft.     Tenderness: There is no abdominal tenderness.  Skin:    General: Skin is warm and dry.  Neurological:     Mental Status: She is alert.      UC Treatments / Results  Labs (all labs ordered are listed, but only abnormal results are displayed) Labs Reviewed  NOVEL CORONAVIRUS, NAA (HOSPITAL ORDER, SEND-OUT TO REF LAB)  CULTURE, GROUP A STREP [facility])  POCT RAPID STREP A    EKG   Radiology No results found.  Procedures Procedures (including critical care time)  Medications Ordered in UC  Medications - No data to display  Initial Impression / Assessment and Plan / UC Course  I have reviewed the triage vital signs and the nursing notes.  Pertinent labs & imaging results that were available during my care of the patient were reviewed by me and considered in my medical decision making (see chart for details).     Strep test negative, oropharynx exam unremarkable, not suggestive of tonsillitis or peritonsillar abscess.  Most likely viral versus postnasal drainage.  COVID swab pending.  Will call if positive.  At this time will treat symptomatically and supportively.  Recommendations below.  Provided ibuprofen, cetirizine and initially Tessalon.  Tessalon switched to cough syrup as she cannot have gelatin. Discussed strict return precautions. Patient verbalized understanding and is agreeable with plan.  Final Clinical Impressions(s) / UC Diagnoses   Final diagnoses:  Sore throat     Discharge Instructions     Sore Throat  Your rapid strep tested Negative today. We will send for a culture and call in about 2 days if results are positive. COVID test pending.  Please continue Tylenol or Ibuprofen for fever and pain. May try salt water gargles, cepacol lozenges, throat spray, or OTC cold relief medicine for throat discomfort. If you also have congestion take a daily anti-histamine like Zyrtec, Claritin, and a oral decongestant to help with post nasal drip that may be irritating your throat.   Stay hydrated and drink plenty of fluids to keep your throat coated relieve irritation.    ED Prescriptions    Medication Sig Dispense Auth. Provider   ibuprofen (  ADVIL) 600 MG tablet Take 1 tablet (600 mg total) by mouth every 8 (eight) hours as needed. 30 tablet ,  C, PA-C   Cetirizine HCl 10 MG CAPS Take 1 capsule (10 mg total) by mouth daily for 10 days. 10 capsule ,  C, PA-C   benzonatate (TESSALON) 200 MG capsule Take 1 capsule (200 mg total) by mouth 3  (three) times daily as needed for up to 7 days for cough. 28 capsule ,  C, PA-C     Controlled Substance Prescriptions [place] Controlled Substance Registry consulted? Not Applicable   Janith Lima, [place] 06/18/19 1831

## 2019-06-18 NOTE — Discharge Instructions (Signed)
Sore Throat  Your rapid strep tested Negative today. We will send for a culture and call in about 2 days if results are positive. COVID test pending.  Please continue Tylenol or Ibuprofen for fever and pain. May try salt water gargles, cepacol lozenges, throat spray, or OTC cold relief medicine for throat discomfort. If you also have congestion take a daily anti-histamine like Zyrtec, Claritin, and a oral decongestant to help with post nasal drip that may be irritating your throat.   Stay hydrated and drink plenty of fluids to keep your throat coated relieve irritation.

## 2019-06-19 ENCOUNTER — Encounter (HOSPITAL_COMMUNITY): Payer: Self-pay

## 2019-06-19 LAB — NOVEL CORONAVIRUS, NAA (HOSP ORDER, SEND-OUT TO REF LAB; TAT 18-24 HRS): SARS-CoV-2, NAA: NOT DETECTED

## 2019-06-21 LAB — CULTURE, GROUP A STREP (THRC)

## 2021-11-17 ENCOUNTER — Other Ambulatory Visit: Payer: Self-pay | Admitting: Family Medicine

## 2021-11-17 ENCOUNTER — Ambulatory Visit
Admission: RE | Admit: 2021-11-17 | Discharge: 2021-11-17 | Disposition: A | Discharge status: Home / Self Care | Payer: No Typology Code available for payment source | Source: Ambulatory Visit | Attending: Family Medicine | Admitting: Family Medicine

## 2021-11-17 DIAGNOSIS — M25512 Pain in left shoulder: Secondary | ICD-10-CM

## 2021-11-17 DIAGNOSIS — N644 Mastodynia: Secondary | ICD-10-CM

## 2021-11-17 DIAGNOSIS — R599 Enlarged lymph nodes, unspecified: Secondary | ICD-10-CM

## 2021-11-25 ENCOUNTER — Ambulatory Visit: Payer: PRIVATE HEALTH INSURANCE

## 2021-11-25 ENCOUNTER — Other Ambulatory Visit: Payer: Self-pay

## 2021-12-15 ENCOUNTER — Ambulatory Visit: Payer: No Typology Code available for payment source

## 2021-12-15 ENCOUNTER — Ambulatory Visit
Admission: RE | Admit: 2021-12-15 | Discharge: 2021-12-15 | Disposition: A | Discharge status: Home / Self Care | Payer: No Typology Code available for payment source | Source: Ambulatory Visit | Attending: Family Medicine | Admitting: Family Medicine

## 2021-12-15 ENCOUNTER — Other Ambulatory Visit: Payer: Self-pay

## 2021-12-15 DIAGNOSIS — R599 Enlarged lymph nodes, unspecified: Secondary | ICD-10-CM

## 2021-12-15 DIAGNOSIS — N644 Mastodynia: Secondary | ICD-10-CM

## 2021-12-22 ENCOUNTER — Encounter (HOSPITAL_COMMUNITY): Payer: Self-pay

## 2021-12-22 ENCOUNTER — Ambulatory Visit (HOSPITAL_COMMUNITY)
Admission: EM | Admit: 2021-12-22 | Discharge: 2021-12-22 | Disposition: A | Discharge status: Home / Self Care | Payer: 59 | Attending: Family Medicine | Admitting: Family Medicine

## 2021-12-22 DIAGNOSIS — K0889 Other specified disorders of teeth and supporting structures: Secondary | ICD-10-CM

## 2021-12-22 DIAGNOSIS — B9689 Other specified bacterial agents as the cause of diseases classified elsewhere: Secondary | ICD-10-CM

## 2021-12-22 DIAGNOSIS — J019 Acute sinusitis, unspecified: Secondary | ICD-10-CM

## 2021-12-22 MED ORDER — DOXYCYCLINE HYCLATE 100 MG PO CAPS: 100.0000 mg | ORAL_CAPSULE | Freq: Two times a day (BID) | ORAL | 0 refills | Status: DC

## 2021-12-22 MED ORDER — FLUTICASONE PROPIONATE 50 MCG/ACT NA SUSP: 2.0000 | Freq: Every day | NASAL | 0 refills | Status: DC

## 2021-12-22 NOTE — Discharge Instructions (Addendum)
Follow up with your dentist about your pain after your wisdom tooth removal.

## 2021-12-22 NOTE — ED Provider Notes (Signed)
[facility]    CSN: [id number] Arrival date & time: 12/22/21  1707      History   Chief Complaint Chief Complaint  Patient presents with   Dental Pain   Otalgia    HPI [name] is a 56 y.o. female.  Patient reports 2 weeks of right third molar area dental pain in the lower jaw.  She had her third molar in that area removed last fall and reports this pain is new.  She did call her dentist who recommended she use an oral topical over-the-counter pain reliever such as Orajel which she has been doing.  She was instructed by her dentist to call back if she still had pain after 2 weeks.  She has not done this yet.  Also complains of left ear pain congestion and nasal congestion and sinus pressure for 2 weeks.  Discharge from her nose is thick and green.   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   Arthralgia 05/05/2014   Cervical strain 04/07/2015   Chronic headaches 01/06/2014   Dandruff 01/06/2014   Depressed    Diabetes mellitus ([place]) 01/17/2013   Diabetes mellitus without complication ([place])    Dysuria 12/19/2012   Eczema 04/04/2017   Fatigue 04/04/2017   Fatty liver    History DEPRESSION 03/10/2007   Qualifier: Diagnosis of  By: Berta Minor tech, Anderson Malta     History of barium enema    normal   History of pelvic ultrasound 12/06   normal    Lactose intolerance 08/13/2015   Left axillary pain 02/12/2013   Left genital labial abscess 06/05/2014   Lower  abdominal pain 12/07/2012   MASTALGIA 03/10/2007   Qualifier: Diagnosis of  By: Berta Minor tech, Jennifer     Pain, dental 12/08/2016   Right upper quadrant abdominal pain 01/17/2013   Shoulder pain, left 09/04/2012   Sinus congestion 03/12/2013   Sinusitis, chronic 09/30/2013   Somatic complaints, multiple    SYMPTOM, PAIN, ABDOMINAL, OTH SPECIFIED SITE 01/10/2005   Qualifier: Diagnosis of  By: Berta Minor tech, Jennifer     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 Lovena Le at [facility]. Last visit was 2012. Instructed that if pt develops palpitations, then catheter ablation would be recommended.       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 Authorizing Provider  doxycycline (VIBRAMYCIN) 100 MG capsule Take 1 capsule (100 mg total) by mouth 2 (two) times daily. 12/22/21  Yes Carvel Getting, NP  fluticasone (FLONASE) 50 MCG/ACT nasal spray Place 2 sprays into both nostrils daily. 12/22/21  Yes Carvel Getting, NP  acetaminophen (TYLENOL) 500 MG tablet Take 1 tablet (500 mg total) by mouth every 6 (six) hours as needed. 12/03/18   Loura Halt A, NP  atorvastatin (LIPITOR) 20 MG tablet Take 20 mg by mouth daily.    [provider]   brompheniramine-pseudoephedrine-DM 30-2-10 MG/5ML syrup Take 5 mLs by mouth 4 (four) times daily as needed. 06/18/19   Wieters, Hallie C, PA-C  Cetirizine HCl 10 MG CAPS Take 1 capsule (10 mg total) by mouth daily for 10 days. 06/18/19 06/28/19  Wieters, Hallie C, PA-C  empagliflozin (JARDIANCE) 10 MG TABS tablet Take 10 mg by mouth daily.    [provider]  ibuprofen (ADVIL) 600 MG tablet Take 1 tablet (600 mg total) by mouth every 8 (eight) hours as needed. 06/18/19   Wieters, Hallie C, PA-C  metFORMIN (GLUCOPHAGE) 1000 MG tablet Take 1 tablet (1,000 mg total) by mouth 2 (two) times daily with a meal. 06/07/17   Nicolette Bang, MD    Family History Family History  Problem Relation Age of Onset   Throat cancer Father        non-smoker   Diabetes Mother    Hypertension Mother     Social History Social History   Tobacco Use   Smoking status: Never   Smokeless tobacco: Never  Substance Use Topics   Alcohol use: No    Alcohol/week: 0.0 standard drinks   Drug use: No     Allergies   Amoxicillin   Review of Systems Review of Systems  Constitutional:  Negative for chills and fever.  HENT:  Positive for congestion, dental problem, ear pain, sinus pressure and sinus pain.   Respiratory:  Negative for cough and shortness of breath.     Physical Exam Triage Vital Signs ED Triage Vitals [12/22/21 1756]  Enc Vitals Group     BP (!) 149/85     Pulse Rate 88     Resp 18     Temp 97.9 F (36.6 C)     Temp Source Oral     SpO2 98 %     Weight      Height      Head Circumference      Peak Flow      Pain Score 8     Pain Loc      Pain Edu?      Excl. in [place]?    No data found.  Updated Vital Signs BP (!) 149/85 (BP Location: Left Arm)    Pulse 88    Temp 97.9 F (36.6 C) (Oral)    Resp 18    SpO2 98%   Visual Acuity Right Eye Distance:   Left Eye Distance:   Bilateral Distance:    Right Eye Near:   Left Eye Near:  Bilateral Near:     Physical  Exam Constitutional:      General: She is not in acute distress.    Appearance: Normal appearance. She is not ill-appearing.  HENT:     Right Ear: Tympanic membrane, ear canal and external ear normal.     Left Ear: Tympanic membrane, ear canal and external ear normal.     Nose: Congestion present.     Right Sinus: Maxillary sinus tenderness present.     Left Sinus: Maxillary sinus tenderness present.     Mouth/Throat:     Mouth: Mucous membranes are moist.     Pharynx: Oropharynx is clear.   Cardiovascular:     Rate and Rhythm: Normal rate and regular rhythm.  Pulmonary:     Effort: Pulmonary effort is normal.     Breath sounds: Normal breath sounds.  Neurological:     Mental Status: She is alert.     UC Treatments / Results  Labs (all labs ordered are listed, but only abnormal results are displayed) Labs Reviewed - No data to display  EKG   Radiology No results found.  Procedures Procedures (including critical care time)  Medications Ordered in UC Medications - No data to display  Initial Impression / Assessment and Plan / UC Course  I have reviewed the triage vital signs and the nursing notes.  Pertinent labs & imaging results that were available during my care of the patient were reviewed by me and considered in my medical decision making (see chart for details).    Tx for bacterial sinusitis given duration of symptoms. Pt to f/u with dentist about dental pain.   Final Clinical Impressions(s) / UC Diagnoses   Final diagnoses:  Acute bacterial sinusitis  Pain, dental     Discharge Instructions      Follow up with your dentist about your pain after your wisdom tooth removal.    ED Prescriptions     Medication Sig Dispense Auth. Provider   doxycycline (VIBRAMYCIN) 100 MG capsule Take 1 capsule (100 mg total) by mouth 2 (two) times daily. 20 capsule Carvel Getting, NP   fluticasone (FLONASE) 50 MCG/ACT nasal spray Place 2 sprays into both nostrils  daily. 16 g Carvel Getting, NP      PDMP not reviewed this encounter.   Carvel Getting, NP 12/22/21 1827

## 2021-12-22 NOTE — ED Triage Notes (Signed)
Pt c/o rt lower toothache x1wk  Pt c/o lt ear and nasal congestion for 1wk.

## 2022-01-31 ENCOUNTER — Other Ambulatory Visit: Payer: Self-pay | Admitting: Otolaryngology

## 2022-01-31 ENCOUNTER — Other Ambulatory Visit (HOSPITAL_COMMUNITY): Payer: Self-pay | Admitting: Otolaryngology

## 2022-01-31 DIAGNOSIS — R1310 Dysphagia, unspecified: Secondary | ICD-10-CM

## 2022-01-31 DIAGNOSIS — R070 Pain in throat: Secondary | ICD-10-CM

## 2022-02-10 ENCOUNTER — Ambulatory Visit
Admission: RE | Admit: 2022-02-10 | Discharge: 2022-02-10 | Disposition: A | Discharge status: Home / Self Care | Payer: 59 | Source: Ambulatory Visit | Attending: Otolaryngology | Admitting: Otolaryngology

## 2022-02-10 DIAGNOSIS — R070 Pain in throat: Secondary | ICD-10-CM | POA: Diagnosis present

## 2022-02-10 DIAGNOSIS — R1310 Dysphagia, unspecified: Secondary | ICD-10-CM | POA: Insufficient documentation

## 2022-02-10 LAB — POCT I-STAT CREATININE: Creatinine, Ser: 0.7 mg/dL (ref 0.44–1.00)

## 2022-02-10 MED ORDER — IOHEXOL 300 MG/ML  SOLN
75.0000 mL | Freq: Once | INTRAMUSCULAR | Status: AC | PRN
  Administered 2022-02-10: 75 mL via INTRAVENOUS

## 2022-12-30 DIAGNOSIS — Z419 Encounter for procedure for purposes other than remedying health state, unspecified: Secondary | ICD-10-CM | POA: Diagnosis not present

## 2023-01-02 NOTE — Therapy (Signed)
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   Arthralgia 05/05/2014   Cervical strain 04/07/2015   Chronic headaches 01/06/2014   Dandruff 01/06/2014   Depressed    Diabetes mellitus ([place]) 01/17/2013   Diabetes mellitus without complication ([place])    Dysuria 12/19/2012   Eczema 04/04/2017   Fatigue 04/04/2017   Fatty liver    History DEPRESSION 03/10/2007   Qualifier: Diagnosis of  By: Berta Minor tech, Anderson Malta     History of barium enema    normal   History of pelvic ultrasound 12/06   normal    Lactose intolerance 08/13/2015   Left axillary pain 02/12/2013   Left genital labial abscess 06/05/2014   Lower abdominal pain 12/07/2012   MASTALGIA 03/10/2007   Qualifier: Diagnosis of  By: Berta Minor tech, Jennifer     Pain, dental 12/08/2016   Right upper quadrant abdominal pain 01/17/2013   Shoulder pain, left 09/04/2012   Sinus congestion 03/12/2013   Sinusitis, chronic 09/30/2013   Somatic complaints, multiple    SYMPTOM, PAIN, ABDOMINAL, OTH SPECIFIED SITE 01/10/2005   Qualifier: Diagnosis of  By: Berta Minor tech, Jennifer     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 Lovena Le at [facility]. Last visit was 2012. Instructed that if pt develops palpitations, then catheter ablation would be recommended.      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  Rationale for Evaluation and Treatment: Rehabilitation  ONSET DATE: ***  SUBJECTIVE:  SUBJECTIVE STATEMENT: ***  PERTINENT HISTORY: DM, chronic HA, ***  PAIN:  Are you having pain? Yes: NPRS scale: ***/10 Pain location: *** Pain description: *** Aggravating factors: *** Relieving factors: ***  PRECAUTIONS: {Therapy precautions:24002}  WEIGHT BEARING RESTRICTIONS: No  FALLS:  Has patient fallen in last 6 months? {fallsyesno:27318}  LIVING ENVIRONMENT: Lives with: {[facility] lives with:25569::"lives with their family"} Lives in: {Lives in:25570} Stairs:  N/A Has following equipment at home: None  OCCUPATION: ***  PLOF: {PLOF:24004}  PATIENT GOALS: ***  NEXT MD VISIT: ***  OBJECTIVE:   DIAGNOSTIC FINDINGS:  XR L SHOULDER - NEG 11/18/21 CT NECK - unremarkable 02/10/22  PATIENT SURVEYS :  FOTO ***  COGNITION: Overall cognitive status: {cognition:24006}     SENSATION: {sensation:27233}  POSTURE: ***  UPPER EXTREMITY ROM:   {AROM/PROM:27142} ROM Right eval Left eval  Shoulder flexion    Shoulder extension    Shoulder abduction    Shoulder adduction    Shoulder internal rotation    Shoulder external rotation    Elbow flexion    Elbow extension    Wrist flexion    Wrist extension     Wrist ulnar deviation    Wrist radial deviation    Wrist pronation    Wrist supination    (Blank rows = not tested)  UPPER EXTREMITY MMT:  MMT Right eval Left eval  Shoulder flexion    Shoulder extension    Shoulder abduction    Shoulder adduction    Shoulder internal rotation    Shoulder external rotation    Middle trapezius    Lower trapezius    Elbow flexion    Elbow extension    Wrist flexion    Wrist extension    Wrist ulnar deviation    Wrist radial deviation    Wrist pronation    Wrist supination    Grip strength (lbs)    (Blank rows = not tested)  SHOULDER SPECIAL TESTS: Impingement tests: {shoulder impingement test:25231:a} SLAP lesions: {SLAP lesions:25232} Instability tests: {shoulder instability test:25233} Rotator cuff assessment: {rotator cuff assessment:25234} Biceps assessment: {biceps assessment:25235}  JOINT MOBILITY TESTING:  ***  PALPATION:  ***   TODAY'S TREATMENT:                                                                                                                                         DATE: ***  01/02/33 eval only  PATIENT EDUCATION: Education details: *** Person educated: {Person educated:25204} Education method: {Education Method:25205} Education comprehension: {Education Comprehension:25206}  HOME EXERCISE PROGRAM: ***  ASSESSMENT:  CLINICAL IMPRESSION: Patient is a 57  y.o. female who was seen today for physical therapy evaluation and treatment for left shoulder pain. ***.    OBJECTIVE IMPAIRMENTS: {opptimpairments:25111}.   ACTIVITY LIMITATIONS: {activitylimitations:27494}  PARTICIPATION LIMITATIONS: {participationrestrictions:25113}  PERSONAL FACTORS: {Personal factors:25162} are also affecting patient's functional outcome.   REHAB POTENTIAL: {rehabpotential:25112}  CLINICAL DECISION MAKING: {  clinical decision making:25114}  EVALUATION COMPLEXITY: {Evaluation complexity:25115}  GOALS: Goals  reviewed with patient? {yes/no:20286}  SHORT TERM GOALS: Target date: {follow up:25551} (Remove Blue Hyperlink)  Patient will be independent with initial HEP.  Baseline: *** Goal status: {GOALSTATUS:25110}  2.  ***  Baseline: *** Goal status: {GOALSTATUS:25110}  3.  *** Baseline: *** Goal status: {GOALSTATUS:25110}   LONG TERM GOALS: Target date: {follow up:25551} (Remove Blue Hyperlink)  Patient will be independent with advanced/ongoing HEP to improve outcomes and carryover.  Baseline: *** Goal status: {GOALSTATUS:25110}  2.  Patient will report 75% improvement in neck pain to improve QOL.  Baseline: *** Goal status: {GOALSTATUS:25110}  3.  Patient will demonstrate full pain free cervical ROM for safety with driving.  Baseline: *** Goal status: {GOALSTATUS:25110}  4.  Patient will demonstrate improved posture to decrease muscle imbalance. Baseline: *** Goal status: {GOALSTATUS:25110}  5.  Patient will report *** on ***(patient outcome measure)  to demonstrate improved functional ability.  Baseline: *** Goal status: {GOALSTATUS:25110}  6.  Patient will ***   Baseline: *** Goal status: {GOALSTATUS:25110}   7. *** Baseline: *** Goal status: {GOALSTATUS:25110}   PLAN: PT FREQUENCY: {rehab frequency:25116}  PT DURATION: {rehab duration:25117}  PLANNED INTERVENTIONS: {rehab planned interventions:25118::"Therapeutic exercises","Therapeutic activity","Neuromuscular re-education","Balance training","Gait training","Patient/Family education","Self Care","Joint mobilization"}  PLAN FOR NEXT SESSION: ***   Neilan Rizzo, PT 01/02/2023, 8:34 PM

## 2023-01-03 ENCOUNTER — Encounter: Payer: Self-pay | Admitting: Physical Therapy

## 2023-01-03 ENCOUNTER — Other Ambulatory Visit: Payer: Self-pay

## 2023-01-03 ENCOUNTER — Ambulatory Visit: Payer: Medicaid Other | Attending: Sports Medicine | Admitting: Physical Therapy

## 2023-01-03 DIAGNOSIS — M25512 Pain in left shoulder: Secondary | ICD-10-CM

## 2023-01-03 DIAGNOSIS — M25552 Pain in left hip: Secondary | ICD-10-CM | POA: Insufficient documentation

## 2023-01-03 DIAGNOSIS — R252 Cramp and spasm: Secondary | ICD-10-CM

## 2023-01-30 DIAGNOSIS — Z419 Encounter for procedure for purposes other than remedying health state, unspecified: Secondary | ICD-10-CM | POA: Diagnosis not present

## 2023-01-31 ENCOUNTER — Ambulatory Visit: Payer: Medicaid Other | Admitting: Physical Therapy

## 2023-02-14 ENCOUNTER — Ambulatory Visit: Payer: Medicaid Other | Attending: Sports Medicine | Admitting: Physical Therapy

## 2023-02-14 ENCOUNTER — Telehealth: Payer: Self-pay | Admitting: Physical Therapy

## 2023-02-14 DIAGNOSIS — M25552 Pain in left hip: Secondary | ICD-10-CM | POA: Insufficient documentation

## 2023-02-14 DIAGNOSIS — R252 Cramp and spasm: Secondary | ICD-10-CM | POA: Insufficient documentation

## 2023-02-14 DIAGNOSIS — M25512 Pain in left shoulder: Secondary | ICD-10-CM | POA: Insufficient documentation

## 2023-02-14 NOTE — Telephone Encounter (Signed)
Called and spoke with Ms. Boyte, she thought her appt was tomorrow.  Reminded her of the next appt 4/23 at 3:30

## 2023-02-21 ENCOUNTER — Ambulatory Visit: Payer: Medicaid Other | Admitting: Physical Therapy

## 2023-02-21 DIAGNOSIS — R252 Cramp and spasm: Secondary | ICD-10-CM

## 2023-02-21 DIAGNOSIS — M25512 Pain in left shoulder: Secondary | ICD-10-CM

## 2023-02-21 DIAGNOSIS — M25552 Pain in left hip: Secondary | ICD-10-CM | POA: Diagnosis not present

## 2023-02-21 NOTE — Therapy (Signed)
OUTPATIENT PHYSICAL THERAPY LOWER AND UPPER EXTREMITY PROGRESS NOTE   Patient Name: [name] MRN: [id number] DOB:[birth year], 57 y.o., female Today's Date: 02/21/2023  END OF SESSION:  PT End of Session - 02/21/23 1533     Visit Number 2    Number of Visits 12    Date for PT Re-Evaluation 03/28/23    Authorization Type [place] [facility] [employer] 12 visits 3/5-03/04/23    PT Start Time 1534    PT Stop Time 1615    PT Time Calculation (min) 41 min    Activity Tolerance Patient tolerated treatment well             Past Medical History:  Diagnosis Date   Abdominal pain, epigastric 06/11/2015   Abdominal ultrasound, abnormal    normal   Abnormal abdominal CT scan 01/20/2005   and pelvic CT, normal    Allergic dermatitis 09/17/2015   ALLERGIC RHINITIS WITH CONJUNCTIVITIS 06/30/2009   Qualifier: Diagnosis of  By: Delrae Alfred MD, Elizabeth     Amenorrhea 03/29/2013   Anxiety with somatization 03/12/2013   Arthralgia 05/05/2014   Cervical strain 04/07/2015   Chronic headaches 01/06/2014   Dandruff 01/06/2014   Depressed    Diabetes mellitus (HCC) 01/17/2013   Diabetes mellitus without complication (HCC)    Dysuria 12/19/2012   Eczema 04/04/2017   Fatigue 04/04/2017   Fatty liver    History DEPRESSION 03/10/2007   Qualifier: Diagnosis of  By: Valinda Hoar tech, Victorino Dike     History of barium enema    normal   History of pelvic ultrasound 12/06   normal    Lactose intolerance 08/13/2015   Left axillary pain 02/12/2013   Left genital labial abscess 06/05/2014   Lower abdominal pain 12/07/2012   MASTALGIA 03/10/2007   Qualifier: Diagnosis of  By: Valinda Hoar tech, Jennifer     Pain, dental 12/08/2016   Right upper quadrant abdominal pain 01/17/2013   Shoulder pain, left 09/04/2012   Sinus congestion 03/12/2013   Sinusitis, chronic 09/30/2013   Somatic complaints, multiple    SYMPTOM, PAIN, ABDOMINAL, OTH SPECIFIED SITE 01/10/2005   Qualifier: Diagnosis of  By: Valinda Hoar tech, Jennifer     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 Ladona Ridgel at [facility]. Last visit was 2012. Instructed that if pt develops palpitations, then catheter ablation would be recommended.      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  Explained option of DN but patient is Fearful of needles.  Weakness with opening a water bottle.    Accompanied by daughter for interpreter  PERTINENT HISTORY: DM, chronic HA, HTN  PAIN:  Are you having pain? Yes: NPRS scale: 7/10 Pain location: left hip on bone Pain description: squeezing and sharp Aggravating factors: lying on it, walking Relieving factors: change of postion  Are you having pain? Yes: NPRS scale: 7/10 Pain location: left shoulder Pain description: pain at bone Aggravating factors: lifting or holding something heavy and when sleeps on it Relieving factors:    PRECAUTIONS:  None  WEIGHT BEARING RESTRICTIONS: No  FALLS:  Has patient fallen in last 6 months? 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, but now sitting  PLOF: Independent  PATIENT GOALS: get rid of pain  NEXT MD VISIT: none scheduled  OBJECTIVE:   DIAGNOSTIC FINDINGS:  XR L SHOULDER - NEG 11/18/21 CT NECK - unremarkable 02/10/22  PATIENT SURVEYS :  LEFS 25 / 80 = 31.3 %  COGNITION: Overall cognitive status: Within functional limits for tasks assessed     SENSATION: WFL  POSTURE: Mild forward head, rounded shoulders  LOWER EXTREMITY ROM:   WFL except L hip IR limited by tightness and pain; functionally unable to cross ankle over knee   LOWER EXTREMITY MMT:  5/5 bil  LOWER EXTREMITY SPECIAL TESTS:  Hip special tests: Luisa Hart (FABER) test: positive  and Hip scouring test: positive  Left side and pain in greater trochanter area   UPPER EXTREMITY ROM:  WFL, tightness with left flexion, but full Passive with pain, tightness with functional IR behind back to L 2 bil and pain in ant L shoulder.   UPPER EXTREMITY MMT:  MMT Right eval Left eval  Shoulder flexion  4+  Shoulder extension  5  Shoulder abduction  5  Shoulder adduction    Shoulder internal rotation  5  Shoulder external rotation  4+  Middle trapezius    Lower trapezius    Grip strength (lbs)    (Blank rows = not tested)  SHOULDER SPECIAL TESTS: Impingement tests: Neer impingement test: positive  and Hawkins/Kennedy impingement test: negative   PALPATION:  Marked TTP at L glute min and med and piriformis; also at L IS, SS and lats muscles and SS insertion.    TODAY'S TREATMENT:                                                                                                                                         DATE:   4/23: Manual therapy: sidelying left upper trap soft tissue mobilization, left scapular mobilization, glenohumeral  distraction, glenohumeral inferior mobs grade 3; passive shoulder flexion 10x Supine active assisted clasped hand shoulder flexion 10x Seated piriformis stretch (needs UE assist with left) 3x 20 sec holds Standing in doorway: hip flexor and pectoral stretch (painful left shoulder with attempted UE elevation movements) Standing with green band anchored on doorknob: bil rows 10x Standing with green band anchored on doorknob: bil shoulder extensions 10x Patient given green band for home   PATIENT EDUCATION: Education details: PT eval findings, anticipated POC, role of DN, and educated on greater trochanteric bursitis and ionto ' Person educated: Patient and Child(ren) Education method: Explanation and Handouts Education comprehension: verbalized understanding  HOME EXERCISE PROGRAM: Access Code: [email or web address] URL: [email or web address] Date: 02/21/2023 Prepared by: Lavinia Sharps  Exercises - Supine Shoulder Flexion AAROM with Hands Clasped  - 1 x daily - 7 x weekly - 1 sets - 10 reps - Clamshell  - 1 x daily - 7 x weekly - 1 sets - 10 reps - Seated Figure 4 Piriformis Stretch  - 1 x daily - 7 x weekly - 1 sets - 2-3 reps - 20 hold - Standing Low Shoulder Row with Anchored Resistance  - 1 x daily - 7 x weekly - 1 sets - 10 reps - Shoulder Extension with Resistance Hands Down  - 1 x daily - 7 x weekly - 1 sets - 10 reps  Patient Education - Trigger Point Dry Needling - Trochanteric Bursitis  ASSESSMENT:  CLINICAL IMPRESSION: Patient returns for 2nd visit with reports of left shoulder pain > left hip pain.  Pain primarily in left upper trap and scapular region.  Discussed option of DN however patient defers at this time secondary to needle phobia.  No capsular restriction noted at the glenohumeral joint/empty painful endfeel with endrange flexion and abduction.   Left hip external rotation ROM limited in sitting but improved with repeated stretching.  Therapist monitoring response to all interventions and modifying treatment accordingly. Therapist progressing and updating HEP  for further strengthening and functional mobility.      OBJECTIVE IMPAIRMENTS: Abnormal gait, decreased activity tolerance, decreased ROM, decreased strength, increased muscle spasms, impaired flexibility, impaired UE functional use, and pain.   ACTIVITY LIMITATIONS: carrying, lifting, sitting, standing, squatting, stairs, transfers, bed mobility, dressing, and locomotion level  PARTICIPATION LIMITATIONS: occupation  PERSONAL FACTORS: Fitness and 3+ comorbidities: DM, chronic HA, HTN  are also affecting patient's functional outcome.   REHAB POTENTIAL: Excellent  CLINICAL DECISION MAKING: Evolving/moderate complexity  EVALUATION COMPLEXITY: Moderate  GOALS: Goals reviewed with patient? Yes  SHORT TERM GOALS: Target date: 01/31/2023   Patient will be independent with initial HEP. Baseline: no HEP Goal status: ongoing    LONG TERM GOALS: Target date: 03/28/2023   Patient will be independent with advanced/ongoing HEP to improve outcomes and carryover.  Baseline: initial HEP Goal status: INITIAL  2.  Patient will report at least 75% improvement in L hip and shoulder pain to improve QOL. Baseline: Hip 8/10, shoulder 1/10 Goal status: INITIAL  3.  Patient will be able to walk with a normal gait upon sit to stand transfer. Baseline: immediate pain and antalgic gait for first 4-5 steps Goal status: INITIAL  4.  Patient will be able to don/doff clothing without pain in the L shoulder Baseline: pain with horizontal ABD + ER Goal status: INITIAL  5.  Patient will report 34/80 on LEFS  to demonstrate improved functional ability. Baseline: LEFS 25 / 80 = 31.3 % Goal status: INITIAL   PLAN: PT  FREQUENCY: 1x/week  PT DURATION: 12 weeks  PLANNED INTERVENTIONS:  Therapeutic exercises, Therapeutic activity, Neuromuscular re-education, Balance training, Gait training, Patient/Family education, Self Care, Joint mobilization, Stair training, Dry Needling, Spinal mobilization, Cryotherapy, Moist heat, Ultrasound, Ionotophoresis /ml Dexamethasone, Manual therapy, and Re-evaluation  PLAN FOR NEXT SESSION: recent initial cert for signature;  MT to left gluteals, piriformis, L hip stretching, clams, possible ionto to L hip if cert signed; Shoulder: MT, pec stretches, strengthening; add additional band ex's to HEP;  patient fearful of DN  Lavinia Sharps, PT 02/21/23 5:07 PM Phone: [phone] Fax: [phone]

## 2023-02-28 ENCOUNTER — Encounter: Payer: Self-pay | Admitting: Rehabilitative and Restorative Service Providers"

## 2023-02-28 ENCOUNTER — Ambulatory Visit: Payer: Medicaid Other | Admitting: Rehabilitative and Restorative Service Providers"

## 2023-02-28 DIAGNOSIS — R252 Cramp and spasm: Secondary | ICD-10-CM

## 2023-02-28 DIAGNOSIS — M25512 Pain in left shoulder: Secondary | ICD-10-CM

## 2023-02-28 DIAGNOSIS — M25552 Pain in left hip: Secondary | ICD-10-CM | POA: Diagnosis not present

## 2023-02-28 NOTE — Therapy (Addendum)
OUTPATIENT PHYSICAL THERAPY TREATMENT NOTE/DISCHARGE SUMMARY   Patient Name: [name] MRN: [id number] DOB:04/24/1966, 57 y.o., female Today's Date: 02/28/2023  END OF SESSION:  PT End of Session - 02/28/23 1454     Visit Number 3    Number of Visits 12    Date for PT Re-Evaluation 03/28/23    Authorization Type [place] [facility]    Authorization Time Period Approved 12 visits 01/03/2023 - 04/03/2023    Authorization - Visit Number 2    Authorization - Number of Visits 12    PT Start Time 1450    PT Stop Time 1530    PT Time Calculation (min) 40 min    Activity Tolerance Patient tolerated treatment well    Behavior During Therapy WFL for tasks assessed/performed             Past Medical History:  Diagnosis Date   Abdominal pain, epigastric 06/11/2015   Abdominal ultrasound, abnormal    normal   Abnormal abdominal CT scan 01/20/2005   and pelvic CT, normal    Allergic dermatitis 09/17/2015   ALLERGIC RHINITIS WITH CONJUNCTIVITIS 06/30/2009   Qualifier: Diagnosis of  By: Delrae Alfred MD, Elizabeth     Amenorrhea 03/29/2013   Anxiety with somatization 03/12/2013   Arthralgia 05/05/2014   Cervical strain 04/07/2015   Chronic headaches 01/06/2014   Dandruff 01/06/2014   Depressed    Diabetes mellitus (HCC) 01/17/2013   Diabetes mellitus without complication (HCC)    Dysuria 12/19/2012   Eczema 04/04/2017   Fatigue 04/04/2017   Fatty liver    History DEPRESSION 03/10/2007   Qualifier: Diagnosis of  By: Valinda Hoar tech, Victorino Dike     History of barium enema    normal   History of pelvic ultrasound 12/06   normal    Lactose intolerance 08/13/2015   Left axillary pain 02/12/2013   Left genital labial abscess 06/05/2014   Lower abdominal pain 12/07/2012   MASTALGIA 03/10/2007   Qualifier: Diagnosis of  By: Valinda Hoar tech, Jennifer     Pain, dental 12/08/2016   Right upper quadrant abdominal pain 01/17/2013   Shoulder pain, left 09/04/2012   Sinus congestion 03/12/2013   Sinusitis,  chronic 09/30/2013   Somatic complaints, multiple    SYMPTOM, PAIN, ABDOMINAL, OTH SPECIFIED SITE 01/10/2005   Qualifier: Diagnosis of  By: Valinda Hoar tech, Jennifer     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 Ladona Ridgel at [facility]. Last visit was 2012. Instructed that if pt develops palpitations, then catheter ablation would be recommended.      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  Accompanied by son for interpreter  PERTINENT HISTORY: DM, chronic HA, HTN  PAIN:  Are you having pain? Yes: NPRS scale: 4/10 Pain location: left hip on bone Pain description: squeezing and sharp Aggravating factors: lying on it, walking Relieving factors: change of postion  Are you having pain? Yes: NPRS scale: 2/10 Pain location: left shoulder Pain description: pain at bone Aggravating factors: lifting or holding something  heavy and when sleeps on it Relieving factors:    PRECAUTIONS: None  WEIGHT BEARING RESTRICTIONS: No  FALLS:  Has patient fallen in last 6 months? 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, but now sitting  PLOF: Independent  PATIENT GOALS: get rid of pain  NEXT MD VISIT: none scheduled  OBJECTIVE:   DIAGNOSTIC FINDINGS:  XR L SHOULDER - NEG 11/18/21 CT NECK - unremarkable 02/10/22  PATIENT SURVEYS :  LEFS 25 / 80 = 31.3 %  COGNITION: Overall cognitive status: Within functional limits for tasks assessed     SENSATION: WFL  POSTURE: Mild forward head, rounded shoulders  LOWER EXTREMITY ROM:   WFL except L hip IR limited by tightness and pain; functionally unable to cross ankle over knee   LOWER EXTREMITY MMT:  5/5 bil  LOWER EXTREMITY SPECIAL TESTS:  Hip special tests: Luisa Hart (FABER) test: positive  and Hip scouring test: positive  Left side and pain in greater trochanter area   UPPER EXTREMITY ROM:  WFL, tightness with left flexion, but full Passive with pain, tightness with functional IR behind back to L 2 bil and pain in ant L shoulder.   UPPER EXTREMITY MMT:  MMT Right eval Left eval  Shoulder flexion  4+  Shoulder extension  5  Shoulder abduction  5  Shoulder adduction    Shoulder internal rotation  5  Shoulder external rotation  4+  Middle trapezius    Lower trapezius    Grip strength (lbs)    (Blank rows = not tested)  SHOULDER SPECIAL TESTS: Impingement tests: Neer impingement test: positive  and Hawkins/Kennedy impingement test: negative   PALPATION:  Marked TTP at L glute min and med and piriformis; also at L IS, SS and lats muscles and SS insertion.    TODAY'S TREATMENT:                                                                                                                                           DATE: 02/28/2023 UBE level 1.0 x3 min each direction Seated upper trap stretch 2x20 sec bilat Seated shoulder ER and horizontal abduction with green 2x10 tband each Standing shoulder rows and shoulder extension with green tband 2x10 each  Seated piriformis stretch 2x20 sec bilat Seated hamstring stretch 2x20 sec bilat Seated active assisted clasped hand shoulder flexion 10x Seated clamshell with green tband 2x10 Manual Therapy:  Addaday to left hip region- glute/piriformis, IT band, hamstring.  Manual soft tissue mobilization to left scapula, left thoracic paraspinals, left rhomboids, and upper trap.   DATE: 4/23: Manual therapy: sidelying left upper trap soft tissue mobilization, left scapular mobilization, glenohumeral distraction, glenohumeral inferior mobs grade 3; passive shoulder flexion 10x Supine active assisted clasped hand shoulder flexion 10x Seated piriformis stretch (needs UE assist with left) 3x 20 sec holds Standing in doorway: hip flexor and pectoral stretch (painful left shoulder with attempted UE elevation movements) Standing with green band anchored on doorknob: bil rows 10x Standing with green band anchored on doorknob: bil shoulder extensions 10x Patient given green band for home   PATIENT EDUCATION: Education details: PT eval findings, anticipated POC, role of DN, and educated on greater trochanteric bursitis and ionto ' Person educated: Patient and Child(ren) Education method: Explanation and Handouts Education comprehension: verbalized understanding  HOME EXERCISE PROGRAM: Access Code: [email or web address] URL: [email or web address] Date: 02/21/2023 Prepared by: Lavinia Sharps  Exercises - Supine Shoulder Flexion AAROM with Hands Clasped  - 1 x daily - 7 x weekly - 1 sets - 10 reps - Clamshell  - 1 x daily - 7 x weekly - 1 sets - 10 reps - Seated Figure 4 Piriformis Stretch  - 1 x daily - 7 x  weekly - 1 sets - 2-3 reps - 20 hold - Standing Low Shoulder Row with Anchored Resistance  - 1 x daily - 7 x weekly - 1 sets - 10 reps - Shoulder Extension with Resistance Hands Down  - 1 x daily - 7 x weekly - 1 sets - 10 reps  Patient Education - Trigger Point Dry Needling - Trochanteric Bursitis  ASSESSMENT:  CLINICAL IMPRESSION: Ms Richberg presents to skilled PT reporting that she has been feeling less pain and that the exercises are helping.  Patient able to progress with strengthening exercises during session with minimal cuing for technique.  Patient reports a reduction of pain following manual therapy and stating that following soft tissue mobilization of left scapular region, she felt that she could breath better.  Patient continues to progress towards goal related activities.   OBJECTIVE IMPAIRMENTS: Abnormal gait, decreased activity tolerance, decreased ROM, decreased strength, increased muscle spasms, impaired flexibility, impaired UE functional use, and pain.   ACTIVITY LIMITATIONS: carrying, lifting, sitting, standing, squatting, stairs, transfers, bed mobility, dressing, and locomotion level  PARTICIPATION LIMITATIONS: occupation  PERSONAL FACTORS: Fitness and 3+ comorbidities: DM, chronic HA, HTN  are also affecting patient's functional outcome.   REHAB POTENTIAL: Excellent  CLINICAL DECISION MAKING: Evolving/moderate complexity  EVALUATION COMPLEXITY: Moderate  GOALS: Goals reviewed with patient? Yes  SHORT TERM GOALS: Target date: 01/31/2023   Patient will be independent with initial HEP. Baseline: no HEP Goal status: MET    LONG TERM GOALS: Target date: 03/28/2023   Patient will be independent with advanced/ongoing HEP to improve outcomes and carryover.  Baseline: initial HEP Goal status: INITIAL  2.  Patient will report at least 75% improvement in L hip and shoulder pain to improve QOL. Baseline: Hip 8/10, shoulder 1/10 Goal status: INITIAL  3.   Patient will be able to walk with a normal gait upon sit to stand transfer. Baseline: immediate pain and antalgic gait for first 4-5 steps Goal status: INITIAL  4.  Patient will be able to  don/doff clothing without pain in the L shoulder Baseline: pain with horizontal ABD + ER Goal status: INITIAL  5.  Patient will report 34/80 on LEFS  to demonstrate improved functional ability. Baseline: LEFS 25 / 80 = 31.3 % Goal status: INITIAL   PLAN: PT FREQUENCY: 1x/week  PT DURATION: 12 weeks  PLANNED INTERVENTIONS: Therapeutic exercises, Therapeutic activity, Neuromuscular re-education, Balance training, Gait training, Patient/Family education, Self Care, Joint mobilization, Stair training, Dry Needling, Spinal mobilization, Cryotherapy, Moist heat, Ultrasound, Ionotophoresis 4mg /ml Dexamethasone, Manual therapy, and Re-evaluation  PLAN FOR NEXT SESSION: recent initial cert for signature;  MT to left gluteals, piriformis, L hip stretching, clams, possible ionto to L hip if cert signed; Shoulder: MT, pec stretches, strengthening; add additional band ex's to HEP;  patient fearful of DN  Reather Laurence, PT 02/28/23 3:48 PM  [facility] [address] [place], [place] [address] Phone # [phone] Fax [phone] PHYSICAL THERAPY DISCHARGE SUMMARY  Visits from Start of Care: 3  Current functional level related to goals / functional outcomes: Patient no-showed for last 2 appts.  Will discharge from PT at this time.   Remaining deficits: As above   Education / Equipment: HEP   Patient agrees to discharge. Patient goals were met. Patient is being discharged due to not returning since the last visit.  Lavinia Sharps, PT 03/29/23 7:36 AM Phone: [phone] Fax: [phone]

## 2023-03-01 DIAGNOSIS — Z419 Encounter for procedure for purposes other than remedying health state, unspecified: Secondary | ICD-10-CM | POA: Diagnosis not present

## 2023-03-07 ENCOUNTER — Ambulatory Visit: Payer: Medicaid Other | Admitting: Physical Therapy

## 2023-03-14 ENCOUNTER — Ambulatory Visit: Payer: Medicaid Other | Attending: Sports Medicine | Admitting: Physical Therapy

## 2023-03-14 DIAGNOSIS — R252 Cramp and spasm: Secondary | ICD-10-CM | POA: Insufficient documentation

## 2023-03-14 DIAGNOSIS — M25552 Pain in left hip: Secondary | ICD-10-CM | POA: Insufficient documentation

## 2023-03-14 DIAGNOSIS — M25512 Pain in left shoulder: Secondary | ICD-10-CM | POA: Insufficient documentation

## 2023-03-28 ENCOUNTER — Ambulatory Visit: Payer: Medicaid Other | Admitting: Physical Therapy

## 2023-04-01 DIAGNOSIS — Z419 Encounter for procedure for purposes other than remedying health state, unspecified: Secondary | ICD-10-CM | POA: Diagnosis not present

## 2023-05-01 DIAGNOSIS — Z419 Encounter for procedure for purposes other than remedying health state, unspecified: Secondary | ICD-10-CM | POA: Diagnosis not present

## 2023-06-01 DIAGNOSIS — Z419 Encounter for procedure for purposes other than remedying health state, unspecified: Secondary | ICD-10-CM | POA: Diagnosis not present

## 2023-07-02 DIAGNOSIS — Z419 Encounter for procedure for purposes other than remedying health state, unspecified: Secondary | ICD-10-CM | POA: Diagnosis not present

## 2023-08-01 DIAGNOSIS — Z419 Encounter for procedure for purposes other than remedying health state, unspecified: Secondary | ICD-10-CM | POA: Diagnosis not present

## 2023-10-01 DIAGNOSIS — Z419 Encounter for procedure for purposes other than remedying health state, unspecified: Secondary | ICD-10-CM | POA: Diagnosis not present

## 2023-10-11 ENCOUNTER — Ambulatory Visit (INDEPENDENT_AMBULATORY_CARE_PROVIDER_SITE_OTHER): Payer: Medicaid Other

## 2023-10-11 ENCOUNTER — Other Ambulatory Visit: Payer: Self-pay

## 2023-10-11 ENCOUNTER — Ambulatory Visit (HOSPITAL_COMMUNITY)
Admission: EM | Admit: 2023-10-11 | Discharge: 2023-10-11 | Disposition: A | Discharge status: Home / Self Care | Payer: Medicaid Other | Attending: Emergency Medicine | Admitting: Emergency Medicine

## 2023-10-11 ENCOUNTER — Encounter (HOSPITAL_COMMUNITY): Payer: Self-pay | Admitting: *Deleted

## 2023-10-11 DIAGNOSIS — M25551 Pain in right hip: Secondary | ICD-10-CM

## 2023-10-11 DIAGNOSIS — H9202 Otalgia, left ear: Secondary | ICD-10-CM | POA: Diagnosis not present

## 2023-10-11 DIAGNOSIS — M545 Low back pain, unspecified: Secondary | ICD-10-CM | POA: Diagnosis not present

## 2023-10-11 DIAGNOSIS — W19XXXA Unspecified fall, initial encounter: Secondary | ICD-10-CM | POA: Diagnosis not present

## 2023-10-11 DIAGNOSIS — R0981 Nasal congestion: Secondary | ICD-10-CM

## 2023-10-11 MED ORDER — IBUPROFEN 800 MG PO TABS: 800.0000 mg | ORAL_TABLET | Freq: Three times a day (TID) | ORAL | 0 refills | Status: DC

## 2023-10-11 MED ORDER — METHOCARBAMOL 500 MG PO TABS: 500.0000 mg | ORAL_TABLET | Freq: Two times a day (BID) | ORAL | 0 refills | Status: DC

## 2023-10-11 MED ORDER — IBUPROFEN 800 MG PO TABS
ORAL_TABLET | ORAL | Status: AC
  Filled 2023-10-11: qty 1

## 2023-10-11 MED ORDER — GUAIFENESIN ER 600 MG PO TB12: 600.0000 mg | ORAL_TABLET | Freq: Two times a day (BID) | ORAL | 0 refills | Status: AC

## 2023-10-11 MED ORDER — IBUPROFEN 800 MG PO TABS
800.0000 mg | ORAL_TABLET | Freq: Once | ORAL | Status: AC
  Administered 2023-10-11: 800 mg via ORAL

## 2023-10-11 NOTE — Discharge Instructions (Signed)
Use the Mucinex to help with your nasal congestion, this should help with your ear pain.  You can also sleep with a humidifier to help loosen up any secretions.  Take the ibuprofen 3 times daily with food to help with pain and inflammation.  You can also use the Robaxin as needed for muscle spasms, this may cause drowsiness.  Your symptoms should improve over the next 5 to 7 days.  If no improvement or any changes return to clinic or follow-up with sports medicine for any muscular pain.

## 2023-10-11 NOTE — ED Provider Notes (Signed)
[facility]    CSN: [id number] Arrival date & time: 10/11/23  0830      History   Chief Complaint Chief Complaint  Patient presents with   Fall   Otalgia    HPI [name] is a 57 y.o. female.   Patient presents to clinic with left ear pain that started on Monday.  She feels like the pain starts in her tongue and goes sore throat.  She has had a sore throat and some congestion.  A mild cough.  No fever.  No drainage from the ear.  She has not tried any interventions for the ear pain.  She also comes in for right-sided lower back and hip pain.  She slipped on a wet floor today and landed kind of backwards on the right side of her body.  She did sit in a chair for a while after the fall.  She is having pain with ambulating.  Pain in the hip area.  No knee pain.  She took a Tylenol prior to arrival.  The fall happened around 7:20am this morning.  No swelling or bruising anywhere.   The history is provided by the patient and medical records.  Fall  Otalgia   Past Medical History:  Diagnosis Date   Abdominal pain, epigastric 06/11/2015   Abdominal ultrasound, abnormal    normal   Abnormal abdominal CT scan 01/20/2005   and pelvic CT, normal    Allergic dermatitis 09/17/2015   ALLERGIC RHINITIS WITH CONJUNCTIVITIS 06/30/2009   Qualifier: Diagnosis of  By: Delrae Alfred MD, Elizabeth     Amenorrhea 03/29/2013   Anxiety with somatization 03/12/2013   Arthralgia 05/05/2014   Cervical strain 04/07/2015   Chronic headaches 01/06/2014   Dandruff 01/06/2014   Depressed    Diabetes mellitus (HCC) 01/17/2013   Diabetes mellitus without complication (HCC)    Dysuria 12/19/2012   Eczema 04/04/2017   Fatigue 04/04/2017   Fatty liver    History DEPRESSION 03/10/2007   Qualifier: Diagnosis of  By: Valinda Hoar tech, Victorino Dike     History of barium enema    normal   History of pelvic ultrasound 12/06   normal    Lactose intolerance 08/13/2015   Left axillary pain 02/12/2013   Left  genital labial abscess 06/05/2014   Lower abdominal pain 12/07/2012   MASTALGIA 03/10/2007   Qualifier: Diagnosis of  By: Valinda Hoar tech, Jennifer     Pain, dental 12/08/2016   Right upper quadrant abdominal pain 01/17/2013   Shoulder pain, left 09/04/2012   Sinus congestion 03/12/2013   Sinusitis, chronic 09/30/2013   Somatic complaints, multiple    SYMPTOM, PAIN, ABDOMINAL, OTH SPECIFIED SITE 01/10/2005   Qualifier: Diagnosis of  By: Valinda Hoar tech, Jennifer     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 Ladona Ridgel at [facility]. Last visit was 2012. Instructed that if pt develops palpitations, then catheter ablation would be recommended.       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 Authorizing Provider  atorvastatin (LIPITOR) 20 MG tablet Take 20 mg by mouth daily.   Yes [provider]  guaiFENesin (MUCINEX) 600 MG 12 hr tablet Take 1 tablet (600 mg total) by mouth 2 (two) times daily for 5 days. 10/11/23 10/16/23 Yes Rinaldo Ratel, [place] N, FNP  ibuprofen (ADVIL) 800 MG tablet Take 1 tablet (800 mg total) by mouth 3 (three) times daily. 10/11/23  Yes Rinaldo Ratel, [place] N, FNP  metFORMIN (GLUCOPHAGE) 1000 MG tablet Take 1 tablet (1,000 mg total)  by mouth 2 (two) times daily with a meal. 06/07/17  Yes Arvilla Market, MD  methocarbamol (ROBAXIN) 500 MG tablet Take 1 tablet (500 mg total) by mouth 2 (two) times daily. 10/11/23  Yes Rinaldo Ratel, [place] N, FNP    Family History Family History  Problem Relation Age of Onset   Throat cancer Father        non-smoker   Diabetes Mother    Hypertension Mother     Social History Social History   Tobacco Use   Smoking status: Never   Smokeless tobacco: Never  Substance Use Topics   Alcohol use: No    Alcohol/week: 0.0 standard drinks of alcohol   Drug use: No     Allergies   Amoxicillin   Review of Systems Review of Systems  Per HPI   Physical Exam Triage Vital Signs ED Triage Vitals  Encounter Vitals Group     BP 10/11/23 1019 138/84     Systolic BP Percentile --      Diastolic BP Percentile --      Pulse Rate 10/11/23 1019 60     Resp 10/11/23 1019 18     Temp 10/11/23 1019 97.9 F (36.6 C)     Temp src --      SpO2 10/11/23 1019 98 %     Weight --      Height --      Head Circumference --      Peak Flow --      Pain Score 10/11/23 1014 8     Pain Loc --      Pain Education --      Exclude from Growth Chart --    No data found.  Updated Vital Signs BP 138/84   Pulse 60   Temp 97.9 F (36.6 C)   Resp 18   SpO2 98%   Visual Acuity Right Eye Distance:   Left Eye Distance:   Bilateral Distance:    Right Eye Near:   Left Eye Near:    Bilateral Near:     Physical Exam Vitals and nursing note reviewed.  Constitutional:      Appearance: Normal appearance.  HENT:     Head: Normocephalic and atraumatic.     Right Ear: Tympanic membrane, ear canal and external ear normal.     Left Ear: Tympanic membrane, ear canal and external ear normal.     Nose: Congestion present.  Mouth/Throat:     Mouth: Mucous membranes are moist.  Eyes:     Conjunctiva/sclera: Conjunctivae normal.  Cardiovascular:     Rate and Rhythm: Normal rate and  regular rhythm.     Heart sounds: Normal heart sounds. No murmur heard. Pulmonary:     Effort: Pulmonary effort is normal. No respiratory distress.     Breath sounds: Normal breath sounds.  Musculoskeletal:        General: Tenderness and signs of injury present. No swelling or deformity. Normal range of motion.       Legs:     Comments: Right sided muscular lumbar back pain that goes into the hip.  Tenderness to palpation.  Lumbar spine without step-off or deformity, no tenderness to palpation.  No numbness or tingling.  Skin:    General: Skin is warm and dry.  Neurological:     General: No focal deficit present.     Mental Status: She is alert.  Psychiatric:        Mood and Affect: Mood normal.      UC Treatments / Results  Labs (all labs ordered are listed, but only abnormal results are displayed) Labs Reviewed - No data to display  EKG   Radiology DG Hip Unilat With Pelvis 2-3 Views Right  Result Date: 10/11/2023 CLINICAL DATA:  Fall, right hip pain EXAM: DG HIP (WITH OR WITHOUT PELVIS) 2-3V RIGHT COMPARISON:  06/19/2020 FINDINGS: There is no evidence of hip fracture or dislocation. There is no evidence of arthropathy or other focal bone abnormality. IMPRESSION: No acute or significant abnormality by plain radiography Electronically Signed   By: Judie Petit.  Shick M.D.   On: 10/11/2023 11:30    Procedures Procedures (including critical care time)  Medications Ordered in UC Medications  ibuprofen (ADVIL) tablet 800 mg (800 mg Oral Given 10/11/23 1105)    Initial Impression / Assessment and Plan / UC Course  I have reviewed the triage vital signs and the nursing notes.  Pertinent labs & imaging results that were available during my care of the patient were reviewed by me and considered in my medical decision making (see chart for details).  Vitals and triage reviewed, patient is hemodynamically stable.  Left-sided tympanic membrane with erythema, no bulging.  Suspect otalgia  due to congestion.  Advised Mucinex.  Right-sided lower back and hip pain that is tender to muscular lumbar palpation and right hip palpation.  She is ambulatory with pain.  Imaging does not show any acute fractures or deformities.  Advised anti-inflammatory, will trial muscle relaxer as needed.  Plan of care, follow-up care and return precautions given, no questions at this time.     Final Clinical Impressions(s) / UC Diagnoses   Final diagnoses:  Left ear pain  Nasal congestion  Pain in right lumbar region of back  Right hip pain  Fall, initial encounter     Discharge Instructions      Use the Mucinex to help with your nasal congestion, this should help with your ear pain.  You can also sleep with a humidifier to help loosen up any secretions.  Take the ibuprofen 3 times daily with food to help with pain and inflammation.  You can also use the Robaxin as needed for muscle spasms, this may cause drowsiness.  Your symptoms should improve over the next 5 to 7 days.  If no improvement or any changes return to clinic or follow-up with sports medicine for any muscular pain.     ED  Prescriptions     Medication Sig Dispense Auth. Provider   guaiFENesin (MUCINEX) 600 MG 12 hr tablet Take 1 tablet (600 mg total) by mouth 2 (two) times daily for 5 days. 10 tablet Rinaldo Ratel, [place] N, [place]   ibuprofen (ADVIL) 800 MG tablet Take 1 tablet (800 mg total) by mouth 3 (three) times daily. 21 tablet Rinaldo Ratel, [place] N, [place]   methocarbamol (ROBAXIN) 500 MG tablet Take 1 tablet (500 mg total) by mouth 2 (two) times daily. 20 tablet Aalia Greulich, [place] N, [place]      PDMP not reviewed this encounter.   Althia Egolf, [place] N, [place] 10/11/23 [phone]

## 2023-10-11 NOTE — ED Triage Notes (Signed)
Pt reports she fell due to water on floor today. Pt has pain in RT hip and RT leg.

## 2023-10-11 NOTE — ED Triage Notes (Signed)
Pt added she has Lt ear problem .

## 2023-11-01 DIAGNOSIS — Z419 Encounter for procedure for purposes other than remedying health state, unspecified: Secondary | ICD-10-CM | POA: Diagnosis not present

## 2023-12-02 DIAGNOSIS — Z419 Encounter for procedure for purposes other than remedying health state, unspecified: Secondary | ICD-10-CM | POA: Diagnosis not present

## 2023-12-12 ENCOUNTER — Ambulatory Visit (INDEPENDENT_AMBULATORY_CARE_PROVIDER_SITE_OTHER): Payer: Medicaid Other | Admitting: Nurse Practitioner

## 2023-12-12 ENCOUNTER — Encounter: Payer: Self-pay | Admitting: Nurse Practitioner

## 2023-12-12 VITALS — BP 143/59 | HR 67 | Temp 97.0°F | Ht 64.0 in | Wt 143.4 lb

## 2023-12-12 DIAGNOSIS — I10 Essential (primary) hypertension: Secondary | ICD-10-CM

## 2023-12-12 DIAGNOSIS — E785 Hyperlipidemia, unspecified: Secondary | ICD-10-CM

## 2023-12-12 DIAGNOSIS — H9192 Unspecified hearing loss, left ear: Secondary | ICD-10-CM | POA: Insufficient documentation

## 2023-12-12 DIAGNOSIS — H9202 Otalgia, left ear: Secondary | ICD-10-CM

## 2023-12-12 DIAGNOSIS — E1165 Type 2 diabetes mellitus with hyperglycemia: Secondary | ICD-10-CM | POA: Diagnosis not present

## 2023-12-12 DIAGNOSIS — J309 Allergic rhinitis, unspecified: Secondary | ICD-10-CM | POA: Diagnosis not present

## 2023-12-12 DIAGNOSIS — M25551 Pain in right hip: Secondary | ICD-10-CM | POA: Diagnosis not present

## 2023-12-12 LAB — POCT GLYCOSYLATED HEMOGLOBIN (HGB A1C): Hemoglobin A1C: 8.6 % — AB (ref 4.0–5.6)

## 2023-12-12 MED ORDER — FLUTICASONE PROPIONATE 50 MCG/ACT NA SUSP: 2.0000 | Freq: Every day | NASAL | 3 refills | Status: AC

## 2023-12-12 MED ORDER — VALSARTAN 40 MG PO TABS: 40.0000 mg | ORAL_TABLET | Freq: Every day | ORAL | 0 refills | Status: DC

## 2023-12-12 MED ORDER — LORATADINE 10 MG PO TABS: 10.0000 mg | ORAL_TABLET | Freq: Every day | ORAL | 11 refills | Status: AC

## 2023-12-12 MED ORDER — SALINE SPRAY 0.65 % NA SOLN: 1.0000 | NASAL | 0 refills | Status: AC | PRN

## 2023-12-12 MED ORDER — IBUPROFEN 800 MG PO TABS
800.0000 mg | ORAL_TABLET | Freq: Three times a day (TID) | ORAL | 0 refills | Status: DC | PRN
Start: 2023-12-12 — End: 2024-07-26

## 2023-12-12 MED ORDER — EMPAGLIFLOZIN 25 MG PO TABS
25.0000 mg | ORAL_TABLET | Freq: Every day | ORAL | 1 refills | Status: DC
Start: 2023-12-12 — End: 2024-03-12

## 2023-12-12 MED ORDER — PIOGLITAZONE HCL 45 MG PO TABS: 45.0000 mg | ORAL_TABLET | Freq: Every day | ORAL | 1 refills | Status: DC

## 2023-12-12 MED ORDER — METFORMIN HCL 1000 MG PO TABS: 1000.0000 mg | ORAL_TABLET | Freq: Two times a day (BID) | ORAL | 3 refills | Status: DC

## 2023-12-12 NOTE — Assessment & Plan Note (Signed)
Patient referred to ENT.

## 2023-12-12 NOTE — Patient Instructions (Addendum)
Goal for fasting blood sugar ranges from 80 to 120 and 2 hours after any meal or at bedtime should be between 130 to 170.    Please consider getting Shingrix , pneumococcal vaccine, influenza and Tdap vaccine at local pharmacy.     Around 3 times per week, check your blood pressure 2 times per day. once in the morning and once in the evening. The readings should be at least one minute apart. Write down these values and bring them to your next nurse visit/appointment.  When you check your BP, make sure you have been doing something calm/relaxing 5 minutes prior to checking. Both feet should be flat on the floor and you should be sitting. Use your left arm and make sure it is in a relaxed position (on a table), and that the cuff is at the approximate level/height of your heart.   Blood pressure goal is less than 130/80  1. Uncontrolled type 2 diabetes mellitus with hyperglycemia (HCC) (Primary)  - POCT glycosylated hemoglobin (Hb A1C) - CMP14+EGFR; Future - Microalbumin / creatinine urine ratio; Future - metFORMIN (GLUCOPHAGE) 1000 MG tablet; Take 1 tablet (1,000 mg total) by mouth 2 (two) times daily with a meal.  Dispense: 180 tablet; Refill: 3 - empagliflozin (JARDIANCE) 25 MG TABS tablet; Take 1 tablet (25 mg total) by mouth daily.  Dispense: 90 tablet; Refill: 1 - pioglitazone (ACTOS) 45 MG tablet; Take 1 tablet (45 mg total) by mouth daily.  Dispense: 90 tablet; Refill: 1     Left ear pain  - Ambulatory referral to ENT  . Hearing trouble, left  - Ambulatory referral to ENT  . Allergic rhinitis, unspecified seasonality, unspecified trigger  - fluticasone (FLONASE) 50 MCG/ACT nasal spray; Place 2 sprays into both nostrils daily.  Dispense: 11.1 mL; Refill: 3 - loratadine (CLARITIN) 10 MG tablet; Take 1 tablet (10 mg total) by mouth daily.  Dispense: 30 tablet; Refill: 11 - sodium chloride (OCEAN) 0.65 % SOLN nasal spray; Place 1 spray into both nostrils as needed.  Dispense: 30  mL; Refill: 0  . Right hip pain  - ibuprofen (ADVIL) 800 MG tablet; Take 1 tablet (800 mg total) by mouth every 8 (eight) hours as needed.  Dispense: 30 tablet; Refill: 0 please take ibuprofen with food .  Alternate with Tylenol 650 mg every 6 hours as needed.  I also encourage application of heating pad.   Primary hypertension  - valsartan (DIOVAN) 40 MG tablet; Take 1 tablet (40 mg total) by mouth daily.  Dispense: 90 tablet; Refill: 0      It is important that you exercise regularly at least 30 minutes 5 times a week as tolerated  Think about what you will eat, plan ahead. Choose " clean, green, fresh or frozen" over canned, processed or packaged foods which are more sugary, salty and fatty. 70 to 75% of food eaten should be vegetables and fruit. Three meals at set times with snacks allowed between meals, but they must be fruit or vegetables. Aim to eat over a 12 hour period , example 7 am to 7 pm, and STOP after  your last meal of the day. Drink water,generally about 64 ounces per day, no other drink is as healthy. Fruit juice is best enjoyed in a healthy way, by EATING the fruit.  Thanks for choosing [facility] we consider it a privelige to serve you.

## 2023-12-12 NOTE — Assessment & Plan Note (Signed)
Patient complains of chronic stuffy nose, rhinitis, nonproductive cough, scratchy throat. Using Flonase nasal spray as needed  - fluticasone (FLONASE) 50 MCG/ACT nasal spray; Place 2 sprays into both nostrils daily.  Dispense: 11.1 mL; Refill: 3 - loratadine (CLARITIN) 10 MG tablet; Take 1 tablet (10 mg total) by mouth daily.  Dispense: 30 tablet; Refill: 11 - sodium chloride (OCEAN) 0.65 % SOLN nasal spray; Place 1 spray into both nostrils as needed.  Dispense: 30 mL; Refill: 0

## 2023-12-12 NOTE — Assessment & Plan Note (Signed)
Chronic condition, patient referred to ENT

## 2023-12-12 NOTE — Progress Notes (Signed)
See notes below

## 2023-12-12 NOTE — Assessment & Plan Note (Addendum)
Start valsartan 40 mg daily Encouraged to monitor blood pressure at home, blood sugar goal is less than 130/80, needs better blood pressure monitor, referral sent to the clinical pharmacist. DASH diet and commitment to daily physical activity for a minimum of 30 minutes discussed and encouraged, as a part of hypertension management. The importance of attaining a healthy weight is also discussed.     12/12/2023    9:38 AM 12/12/2023    9:19 AM 12/12/2023    9:02 AM 10/11/2023   10:19 AM 12/22/2021    5:56 PM 06/18/2019    3:41 PM 01/10/2019    3:50 PM  BP/Weight  Systolic BP 143 136 141 138 149 158 130  Diastolic BP 59 68 70 84 85 76 80  Wt. (Lbs)   143.4    135  BMI   24.61 kg/m2    23.17 kg/m2

## 2023-12-12 NOTE — Assessment & Plan Note (Signed)
Ibuprofen 800 mg 3 times daily as needed refilled, to alternate with Tylenol, use of heat pad also encouraged

## 2023-12-12 NOTE — Progress Notes (Signed)
 New Patient Office Visit  Subjective:  Patient ID: [name], female    DOB: 23-Mar-1966  Age: 58 y.o. MRN: [id number]  CC:  Chief Complaint  Patient presents with   Establish Care    HPI [name] is a 58 y.o. 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), Arthralgia (05/05/2014), Cervical strain (04/07/2015), Chronic headaches (01/06/2014), Dandruff (01/06/2014), Depressed, Diabetes mellitus (HCC) (01/17/2013), Diabetes mellitus without complication (HCC), Dysuria (12/19/2012), Eczema (04/04/2017), Fatigue (04/04/2017), Fatty liver, History DEPRESSION (03/10/2007), History of barium enema, History of pelvic ultrasound (09/2005), Hyperlipidemia, Hypertension, Lactose intolerance (08/13/2015), Left axillary pain (02/12/2013), Left genital labial abscess (06/05/2014), Lower abdominal pain (12/07/2012), MASTALGIA (03/10/2007), Pain, dental (12/08/2016), Right upper quadrant abdominal pain (01/17/2013), Shoulder pain, left (09/04/2012), Sinus congestion (03/12/2013), Sinusitis, chronic (09/30/2013), Somatic complaints, multiple, SYMPTOM, PAIN, ABDOMINAL, OTH SPECIFIED SITE (01/10/2005), and 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  Patient presented establish care for her chronic medical conditions.  Previous PCP was at [facility], last visit today was 6 months ago.  Patient is accompanied by her spouse   Uncontrolled type 2 diabetes.  Currently on Jardiance 25 mg daily, ran out off metformin 1000 mg twice daily and Actos 45 mg daily tablets since about 1 month ago.  She denies polyphagia polyuria polydipsia.  Has simvastatin 80 mg daily ordered but she has not been taking the medication consistently because it  makes her feel sleepy.  Last eye exam was March 2024.  Hypertension.  Has valsartan 160 mg daily ordered but she has not been taking the medication, is like too much medication gives her heartburn.  No complaints of chest pain shortness of breath edema  Right hip pain patient complains of right hip pain since December after she slipped on a wet floor.  Takes ibuprofen as needed that helps, states that her pain is worse with prolonged sitting.  She was seen at the emergency room at that time and her x-ray of the hip was normal.  She denies fever, abdominal pain ,dysuria, cloudy urine.  Sensorineural hearing loss /left ear pain .patient complains of chronic left ear pain, trouble hearing in the left ear for years.  She was evaluated by ENT and audiology in 2020 for hearing loss. there was a recommendation for annual hearing evaluation.  Patient denies fever, ear drainage              Past Medical History:  Diagnosis Date   Abdominal pain, epigastric 06/11/2015   Abdominal ultrasound, abnormal    normal   Abnormal abdominal CT scan 01/20/2005   and pelvic CT, normal    Allergic dermatitis 09/17/2015   ALLERGIC RHINITIS WITH CONJUNCTIVITIS 06/30/2009   Qualifier: Diagnosis of  By: Delrae Alfred MD, Elizabeth     Amenorrhea 03/29/2013   Anxiety with somatization 03/12/2013   Arthralgia 05/05/2014   Cervical strain 04/07/2015   Chronic headaches 01/06/2014   Dandruff 01/06/2014   Depressed    Diabetes mellitus (HCC) 01/17/2013   Diabetes mellitus without complication (HCC)    Dysuria 12/19/2012   Eczema 04/04/2017   Fatigue 04/04/2017   Fatty liver    History DEPRESSION 03/10/2007   Qualifier: Diagnosis of  By: Valinda Hoar tech, Victorino Dike     History of barium enema    normal   History of pelvic ultrasound 09/2005   normal  Hyperlipidemia    Hypertension    Lactose intolerance 08/13/2015   Left axillary pain 02/12/2013   Left genital labial abscess 06/05/2014   Lower  abdominal pain 12/07/2012   MASTALGIA 03/10/2007   Qualifier: Diagnosis of  By: Valinda Hoar tech, Jennifer     Pain, dental 12/08/2016   Right upper quadrant abdominal pain 01/17/2013   Shoulder pain, left 09/04/2012   Sinus congestion 03/12/2013   Sinusitis, chronic 09/30/2013   Somatic complaints, multiple    SYMPTOM, PAIN, ABDOMINAL, OTH SPECIFIED SITE 01/10/2005   Qualifier: Diagnosis of  By: Valinda Hoar tech, Royce Macadamia (WOLFE)-PARKINSON-WHITE (WPW) SYNDROME 11/04/2010   Follows with Dr. Ladona Ridgel at [facility]. Last visit was 2012. Instructed that if pt develops palpitations, then catheter ablation would be recommended.       Past Surgical History:  Procedure Laterality Date   APPENDECTOMY     caesarean section     x2   CHOLECYSTECTOMY  2009    Family History  Problem Relation Age of Onset   Throat cancer Father        non-smoker   Diabetes Mother    Hypertension Mother     Social History   Socioeconomic History   Marital status: Married    Spouse name: Not on file   Number of children: 3   Years of education: Not on file   Highest education level: Not on file  Occupational History   Not on file  Tobacco Use   Smoking status: Never   Smokeless tobacco: Never  Substance and Sexual Activity   Alcohol use: No    Alcohol/week: 0.0 standard drinks of alcohol   Drug use: No   Sexual activity: Not Currently  Other Topics Concern   Not on file  Social History Narrative   Originally from [place]. 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 Lives at home with husband and 3 children.    Social Drivers of Corporate investment banker Strain: Not on file  Food Insecurity: Not on file  Transportation Needs: Not on file  Physical Activity: Not on file  Stress: Not on file  Social Connections: Not on file  Intimate Partner Violence: Not on file    ROS Review of Systems  Constitutional:  Negative for appetite change,  chills, fatigue and fever.  HENT:  Positive for congestion, ear pain and sneezing. Negative for postnasal drip and rhinorrhea.   Respiratory:  Negative for cough, shortness of breath and wheezing.   Cardiovascular:  Negative for chest pain, palpitations and leg swelling.  Gastrointestinal:  Negative for abdominal pain, constipation, nausea and vomiting.  Genitourinary:  Negative for difficulty urinating, dysuria, flank pain and frequency.  Musculoskeletal:  Positive for arthralgias. Negative for back pain, joint swelling and myalgias.  Skin:  Negative for color change, pallor, rash and wound.  Neurological:  Negative for dizziness, facial asymmetry, weakness, numbness and headaches.  Psychiatric/Behavioral:  Negative for behavioral problems, confusion, self-injury and suicidal ideas.     Objective:   Today's Vitals: BP (!) 143/59   Pulse 67   Temp (!) 97 F (36.1 C)   Ht 5\' 4"  (1.626 m)   Wt 143 lb 6.4 oz (65 kg)   SpO2 100%   BMI 24.61 kg/m   Physical Exam Vitals and nursing note reviewed.  Constitutional:      General: She is not in acute distress.    Appearance: Normal appearance. She is not  ill-appearing, toxic-appearing or diaphoretic.  HENT:     Right Ear: Tympanic membrane, ear canal and external ear normal. There is no impacted cerumen.     Left Ear: Tympanic membrane, ear canal and external ear normal. There is no impacted cerumen.     Nose: Congestion and rhinorrhea present.     Right Turbinates: Enlarged.     Left Turbinates: Enlarged.     Mouth/Throat:     Mouth: Mucous membranes are moist.     Pharynx: Oropharynx is clear. No oropharyngeal exudate or posterior oropharyngeal erythema.     Tonsils: 0 on the right. 0 on the left.  Eyes:     General: No scleral icterus.       Right eye: No discharge.        Left eye: No discharge.     Extraocular Movements: Extraocular movements intact.     Conjunctiva/sclera: Conjunctivae normal.  Cardiovascular:     Rate and  Rhythm: Normal rate and regular rhythm.     Pulses: Normal pulses.     Heart sounds: Normal heart sounds. No murmur heard.    No friction rub. No gallop.  Pulmonary:     Effort: Pulmonary effort is normal. No respiratory distress.     Breath sounds: Normal breath sounds. No stridor. No wheezing, rhonchi or rales.  Chest:     Chest wall: No tenderness.  Abdominal:     General: There is no distension.     Palpations: Abdomen is soft.     Tenderness: There is no abdominal tenderness. There is no right CVA tenderness, left CVA tenderness or guarding.  Musculoskeletal:        General: Tenderness present. No swelling, deformity or signs of injury.     Right lower leg: No edema.     Left lower leg: No edema.     Comments: Tenderness on palpation of right hip, patient able to ambulate without difficulty  Skin:    General: Skin is warm and dry.     Capillary Refill: Capillary refill takes less than 2 seconds.     Coloration: Skin is not jaundiced or pale.     Findings: No bruising, erythema or lesion.  Neurological:     Mental Status: She is alert and oriented to person, place, and time.     Motor: No weakness.     Coordination: Coordination normal.     Gait: Gait normal.  Psychiatric:        Mood and Affect: Mood normal.        Behavior: Behavior normal.        Thought Content: Thought content normal.        Judgment: Judgment normal.     Assessment & Plan:   Problem List Items Addressed This Visit       Cardiovascular and Mediastinum   High blood pressure   Start valsartan 40 mg daily Encouraged to monitor blood pressure at home, blood sugar goal is less than 130/80, needs better blood pressure monitor, referral sent to the clinical pharmacist. DASH diet and commitment to daily physical activity for a minimum of 30 minutes discussed and encouraged, as a part of hypertension management. The importance of attaining a healthy weight is also discussed.     12/12/2023    9:38 AM  12/12/2023    9:19 AM 12/12/2023    9:02 AM 10/11/2023   10:19 AM 12/22/2021    5:56 PM 06/18/2019    3:41 PM 01/10/2019  3:50 PM  BP/Weight  Systolic BP 143 136 141 138 149 158 130  Diastolic BP 59 68 70 84 85 76 80  Wt. (Lbs)   143.4    135  BMI   24.61 kg/m2    23.17 kg/m2           Relevant Medications   simvastatin (ZOCOR) 80 MG tablet   valsartan (DIOVAN) 40 MG tablet   Other Relevant Orders   AMB Referral [facility]I Care Management     Respiratory   Allergic rhinitis   Patient complains of chronic stuffy nose, rhinitis, nonproductive cough, scratchy throat. Using Flonase nasal spray as needed  - fluticasone (FLONASE) 50 MCG/ACT nasal spray; Place 2 sprays into both nostrils daily.  Dispense: 11.1 mL; Refill: 3 - loratadine (CLARITIN) 10 MG tablet; Take 1 tablet (10 mg total) by mouth daily.  Dispense: 30 tablet; Refill: 11 - sodium chloride (OCEAN) 0.65 % SOLN nasal spray; Place 1 spray into both nostrils as needed.  Dispense: 30 mL; Refill: 0       Relevant Medications   fluticasone (FLONASE) 50 MCG/ACT nasal spray   loratadine (CLARITIN) 10 MG tablet   sodium chloride (OCEAN) 0.65 % SOLN nasal spray     Endocrine   Uncontrolled type 2 diabetes mellitus with hyperglycemia (HCC) - Primary   Lab Results  Component Value Date   HGBA1C 7.2 (H) 09/05/2018  A1c 8.3 today Continue Jardiance 25 mg daily, metformin 1000 mg twice daily and Actos 45 mg daily refilled Patient counseled on low-carb diet, encouraged to engage in regular moderate exercise as tolerated, CBG goals discussed, encouraged to get her diabetic eye exam completed Checking CMP, urine microalbumin labs and lipid panel      Relevant Medications   simvastatin (ZOCOR) 80 MG tablet   metFORMIN (GLUCOPHAGE) 1000 MG tablet   empagliflozin (JARDIANCE) 25 MG TABS tablet   pioglitazone (ACTOS) 45 MG tablet   valsartan (DIOVAN) 40 MG tablet   Other Relevant Orders   POCT glycosylated hemoglobin (Hb A1C)    CMP14+EGFR   Microalbumin / creatinine urine ratio   Lipid panel     Nervous and Auditory   Hearing trouble, left   Chronic condition, patient referred to ENT      Relevant Orders   Ambulatory referral to ENT     Other   Left ear pain   Patient referred to ENT       Relevant Orders   Ambulatory referral to ENT   Right hip pain   Ibuprofen 800 mg 3 times daily as needed refilled, to alternate with Tylenol, use of heat pad also encouraged       Relevant Medications   ibuprofen (ADVIL) 800 MG tablet   Dyslipidemia, goal LDL below 70   LDL goal is less than 70 Patient encouraged to take simvastatin 80 mg daily as ordered Checking lipid panel      Relevant Medications   simvastatin (ZOCOR) 80 MG tablet   valsartan (DIOVAN) 40 MG tablet    Outpatient Encounter Medications as of 12/12/2023  Medication Sig   loratadine (CLARITIN) 10 MG tablet Take 1 tablet (10 mg total) by mouth daily.   sodium chloride (OCEAN) 0.65 % SOLN nasal spray Place 1 spray into both nostrils as needed.   valsartan (DIOVAN) 40 MG tablet Take 1 tablet (40 mg total) by mouth daily.   [DISCONTINUED] atorvastatin (LIPITOR) 20 MG tablet Take 20 mg by mouth daily.   [DISCONTINUED] empagliflozin (JARDIANCE) 25 MG TABS  tablet Take by mouth daily.   [DISCONTINUED] fluticasone (FLONASE) 50 MCG/ACT nasal spray SPRAY 2 SPRAYS INTO EACH NOSTRIL EVERY DAY   [DISCONTINUED] metFORMIN (GLUCOPHAGE) 1000 MG tablet Take 1 tablet (1,000 mg total) by mouth 2 (two) times daily with a meal.   [DISCONTINUED] methocarbamol (ROBAXIN) 500 MG tablet Take 1 tablet (500 mg total) by mouth 2 (two) times daily.   [DISCONTINUED] pioglitazone (ACTOS) 45 MG tablet Take 45 mg by mouth daily.   empagliflozin (JARDIANCE) 25 MG TABS tablet Take 1 tablet (25 mg total) by mouth daily.   fluticasone (FLONASE) 50 MCG/ACT nasal spray Place 2 sprays into both nostrils daily.   ibuprofen (ADVIL) 800 MG tablet Take 1 tablet (800 mg total) by mouth  every 8 (eight) hours as needed.   metFORMIN (GLUCOPHAGE) 1000 MG tablet Take 1 tablet (1,000 mg total) by mouth 2 (two) times daily with a meal.   pioglitazone (ACTOS) 45 MG tablet Take 1 tablet (45 mg total) by mouth daily.   simvastatin (ZOCOR) 80 MG tablet Take 80 mg by mouth daily. (Patient not taking: Reported on 12/12/2023)   [DISCONTINUED] ibuprofen (ADVIL) 800 MG tablet Take 1 tablet (800 mg total) by mouth 3 (three) times daily. (Patient not taking: Reported on 12/12/2023)   [DISCONTINUED] valsartan (DIOVAN) 160 MG tablet Take 160 mg by mouth daily. (Patient not taking: Reported on 12/12/2023)   No facility-administered encounter medications on file as of 12/12/2023.    Follow-up: Return in about 4 weeks (around 01/09/2024) for HTN.   Donell Beers, FNP

## 2023-12-12 NOTE — Assessment & Plan Note (Signed)
Lab Results  Component Value Date   HGBA1C 7.2 (H) 09/05/2018  A1c 8.3 today Continue Jardiance 25 mg daily, metformin 1000 mg twice daily and Actos 45 mg daily refilled Patient counseled on low-carb diet, encouraged to engage in regular moderate exercise as tolerated, CBG goals discussed, encouraged to get her diabetic eye exam completed Checking CMP, urine microalbumin labs and lipid panel

## 2023-12-12 NOTE — Assessment & Plan Note (Signed)
LDL goal is less than 70 Patient encouraged to take simvastatin 80 mg daily as ordered Checking lipid panel

## 2023-12-18 ENCOUNTER — Other Ambulatory Visit: Payer: No Typology Code available for payment source

## 2023-12-18 DIAGNOSIS — E1165 Type 2 diabetes mellitus with hyperglycemia: Secondary | ICD-10-CM

## 2023-12-19 ENCOUNTER — Other Ambulatory Visit: Payer: Self-pay | Admitting: Nurse Practitioner

## 2023-12-19 LAB — CMP14+EGFR
ALT: 31 [IU]/L (ref 0–32)
AST: 25 [IU]/L (ref 0–40)
Albumin: 4.1 g/dL (ref 3.8–4.9)
Alkaline Phosphatase: 92 [IU]/L (ref 44–121)
BUN/Creatinine Ratio: 23 (ref 9–23)
BUN: 16 mg/dL (ref 6–24)
Bilirubin Total: 0.3 mg/dL (ref 0.0–1.2)
CO2: 22 mmol/L (ref 20–29)
Calcium: 9.4 mg/dL (ref 8.7–10.2)
Chloride: 104 mmol/L (ref 96–106)
Creatinine, Ser: 0.71 mg/dL (ref 0.57–1.00)
Globulin, Total: 3.3 g/dL (ref 1.5–4.5)
Glucose: 145 mg/dL — ABNORMAL HIGH (ref 70–99)
Potassium: 4.3 mmol/L (ref 3.5–5.2)
Sodium: 139 mmol/L (ref 134–144)
Total Protein: 7.4 g/dL (ref 6.0–8.5)
eGFR: 100 mL/min/{1.73_m2} (ref >59)

## 2023-12-19 LAB — MICROALBUMIN / CREATININE URINE RATIO
Creatinine, Urine: 128.3 mg/dL
Microalb/Creat Ratio: 29 mg/g{creat} (ref 0–29)
Microalbumin, Urine: 36.8 ug/mL

## 2023-12-19 LAB — LIPID PANEL
Chol/HDL Ratio: 4.9 {ratio} — ABNORMAL HIGH (ref 0.0–4.4)
Cholesterol, Total: 212 mg/dL — ABNORMAL HIGH (ref 100–199)
HDL: 43 mg/dL (ref >39)
LDL Chol Calc (NIH): 153 mg/dL — ABNORMAL HIGH (ref 0–99)
Triglycerides: 87 mg/dL (ref 0–149)
VLDL Cholesterol Cal: 16 mg/dL (ref 5–40)

## 2023-12-19 MED ORDER — SIMVASTATIN 80 MG PO TABS: 80.0000 mg | ORAL_TABLET | Freq: Every day | ORAL | 1 refills | Status: DC

## 2023-12-28 ENCOUNTER — Telehealth: Payer: Self-pay

## 2023-12-28 DIAGNOSIS — Z79899 Other long term (current) drug therapy: Secondary | ICD-10-CM

## 2023-12-28 NOTE — Progress Notes (Signed)
   12/28/2023  Patient ID: [name], female   DOB: 1965-12-07, 58 y.o.   MRN: [id number]  Attempted to contact patient for medication management/review. Left HIPAA compliant message for patient to return my call at their convenience.   First attempt for patient outreach. Will follow up with patient in 5-8 business days.  Thank you for allowing pharmacy to be a part of this patient's care.  Cephus Shelling, PharmD Clinical Pharmacist Cell: [phone]

## 2023-12-30 DIAGNOSIS — Z419 Encounter for procedure for purposes other than remedying health state, unspecified: Secondary | ICD-10-CM | POA: Diagnosis not present

## 2024-01-08 ENCOUNTER — Telehealth: Payer: Self-pay

## 2024-01-08 DIAGNOSIS — Z79899 Other long term (current) drug therapy: Secondary | ICD-10-CM

## 2024-01-08 DIAGNOSIS — Z9189 Other specified personal risk factors, not elsewhere classified: Secondary | ICD-10-CM

## 2024-01-08 NOTE — Progress Notes (Signed)
   01/08/2024  Patient ID: [name], female   DOB: 05/10/1966, 58 y.o.   MRN: [id number]  Attempted to contact patient for medication management/review. Left HIPAA compliant message for patient to return my call at their convenience.   Second attempt for patient outreach. Will follow up with patient in 7-10 business days.  Thank you for allowing pharmacy to be a part of this patient's care.  Cephus Shelling, PharmD Clinical Pharmacist Cell: [phone]

## 2024-01-09 ENCOUNTER — Ambulatory Visit (INDEPENDENT_AMBULATORY_CARE_PROVIDER_SITE_OTHER): Payer: Self-pay | Admitting: Nurse Practitioner

## 2024-01-09 ENCOUNTER — Encounter: Payer: Self-pay | Admitting: Nurse Practitioner

## 2024-01-09 VITALS — BP 138/55 | HR 76 | Temp 97.5°F | Wt 142.8 lb

## 2024-01-09 DIAGNOSIS — R5383 Other fatigue: Secondary | ICD-10-CM | POA: Diagnosis not present

## 2024-01-09 DIAGNOSIS — E1165 Type 2 diabetes mellitus with hyperglycemia: Secondary | ICD-10-CM

## 2024-01-09 DIAGNOSIS — Z1231 Encounter for screening mammogram for malignant neoplasm of breast: Secondary | ICD-10-CM | POA: Diagnosis not present

## 2024-01-09 DIAGNOSIS — E785 Hyperlipidemia, unspecified: Secondary | ICD-10-CM | POA: Diagnosis not present

## 2024-01-09 DIAGNOSIS — J309 Allergic rhinitis, unspecified: Secondary | ICD-10-CM

## 2024-01-09 DIAGNOSIS — Z1211 Encounter for screening for malignant neoplasm of colon: Secondary | ICD-10-CM | POA: Diagnosis not present

## 2024-01-09 DIAGNOSIS — L309 Dermatitis, unspecified: Secondary | ICD-10-CM

## 2024-01-09 DIAGNOSIS — R051 Acute cough: Secondary | ICD-10-CM

## 2024-01-09 DIAGNOSIS — I10 Essential (primary) hypertension: Secondary | ICD-10-CM | POA: Diagnosis not present

## 2024-01-09 LAB — POCT INFLUENZA A/B
Influenza A, POC: NEGATIVE
Influenza B, POC: NEGATIVE

## 2024-01-09 LAB — POC COVID19 BINAXNOW: SARS Coronavirus 2 Ag: NEGATIVE

## 2024-01-09 MED ORDER — BENZONATATE 100 MG PO CAPS: 100.0000 mg | ORAL_CAPSULE | Freq: Three times a day (TID) | ORAL | 0 refills | Status: DC | PRN

## 2024-01-09 NOTE — Patient Instructions (Addendum)
 Please come fasting to your next appointment so we can recheck your cholesterol level.  Make sure to take your blood pressure medication on the day of your appointment at least 2 hours before your appointment  Around 3 times per week, check your blood pressure 2 times per day. once in the morning and once in the evening. The readings should be at least one minute apart. Write down these values and bring them to your next nurse visit/appointment.  When you check your BP, make sure you have been doing something calm/relaxing 5 minutes prior to checking. Both feet should be flat on the floor and you should be sitting. Use your left arm and make sure it is in a relaxed position (on a table), and that the cuff is at the approximate level/height of your heart.  Blood pressure goal is less than 130/80  Goal for fasting blood sugar ranges from 80 to 120 and 2 hours after any meal or at bedtime should be between 130 to 170.    Marland Kitchen Fatigue, unspecified type (Primary)  - CBC - VITAMIN D 25 Hydroxy (Vit-D Deficiency, Fractures) - Vitamin B12  Screening mammogram for breast cancer  - MM Digital Screening; Future Please call [phone]   to schedule your mammogram.  [facility]. [address] [place], [place] [address]. [place].     Screening for colon cancer  - Ambulatory referral to Gastroenterology  . Acute cough  - benzonatate (TESSALON PERLES) 100 MG capsule; Take 1 capsule (100 mg total) by mouth 3 (three) times daily as needed for cough.  Dispense: 30 capsule; Refill: 0

## 2024-01-09 NOTE — Assessment & Plan Note (Signed)
 BP Readings from Last 3 Encounters:  01/09/24 (!) 138/55  12/12/23 (!) 143/59  10/11/23 138/84  Has a prescription for valsartan 40 mg daily but not taking the medication consistently due to her religious fasting Discussed DASH diet and dietary sodium restrictions Continue to increase dietary efforts and exercise.  Needs a BP cuff she was referred to the clinical pharmacist but they have not been able to reach her, the office phone number was provided today.

## 2024-01-09 NOTE — Assessment & Plan Note (Signed)
 Lab Results  Component Value Date   CHOL 212 (H) 12/18/2023   HDL 43 12/18/2023   LDLCALC 153 (H) 12/18/2023   TRIG 87 12/18/2023   CHOLHDL 4.9 (H) 12/18/2023  Continue simvastatin 80 mg daily, will recheck labs at next visit LDL goal is less than 70

## 2024-01-09 NOTE — Progress Notes (Signed)
 Established Patient Office Visit  Subjective:  Patient ID: [name], female    DOB: 1966-07-25  Age: 58 y.o. MRN: [id number]  CC:  Chief Complaint  Patient presents with   Hypertension    HPI [name] is a 58 y.o. 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), Arthralgia (05/05/2014), Cervical strain (04/07/2015), Chronic headaches (01/06/2014), Dandruff (01/06/2014), Depressed, Diabetes mellitus (HCC) (01/17/2013), Diabetes mellitus without complication (HCC), Dysuria (12/19/2012), Eczema (04/04/2017), Fatigue (04/04/2017), Fatty liver, History DEPRESSION (03/10/2007), History of barium enema, History of pelvic ultrasound (09/2005), Hyperlipidemia, Hypertension, Lactose intolerance (08/13/2015), Left axillary pain (02/12/2013), Left genital labial abscess (06/05/2014), Lower abdominal pain (12/07/2012), MASTALGIA (03/10/2007), Pain, dental (12/08/2016), Right upper quadrant abdominal pain (01/17/2013), Shoulder pain, left (09/04/2012), Sinus congestion (03/12/2013), Sinusitis, chronic (09/30/2013), Somatic complaints, multiple, SYMPTOM, PAIN, ABDOMINAL, OTH SPECIFIED SITE (01/10/2005), and 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   Patient presents for follow-up for hypertension  Hypertension.  Currently on valsartan 40 mg daily, stated that she has been taking this medication inconsistently due to the religious annual fasting.  She denies shortness of breath, chest pain, edema  Type 2 diabetes.  Currently taking metformin 1000 mg, Actos 45 mg and Jardiance 25 mg daily, states that she takes all medications in the evening when she is breaking her fast.  No polyphagia polyuria polydipsia  Acute cough Patient  complains of dry productive cough that started yesterday,  she reports feeling flushed but no fever, shortness of breath, chest pain.  Takes loratadine and  Flonase for allergy  Mammogram and colonoscopy ordered, will plan to do cervical cancer screening at her next appointment Records requested from her previous PCP at [facility] physician today.  Patient declined the use of an interpreter   Past Medical History:  Diagnosis Date   Abdominal pain, epigastric 06/11/2015   Abdominal ultrasound, abnormal    normal   Abnormal abdominal CT scan 01/20/2005   and pelvic CT, normal    Allergic dermatitis 09/17/2015   ALLERGIC RHINITIS WITH CONJUNCTIVITIS 06/30/2009   Qualifier: Diagnosis of  By: Delrae Alfred MD, Elizabeth     Amenorrhea 03/29/2013   Anxiety with somatization 03/12/2013   Arthralgia 05/05/2014   Cervical strain 04/07/2015   Chronic headaches 01/06/2014   Dandruff 01/06/2014   Depressed    Diabetes mellitus (HCC) 01/17/2013   Diabetes mellitus without complication (HCC)    Dysuria 12/19/2012   Eczema 04/04/2017   Fatigue 04/04/2017   Fatty liver    History DEPRESSION 03/10/2007   Qualifier: Diagnosis of  By: Valinda Hoar tech, Victorino Dike     History of barium enema    normal   History of pelvic ultrasound 09/2005   normal    Hyperlipidemia    Hypertension    Lactose intolerance 08/13/2015   Left axillary pain 02/12/2013   Left genital labial abscess 06/05/2014   Lower abdominal pain 12/07/2012   MASTALGIA 03/10/2007   Qualifier: Diagnosis of  By: Valinda Hoar tech, Jennifer     Pain, dental 12/08/2016   Right upper quadrant abdominal pain 01/17/2013   Shoulder pain, left 09/04/2012   Sinus congestion 03/12/2013   Sinusitis, chronic 09/30/2013   Somatic complaints, multiple    SYMPTOM, PAIN, ABDOMINAL, OTH SPECIFIED SITE 01/10/2005   Qualifier: Diagnosis of  By: Valinda Hoar tech, Jennifer     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  Follows with  Dr. Ladona Ridgel at [facility]. Last visit was 2012. Instructed that if pt develops palpitations, then catheter ablation would be recommended.       Past Surgical History:  Procedure Laterality Date   APPENDECTOMY     caesarean section     x2   CHOLECYSTECTOMY  2009    Family History  Problem Relation Age of Onset   Throat cancer Father        non-smoker   Diabetes Mother    Hypertension Mother     Social History   Socioeconomic History   Marital status: Married    Spouse name: Not on file   Number of children: 3   Years of education: Not on file   Highest education level: Not on file  Occupational History   Not on file  Tobacco Use   Smoking status: Never   Smokeless tobacco: Never  Substance and Sexual Activity   Alcohol use: No    Alcohol/week: 0.0 standard drinks of alcohol   Drug use: No   Sexual activity: Not Currently  Other Topics Concern   Not on file  Social History Narrative   Originally from [place]. 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 Lives at home with husband and 3 children.    Social Drivers of Corporate investment banker Strain: Not on file  Food Insecurity: Not on file  Transportation Needs: Not on file  Physical Activity: Not on file  Stress: Not on file  Social Connections: Not on file  Intimate Partner Violence: Not on file    Outpatient Medications Prior to Visit  Medication Sig Dispense Refill   empagliflozin (JARDIANCE) 25 MG TABS tablet Take 1 tablet (25 mg total) by mouth daily. 90 tablet 1   fluticasone (FLONASE) 50 MCG/ACT nasal spray Place 2 sprays into both nostrils daily. 11.1 mL 3   ibuprofen (ADVIL) 800 MG tablet Take 1 tablet (800 mg total) by mouth every 8 (eight) hours as needed. 30 tablet 0   loratadine (CLARITIN) 10 MG tablet Take 1 tablet (10 mg total) by mouth daily. 30 tablet 11   metFORMIN (GLUCOPHAGE) 1000 MG tablet Take 1 tablet (1,000 mg total) by mouth 2 (two) times daily with a  meal. 180 tablet 3   pioglitazone (ACTOS) 45 MG tablet Take 1 tablet (45 mg total) by mouth daily. 90 tablet 1   simvastatin (ZOCOR) 80 MG tablet Take 1 tablet (80 mg total) by mouth daily. 90 tablet 1   sodium chloride (OCEAN) 0.65 % SOLN nasal spray Place 1 spray into both nostrils as needed. 30 mL 0   valsartan (DIOVAN) 40 MG tablet Take 1 tablet (40 mg total) by mouth daily. 90 tablet 0   No facility-administered medications prior to visit.    Allergies  Allergen Reactions   Amoxicillin Swelling    Other reaction(s): swollen/rash   Rosuvastatin Calcium Other (See Comments)    ROS Review of Systems  Constitutional:  Positive for fatigue. Negative for appetite change, chills and fever.  HENT:  Negative for postnasal drip and rhinorrhea.   Respiratory:  Positive for cough. Negative for shortness of breath and wheezing.   Cardiovascular:  Negative for chest pain, palpitations and leg swelling.  Gastrointestinal:  Negative for abdominal pain, constipation, nausea and vomiting.  Genitourinary:  Negative for difficulty urinating, dysuria, flank pain and frequency.  Musculoskeletal:  Negative for arthralgias, back pain, joint swelling and myalgias.  Skin:  Positive for  rash. Negative for color change, pallor and wound.  Neurological:  Negative for dizziness, facial asymmetry, weakness, numbness and headaches.  Psychiatric/Behavioral:  Negative for behavioral problems, confusion, self-injury and suicidal ideas.       Objective:    Physical Exam Vitals and nursing note reviewed.  Constitutional:      General: She is not in acute distress.    Appearance: Normal appearance. She is not ill-appearing, toxic-appearing or diaphoretic.  HENT:     Mouth/Throat:     Mouth: Mucous membranes are moist.     Pharynx: Oropharynx is clear. No oropharyngeal exudate or posterior oropharyngeal erythema.  Eyes:     General: No scleral icterus.       Right eye: No discharge.        Left eye: No  discharge.     Extraocular Movements: Extraocular movements intact.     Conjunctiva/sclera: Conjunctivae normal.  Cardiovascular:     Rate and Rhythm: Normal rate and regular rhythm.     Pulses: Normal pulses.     Heart sounds: Normal heart sounds. No murmur heard.    No friction rub. No gallop.  Pulmonary:     Effort: Pulmonary effort is normal. No respiratory distress.     Breath sounds: Normal breath sounds. No stridor. No wheezing, rhonchi or rales.  Chest:     Chest wall: No tenderness.  Abdominal:     General: There is no distension.     Palpations: Abdomen is soft.     Tenderness: There is no abdominal tenderness. There is no right CVA tenderness, left CVA tenderness or guarding.  Musculoskeletal:        General: No swelling, tenderness, deformity or signs of injury.     Right lower leg: No edema.     Left lower leg: No edema.  Skin:    General: Skin is warm and dry.     Capillary Refill: Capillary refill takes less than 2 seconds.     Coloration: Skin is not jaundiced or pale.     Findings: Rash present. No bruising, erythema or lesion.     Comments: Non erythematous rashes noted on right hand  Neurological:     Mental Status: She is alert and oriented to person, place, and time.     Motor: No weakness.     Coordination: Coordination normal.     Gait: Gait normal.  Psychiatric:        Mood and Affect: Mood normal.        Behavior: Behavior normal.        Thought Content: Thought content normal.        Judgment: Judgment normal.     BP (!) 138/55   Pulse 76   Temp (!) 97.5 F (36.4 C)   Wt 142 lb 12.8 oz (64.8 kg)   SpO2 100%   BMI 24.51 kg/m  Wt Readings from Last 3 Encounters:  01/09/24 142 lb 12.8 oz (64.8 kg)  12/12/23 143 lb 6.4 oz (65 kg)  01/10/19 135 lb (61.2 kg)    Lab Results  Component Value Date   TSH 3.510 05/02/2017   Lab Results  Component Value Date   WBC 6.5 01/01/2019   HGB 12.0 01/01/2019   HCT 38.3 01/01/2019   MCV 89.3  01/01/2019   PLT 234 01/01/2019   Lab Results  Component Value Date   NA 139 12/18/2023   K 4.3 12/18/2023   CO2 22 12/18/2023   GLUCOSE 145 (H) 12/18/2023  BUN 16 12/18/2023   CREATININE 0.71 12/18/2023   BILITOT 0.3 12/18/2023   ALKPHOS 92 12/18/2023   AST 25 12/18/2023   ALT 31 12/18/2023   PROT 7.4 12/18/2023   ALBUMIN 4.1 12/18/2023   CALCIUM 9.4 12/18/2023   ANIONGAP 5 01/01/2019   EGFR 100 12/18/2023   Lab Results  Component Value Date   CHOL 212 (H) 12/18/2023   Lab Results  Component Value Date   HDL 43 12/18/2023   Lab Results  Component Value Date   LDLCALC 153 (H) 12/18/2023   Lab Results  Component Value Date   TRIG 87 12/18/2023   Lab Results  Component Value Date   CHOLHDL 4.9 (H) 12/18/2023   Lab Results  Component Value Date   HGBA1C 8.6 (A) 12/12/2023      Assessment & Plan:   Problem List Items Addressed This Visit       Cardiovascular and Mediastinum   High blood pressure   BP Readings from Last 3 Encounters:  01/09/24 (!) 138/55  12/12/23 (!) 143/59  10/11/23 138/84  Has a prescription for valsartan 40 mg daily but not taking the medication consistently due to her religious fasting Discussed DASH diet and dietary sodium restrictions Continue to increase dietary efforts and exercise.  Needs a BP cuff she was referred to the clinical pharmacist but they have not been able to reach her, the office phone number was provided today.          Respiratory   Allergic rhinitis   Continue Flonase nasal spray 2 spray into both nostrils daily Claritin 10 mg daily        Endocrine   Uncontrolled type 2 diabetes mellitus with hyperglycemia (HCC)   Lab Results  Component Value Date   HGBA1C 8.6 (A) 12/12/2023  Continue Jardiance 25 mg daily, metformin 1000 mg daily, Actos 45 mg daily She plans taking metformin 1000 mg twice daily once her religious fasting is over CBG goals discussed Follow-up in 2 months          Musculoskeletal and Integument   Eczema   Has a steroid cream that she uses at home as needed, she does not know the name of the steroid cream.  Encouraged the patient to continue using the steroid cream as needed avoid scratching the sites to prevent infection.  Encouraged to keep skin well moisturized        Other   Fatigue - Primary   Checking CBC, B12, vitamin D       Relevant Orders   CBC   VITAMIN D 25 Hydroxy (Vit-D Deficiency, Fractures)   Vitamin B12   POC COVID-19 BinaxNow (Completed)   POCT Influenza A/B (Completed)   Dyslipidemia, goal LDL below 70   Lab Results  Component Value Date   CHOL 212 (H) 12/18/2023   HDL 43 12/18/2023   LDLCALC 153 (H) 12/18/2023   TRIG 87 12/18/2023   CHOLHDL 4.9 (H) 12/18/2023  Continue simvastatin 80 mg daily, will recheck labs at next visit LDL goal is less than 70      Acute cough   Test negative for COVID and flu Tessalon Perles 100 mg 3 times daily as needed ordered Encouraged to drink at least 64 ounces of water daily to maintain hydration      Relevant Medications   benzonatate (TESSALON PERLES) 100 MG capsule   Other Relevant Orders   POC COVID-19 BinaxNow (Completed)   POCT Influenza A/B (Completed)   Other Visit Diagnoses  Screening mammogram for breast cancer       Relevant Orders   MM Digital Screening     Screening for colon cancer       Relevant Orders   Ambulatory referral to Gastroenterology       Meds ordered this encounter  Medications   benzonatate (TESSALON PERLES) 100 MG capsule    Sig: Take 1 capsule (100 mg total) by mouth 3 (three) times daily as needed for cough.    Dispense:  30 capsule    Refill:  0    Follow-up: Return in about 2 months (around 03/10/2024) for HTN, HYPERLIPIDEMIA, DM.    Donell Beers, FNP

## 2024-01-09 NOTE — Assessment & Plan Note (Signed)
 Has a steroid cream that she uses at home as needed, she does not know the name of the steroid cream.  Encouraged the patient to continue using the steroid cream as needed avoid scratching the sites to prevent infection.  Encouraged to keep skin well moisturized

## 2024-01-09 NOTE — Assessment & Plan Note (Addendum)
 Lab Results  Component Value Date   HGBA1C 8.6 (A) 12/12/2023  Continue Jardiance 25 mg daily, metformin 1000 mg daily, Actos 45 mg daily She plans taking metformin 1000 mg twice daily once her religious fasting is over CBG goals discussed Follow-up in 2 months

## 2024-01-09 NOTE — Assessment & Plan Note (Signed)
 Continue Flonase nasal spray 2 spray into both nostrils daily Claritin 10 mg daily

## 2024-01-09 NOTE — Assessment & Plan Note (Signed)
 Test negative for COVID and flu Tessalon Perles 100 mg 3 times daily as needed ordered Encouraged to drink at least 64 ounces of water daily to maintain hydration

## 2024-01-09 NOTE — Assessment & Plan Note (Signed)
 Checking CBC, B12, vitamin D

## 2024-01-10 LAB — VITAMIN D 25 HYDROXY (VIT D DEFICIENCY, FRACTURES): Vit D, 25-Hydroxy: 21.8 ng/mL — ABNORMAL LOW (ref 30.0–100.0)

## 2024-01-10 LAB — CBC
Hematocrit: 36.1 % (ref 34.0–46.6)
Hemoglobin: 11.8 g/dL (ref 11.1–15.9)
MCH: 28.6 pg (ref 26.6–33.0)
MCHC: 32.7 g/dL (ref 31.5–35.7)
MCV: 88 fL (ref 79–97)
Platelets: 271 10*3/uL (ref 150–450)
RBC: 4.12 x10E6/uL (ref 3.77–5.28)
RDW: 13.1 % (ref 11.7–15.4)
WBC: 5.8 10*3/uL (ref 3.4–10.8)

## 2024-01-10 LAB — VITAMIN B12: Vitamin B-12: 581 pg/mL (ref 232–1245)

## 2024-02-10 DIAGNOSIS — Z419 Encounter for procedure for purposes other than remedying health state, unspecified: Secondary | ICD-10-CM | POA: Diagnosis not present

## 2024-02-29 ENCOUNTER — Encounter: Payer: Self-pay | Admitting: Nurse Practitioner

## 2024-03-11 DIAGNOSIS — Z419 Encounter for procedure for purposes other than remedying health state, unspecified: Secondary | ICD-10-CM | POA: Diagnosis not present

## 2024-03-12 ENCOUNTER — Encounter: Payer: Self-pay | Admitting: Nurse Practitioner

## 2024-03-12 ENCOUNTER — Ambulatory Visit (INDEPENDENT_AMBULATORY_CARE_PROVIDER_SITE_OTHER): Payer: Self-pay | Admitting: Nurse Practitioner

## 2024-03-12 VITALS — BP 135/43 | HR 74 | Wt 141.0 lb

## 2024-03-12 DIAGNOSIS — E785 Hyperlipidemia, unspecified: Secondary | ICD-10-CM

## 2024-03-12 DIAGNOSIS — E1165 Type 2 diabetes mellitus with hyperglycemia: Secondary | ICD-10-CM

## 2024-03-12 DIAGNOSIS — I10 Essential (primary) hypertension: Secondary | ICD-10-CM

## 2024-03-12 LAB — POCT GLYCOSYLATED HEMOGLOBIN (HGB A1C): Hemoglobin A1C: 8.7 % — AB (ref 4.0–5.6)

## 2024-03-12 MED ORDER — GLIPIZIDE 5 MG PO TABS: 5.0000 mg | ORAL_TABLET | Freq: Every day | ORAL | 1 refills | Status: DC

## 2024-03-12 MED ORDER — METFORMIN HCL 1000 MG PO TABS: 1000.0000 mg | ORAL_TABLET | Freq: Two times a day (BID) | ORAL | 3 refills | Status: DC

## 2024-03-12 MED ORDER — EMPAGLIFLOZIN 25 MG PO TABS: 25.0000 mg | ORAL_TABLET | Freq: Every day | ORAL | 1 refills | Status: DC

## 2024-03-12 MED ORDER — PIOGLITAZONE HCL 45 MG PO TABS: 45.0000 mg | ORAL_TABLET | Freq: Every day | ORAL | 1 refills | Status: DC

## 2024-03-12 NOTE — Assessment & Plan Note (Signed)
 BP Readings from Last 3 Encounters:  03/12/24 (!) 135/43  01/09/24 (!) 138/55  12/12/23 (!) 143/59  Systolic blood pressure is elevated, diastolic blood pressure is low Not taking valsartan  Discussed DASH diet and dietary sodium restrictions Continue to increase dietary efforts and exercise.

## 2024-03-12 NOTE — Patient Instructions (Addendum)
 Goal for fasting blood sugar ranges from 80 to 120 and 2 hours after any meal or at bedtime should be between 130 to 170.   Please consider getting Shingrix , pneumococcal and Tdap vaccine at local pharmacy.    1. Uncontrolled type 2 diabetes mellitus with hyperglycemia (HCC) (Primary)  - POCT glycosylated hemoglobin (Hb A1C) - Direct LDL - Ambulatory referral to diabetic education - glipiZIDE  (GLUCOTROL ) 5 MG tablet; Take 1 tablet (5 mg total) by mouth daily.  Dispense: 90 tablet; Refill: 1    Medicaid Dentists  You can search for additional providers at  [email or web address].   When you call a provider for an appointment, make sure they still accept your Medicaid and are accepting adult patients.   Perri Brake [address] [place], [place] [address] [phone] Taking new Medicaid patients as of 11/09/20  [facility] - Moira Andrews [address], [place], [place] [address] And [address] [place], [place] [address] [phone] Taking new Medicaid patients as of 11/09/20  Vernel Golds [address] [place], [place] [address] [phone] Only takes Medicaid patients for partials or dentures, no other services       It is important that you exercise regularly at least 30 minutes 5 times a week as tolerated  Think about what you will eat, plan ahead. Choose " clean, green, fresh or frozen" over canned, processed or packaged foods which are more sugary, salty and fatty. 70 to 75% of food eaten should be vegetables and fruit. Three meals at set times with snacks allowed between meals, but they must be fruit or vegetables. Aim to eat over a 12 hour period , example 7 am to 7 pm, and STOP after  your last meal of the day. Drink water,generally about 64 ounces per day, no other drink is as healthy. Fruit juice is best enjoyed in a healthy way, by EATING the fruit.  Thanks for choosing [facility] we consider it a privelige to serve you.

## 2024-03-12 NOTE — Assessment & Plan Note (Signed)
 No fasting so checking direct LDL Avoid fatty fried foods LDL goal is less than 70 Lab Results  Component Value Date   CHOL 212 (H) 12/18/2023   HDL 43 12/18/2023   LDLCALC 153 (H) 12/18/2023   TRIG 87 12/18/2023   CHOLHDL 4.9 (H) 12/18/2023

## 2024-03-12 NOTE — Assessment & Plan Note (Addendum)
 Lab Results  Component Value Date   HGBA1C 8.7 (A) 03/12/2024  Chronic uncontrolled condition Continue Jardiance  25 mg daily, metformin  1000 mg twice daily, Actos  45 mg daily We discussed starting Trulicity but she has family history of thyroid cancer(does not know what type of thyroid cancer it is) Start glipizide  5 mg daily, CBG goals discussed Patient referred for diabetes nutrition education Has upcoming appointment with the clinical pharmacist, date and time of the appointment provided patient encouraged to keep the appointment. Has not had a diabetic eye exam in the past 12 months need for the exam discussed, encouraged to get the exam completed.  Diabetic foot exam completed today.  Need for dental examination also discussed Follow-up in 3 months

## 2024-03-12 NOTE — Progress Notes (Signed)
 Established Patient Office Visit  Subjective:  Patient ID: [name], female    DOB: Jul 28, 1966  Age: 58 y.o. MRN: [id number]  CC:  Chief Complaint  Patient presents with   Hypertension   Diabetes   Hyperlipidemia    Not fasting    HPI [name] is a 58 y.o. 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), Arthralgia (05/05/2014), Cervical strain (04/07/2015), Chronic headaches (01/06/2014), Dandruff (01/06/2014), Depressed, Diabetes mellitus (HCC) (01/17/2013), Diabetes mellitus without complication (HCC), Dysuria (12/19/2012), Eczema (04/04/2017), Fatigue (04/04/2017), Fatty liver, History DEPRESSION (03/10/2007), History of barium enema, History of pelvic ultrasound (09/2005), Hyperlipidemia, Hypertension, Lactose intolerance (08/13/2015), Left axillary pain (02/12/2013), Left genital labial abscess (06/05/2014), Lower abdominal pain (12/07/2012), MASTALGIA (03/10/2007), Pain, dental (12/08/2016), Right upper quadrant abdominal pain (01/17/2013), Shoulder pain, left (09/04/2012), Sinus congestion (03/12/2013), Sinusitis, chronic (09/30/2013), Somatic complaints, multiple, SYMPTOM, PAIN, ABDOMINAL, OTH SPECIFIED SITE (01/10/2005), and 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  Patient presents for follow-up for her chronic medical conditions  Hypertension.  Currently on valsartan  40 mg daily.  States that she does not take the medication because it makes her feel dizzy whenever she takes it, no complains of shortness of breath chest pain edema  Uncontrolled type 2 diabetes.  Currently on metformin  1000 mg twice daily, Actos  45 mg daily, Jardiance  25 mg daily, takes simvastatin  80 mg daily for hyperlipidemia.    Fasting blood sugar readings has been between 120s to 140s at home, states that she has been trying to follow a low-carb diet. Patient denies polyuria polyphagia polydipsia.  Encouraged to get her mammogram completed, stated that she called to make an appointment but she was told that they would call her back.  Need for Tdap vaccine, shingles vaccine, pneumococcal vaccine discussed encouraged to get vaccines at pharmacy She is accompanied by her daughter    Past Medical History:  Diagnosis Date   Abdominal pain, epigastric 06/11/2015   Abdominal ultrasound, abnormal    normal   Abnormal abdominal CT scan 01/20/2005   and pelvic CT, normal    Allergic dermatitis 09/17/2015   ALLERGIC RHINITIS WITH CONJUNCTIVITIS 06/30/2009   Qualifier: Diagnosis of  By: Jayne Mews MD, Elizabeth     Amenorrhea 03/29/2013   Anxiety with somatization 03/12/2013   Arthralgia 05/05/2014   Cervical strain 04/07/2015   Chronic headaches 01/06/2014   Dandruff 01/06/2014   Depressed    Diabetes mellitus (HCC) 01/17/2013   Diabetes mellitus without complication (HCC)    Dysuria 12/19/2012   Eczema 04/04/2017   Fatigue 04/04/2017   Fatty liver    History DEPRESSION 03/10/2007   Qualifier: Diagnosis of  By: [employer] Keys tech, Bridget[facility]     History of barium enema    normal   History of pelvic ultrasound 09/2005   normal    Hyperlipidemia    Hypertension    Lactose intolerance 08/13/2015   Left axillary pain 02/12/2013   Left genital labial abscess 06/05/2014   Lower abdominal pain 12/07/2012   MASTALGIA 03/10/2007   Qualifier: Diagnosis of  By: [employer] Keys tech, Jennifer     Pain, dental 12/08/2016   Right upper quadrant abdominal pain 01/17/2013   Shoulder pain, left 09/04/2012   Sinus congestion 03/12/2013   Sinusitis, chronic 09/30/2013   Somatic complaints, multiple    SYMPTOM, PAIN, ABDOMINAL, OTH SPECIFIED SITE 01/10/2005  Qualifier: Diagnosis of  By: [employer] Keys tech, Jeannetta Millman (WOLFE)-PARKINSON-WHITE (WPW) SYNDROME 11/04/2010   Follows with Dr. Carolynne Citron at [facility]. Last visit was 2012. Instructed that if pt develops palpitations, then catheter ablation would be recommended.       Past Surgical History:  Procedure Laterality Date   APPENDECTOMY     caesarean section     x2   CHOLECYSTECTOMY  2009    Family History  Problem Relation Age of Onset   Throat cancer Father        non-smoker   Diabetes Mother    Hypertension Mother     Social History   Socioeconomic History   Marital status: Married    Spouse name: Not on file   Number of children: 3   Years of education: Not on file   Highest education level: Not on file  Occupational History   Not on file  Tobacco Use   Smoking status: Never   Smokeless tobacco: Never  Substance and Sexual Activity   Alcohol use: No    Alcohol/week: 0.0 standard drinks of alcohol   Drug use: No   Sexual activity: Not Currently  Other Topics Concern   Not on file  Social History Narrative   Originally from [place]. 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 Lives at home with husband and 3 children.    Social Drivers of Corporate investment banker Strain: Not on file  Food Insecurity: Not on file  Transportation Needs: Not on file  Physical Activity: Not on file  Stress: Not on file  Social Connections: Not on file  Intimate Partner Violence: Not on file    Outpatient Medications Prior to Visit  Medication Sig Dispense Refill   fluticasone  (FLONASE ) 50 MCG/ACT nasal spray Place 2 sprays into both nostrils daily. 11.1 mL 3   ibuprofen  (ADVIL ) 800 MG tablet Take 1 tablet (800 mg total) by mouth every 8 (eight) hours as needed. 30 tablet 0   simvastatin  (ZOCOR ) 80 MG tablet Take 1 tablet (80 mg total) by mouth daily. 90 tablet 1   empagliflozin  (JARDIANCE ) 25 MG TABS tablet Take 1 tablet (25 mg total) by mouth daily. 90 tablet 1   metFORMIN  (GLUCOPHAGE ) 1000 MG tablet  Take 1 tablet (1,000 mg total) by mouth 2 (two) times daily with a meal. 180 tablet 3   pioglitazone  (ACTOS ) 45 MG tablet Take 1 tablet (45 mg total) by mouth daily. 90 tablet 1   benzonatate  (TESSALON  PERLES) 100 MG capsule Take 1 capsule (100 mg total) by mouth 3 (three) times daily as needed for cough. (Patient not taking: Reported on 03/12/2024) 30 capsule 0   loratadine  (CLARITIN ) 10 MG tablet Take 1 tablet (10 mg total) by mouth daily. (Patient not taking: Reported on 03/12/2024) 30 tablet 11   sodium chloride (OCEAN) 0.65 % SOLN nasal spray Place 1 spray into both nostrils as needed. (Patient not taking: Reported on 03/12/2024) 30 mL 0   valsartan  (DIOVAN ) 40 MG tablet Take 1 tablet (40 mg total) by mouth daily. (Patient not taking: Reported on 03/12/2024) 90 tablet 0   No facility-administered medications prior to visit.    Allergies  Allergen Reactions   Amoxicillin  Swelling    Other reaction(s): swollen/rash   Rosuvastatin Calcium Other (See Comments)    ROS Review of Systems  Constitutional:  Negative for appetite change, chills, fatigue and fever.  HENT:  Negative for congestion,  postnasal drip, rhinorrhea and sneezing.   Respiratory:  Negative for cough, shortness of breath and wheezing.   Cardiovascular:  Negative for chest pain, palpitations and leg swelling.  Gastrointestinal:  Negative for abdominal pain, constipation, nausea and vomiting.  Genitourinary:  Negative for difficulty urinating, dysuria, flank pain and frequency.  Musculoskeletal:  Negative for gait problem, joint swelling and myalgias.  Skin:  Negative for color change, pallor, rash and wound.  Neurological:  Negative for dizziness, facial asymmetry, weakness, numbness and headaches.  Psychiatric/Behavioral:  Negative for behavioral problems, confusion, self-injury and suicidal ideas.       Objective:     Physical Exam Vitals and nursing note reviewed.  Constitutional:      General: She is not in acute  distress.    Appearance: Normal appearance. She is not ill-appearing, toxic-appearing or diaphoretic.  Eyes:     General: No scleral icterus.       Right eye: No discharge.        Left eye: No discharge.     Extraocular Movements: Extraocular movements intact.     Conjunctiva/sclera: Conjunctivae normal.  Cardiovascular:     Rate and Rhythm: Normal rate and regular rhythm.     Pulses: Normal pulses.     Heart sounds: Normal heart sounds. No murmur heard.    No friction rub. No gallop.  Pulmonary:     Effort: Pulmonary effort is normal. No respiratory distress.     Breath sounds: Normal breath sounds. No stridor. No wheezing, rhonchi or rales.  Chest:     Chest wall: No tenderness.  Abdominal:     General: There is no distension.     Palpations: Abdomen is soft.     Tenderness: There is no abdominal tenderness. There is no right CVA tenderness, left CVA tenderness or guarding.  Musculoskeletal:        General: No swelling or signs of injury.     Right lower leg: No edema.     Left lower leg: No edema.  Skin:    General: Skin is warm and dry.     Capillary Refill: Capillary refill takes less than 2 seconds.     Coloration: Skin is not jaundiced or pale.     Findings: No bruising, erythema or lesion.  Neurological:     Mental Status: She is alert and oriented to person, place, and time.     Motor: No weakness.     Coordination: Coordination normal.     Gait: Gait normal.  Psychiatric:        Mood and Affect: Mood normal.        Behavior: Behavior normal.        Thought Content: Thought content normal.        Judgment: Judgment normal.     BP (!) 135/43   Pulse 74   Wt 141 lb (64 kg)   SpO2 100%   BMI 24.20 kg/m  Wt Readings from Last 3 Encounters:  03/12/24 141 lb (64 kg)  01/09/24 142 lb 12.8 oz (64.8 kg)  12/12/23 143 lb 6.4 oz (65 kg)    Lab Results  Component Value Date   TSH 3.510 05/02/2017   Lab Results  Component Value Date   WBC 5.8 01/09/2024    HGB 11.8 01/09/2024   HCT 36.1 01/09/2024   MCV 88 01/09/2024   PLT 271 01/09/2024   Lab Results  Component Value Date   NA 139 12/18/2023   K 4.3 12/18/2023  CO2 22 12/18/2023   GLUCOSE 145 (H) 12/18/2023   BUN 16 12/18/2023   CREATININE 0.71 12/18/2023   BILITOT 0.3 12/18/2023   ALKPHOS 92 12/18/2023   AST 25 12/18/2023   ALT 31 12/18/2023   PROT 7.4 12/18/2023   ALBUMIN 4.1 12/18/2023   CALCIUM 9.4 12/18/2023   ANIONGAP 5 01/01/2019   EGFR 100 12/18/2023   Lab Results  Component Value Date   CHOL 212 (H) 12/18/2023   Lab Results  Component Value Date   HDL 43 12/18/2023   Lab Results  Component Value Date   LDLCALC 153 (H) 12/18/2023   Lab Results  Component Value Date   TRIG 87 12/18/2023   Lab Results  Component Value Date   CHOLHDL 4.9 (H) 12/18/2023   Lab Results  Component Value Date   HGBA1C 8.7 (A) 03/12/2024      Assessment & Plan:   Problem List Items Addressed This Visit       Cardiovascular and Mediastinum   High blood pressure   BP Readings from Last 3 Encounters:  03/12/24 (!) 135/43  01/09/24 (!) 138/55  12/12/23 (!) 143/59  Systolic blood pressure is elevated, diastolic blood pressure is low Not taking valsartan  Discussed DASH diet and dietary sodium restrictions Continue to increase dietary efforts and exercise.           Endocrine   Uncontrolled type 2 diabetes mellitus with hyperglycemia (HCC) - Primary   Lab Results  Component Value Date   HGBA1C 8.7 (A) 03/12/2024  Chronic uncontrolled condition Continue Jardiance  25 mg daily, metformin  1000 mg twice daily, Actos  45 mg daily We discussed starting Trulicity but she has family history of thyroid cancer(does not know what type of thyroid cancer it is) Start glipizide  5 mg daily, CBG goals discussed Patient referred for diabetes nutrition education Has upcoming appointment with the clinical pharmacist, date and time of the appointment provided patient encouraged to  keep the appointment. Has not had a diabetic eye exam in the past 12 months need for the exam discussed, encouraged to get the exam completed.  Diabetic foot exam completed today.  Need for dental examination also discussed Follow-up in 3 months      Relevant Medications   glipiZIDE  (GLUCOTROL ) 5 MG tablet   metFORMIN  (GLUCOPHAGE ) 1000 MG tablet   empagliflozin  (JARDIANCE ) 25 MG TABS tablet   pioglitazone  (ACTOS ) 45 MG tablet   Other Relevant Orders   POCT glycosylated hemoglobin (Hb A1C) (Completed)   Direct LDL   Ambulatory referral to diabetic education     Other   Dyslipidemia, goal LDL below 70   No fasting so checking direct LDL Avoid fatty fried foods LDL goal is less than 70 Lab Results  Component Value Date   CHOL 212 (H) 12/18/2023   HDL 43 12/18/2023   LDLCALC 153 (H) 12/18/2023   TRIG 87 12/18/2023   CHOLHDL 4.9 (H) 12/18/2023          Meds ordered this encounter  Medications   glipiZIDE  (GLUCOTROL ) 5 MG tablet    Sig: Take 1 tablet (5 mg total) by mouth daily.    Dispense:  90 tablet    Refill:  1   metFORMIN  (GLUCOPHAGE ) 1000 MG tablet    Sig: Take 1 tablet (1,000 mg total) by mouth 2 (two) times daily with a meal.    Dispense:  180 tablet    Refill:  3   empagliflozin  (JARDIANCE ) 25 MG TABS tablet    Sig: Take 1  tablet (25 mg total) by mouth daily.    Dispense:  90 tablet    Refill:  1   pioglitazone  (ACTOS ) 45 MG tablet    Sig: Take 1 tablet (45 mg total) by mouth daily.    Dispense:  90 tablet    Refill:  1    Follow-up: Return in about 3 months (around 06/12/2024) for HTN, DM, PAP smear.    Mayra Brahm R Rogena Deupree, FNP

## 2024-03-13 ENCOUNTER — Ambulatory Visit: Payer: Self-pay | Admitting: Nurse Practitioner

## 2024-03-13 ENCOUNTER — Other Ambulatory Visit: Payer: Self-pay | Admitting: Nurse Practitioner

## 2024-03-13 DIAGNOSIS — E785 Hyperlipidemia, unspecified: Secondary | ICD-10-CM

## 2024-03-13 LAB — LDL CHOLESTEROL, DIRECT: LDL Direct: 160 mg/dL — ABNORMAL HIGH (ref 0–99)

## 2024-03-13 MED ORDER — EZETIMIBE 10 MG PO TABS: 10.0000 mg | ORAL_TABLET | Freq: Every day | ORAL | 1 refills | Status: DC

## 2024-03-13 MED ORDER — SIMVASTATIN 80 MG PO TABS: 80.0000 mg | ORAL_TABLET | Freq: Every day | ORAL | 1 refills | Status: DC

## 2024-03-26 ENCOUNTER — Telehealth: Payer: Self-pay

## 2024-03-26 ENCOUNTER — Other Ambulatory Visit: Payer: Self-pay

## 2024-03-26 NOTE — Progress Notes (Signed)
   03/26/2024  Patient ID: Crispin Dolphin, female   DOB: Sep 11, 1966, 58 y.o.   MRN: [id number]  Attempted to contact patient for medication management/review. Left HIPAA compliant message for patient to return my call at their convenience.    A third unsuccesful telephone outreach was attempted today to contact the patient who was referred to the pharmacy for assistance with medication management. The Population Health team is pleased to engage with this patient at any time in the future upon receipt of referral and should he/she be interested in assistance from the [facility] team.   Per Dr. Anson Basta, patient is non-adherent to medications and this was also noted in last office visit note with PCP regarding non-compliance with valsartan  specifically (due to side effect). Future consideration for discussion for pillbox and auto refill (if she doesn't uses this service at her pharmacy or assessment for adherence packaging).   Thank you for allowing pharmacy to be a part of this patient's care.  Alexandria Angel, PharmD Clinical Pharmacist Cell: [phone]

## 2024-04-11 DIAGNOSIS — Z419 Encounter for procedure for purposes other than remedying health state, unspecified: Secondary | ICD-10-CM | POA: Diagnosis not present

## 2024-04-29 ENCOUNTER — Ambulatory Visit: Admitting: Dietician

## 2024-05-11 DIAGNOSIS — Z419 Encounter for procedure for purposes other than remedying health state, unspecified: Secondary | ICD-10-CM | POA: Diagnosis not present

## 2024-06-11 ENCOUNTER — Encounter: Payer: Self-pay | Admitting: Dietician

## 2024-06-11 ENCOUNTER — Encounter: Attending: Nurse Practitioner | Admitting: Dietician

## 2024-06-11 DIAGNOSIS — E119 Type 2 diabetes mellitus without complications: Secondary | ICD-10-CM | POA: Diagnosis not present

## 2024-06-11 DIAGNOSIS — Z419 Encounter for procedure for purposes other than remedying health state, unspecified: Secondary | ICD-10-CM | POA: Diagnosis not present

## 2024-06-11 NOTE — Patient Instructions (Signed)
 Goals Established by Patient:  Goal 1: aim for 4 bottles of water daily.   Goal 2: go walking for 10 minutes daily.

## 2024-06-11 NOTE — Progress Notes (Signed)
 Diabetes Self-Management Education  Visit Type: First/Initial  Appt. Start Time: 1430 Appt. End Time: 1530  06/11/2024  [name], identified by name and date of birth, is a 58 y.o. female with a diagnosis of Diabetes: Type 2.   ASSESSMENT  History includes: type 2 diabetes, anxiety, depression, HLD, HTN,  Labs noted: 03/12/24: 8.7% Medications include: jardiance , glipizide , metformin , actos  Supplements: none reported  Pt present today with her son [name].   Pt reports she wants to work to control her diabetes. Pt states she wants to make changes to diet and lifestyle to bring A1c down.   Pt reports she checks her blood glucose 2-3 times daily. Pt states yesterday it was 93mg /dL when she woke up. Pt reports after eating it was 169mg /dL. Pt reports she has noticed that it goes higher after a high carb meal.   There were no vitals taken for this visit. There is no height or weight on file to calculate BMI.   Diabetes Self-Management Education - 06/11/24 1436       Visit Information   Visit Type First/Initial      Initial Visit   Diabetes Type Type 2    Date Diagnosed 2013    Are you currently following a meal plan? No    Are you taking your medications as prescribed? Yes      Health Coping   How would you rate your overall health? Good      Psychosocial Assessment   Patient Belief/Attitude about Diabetes Motivated to manage diabetes    What is the hardest part about your diabetes right now, causing you the most concern, or is the most worrisome to you about your diabetes?   Making healty food and beverage choices;Getting support / problem solving    Self-care barriers None    Self-management support Doctor's office    Other persons present Patient    Patient Concerns Nutrition/Meal planning    Special Needs None    Preferred Learning Style No preference indicated    Learning Readiness Ready    How often do you need to have someone help you when you read  instructions, pamphlets, or other written materials from your doctor or pharmacy? 1 - Never      Pre-Education Assessment   Patient understands the diabetes disease and treatment process. Needs Instruction    Patient understands incorporating nutritional management into lifestyle. Needs Instruction    Patient undertands incorporating physical activity into lifestyle. Needs Instruction    Patient understands using medications safely. Needs Instruction    Patient understands monitoring blood glucose, interpreting and using results Needs Instruction    Patient understands prevention, detection, and treatment of acute complications. Needs Instruction    Patient understands prevention, detection, and treatment of chronic complications. Needs Instruction    Patient understands how to develop strategies to address psychosocial issues. Needs Instruction    Patient understands how to develop strategies to promote health/change behavior. Needs Instruction      Complications   Last HgB A1C per patient/outside source 8.7 %    How often do you check your blood sugar? 1-2 times/day    Fasting Blood glucose range (mg/dL) 29-870;869-820    Postprandial Blood glucose range (mg/dL) 869-820;819-799    Have you had a dilated eye exam in the past 12 months? Yes    Have you had a dental exam in the past 12 months? No    Are you checking your feet? Yes    How many days  per week are you checking your feet? 5      Dietary Intake   Breakfast 9am: tea, 2 egg, 3 strawberries    Snack (morning) almonds    Lunch none    Snack (afternoon) fig OR banana    Dinner 5pm: bread, okra with corn meal, watemelon    Snack (evening) baklava and tea    Beverage(s) 2-3 c tea, 2-3 bottles water      Activity / Exercise   Activity / Exercise Type ADL's      Patient Education   Previous Diabetes Education No    Disease Pathophysiology Definition of diabetes, type 1 and 2, and the diagnosis of diabetes;Explored patient's  options for treatment of their diabetes;Factors that contribute to the development of diabetes    Healthy Eating Role of diet in the treatment of diabetes and the relationship between the three main macronutrients and blood glucose level;Plate Method;Reviewed blood glucose goals for pre and post meals and how to evaluate the patients' food intake on their blood glucose level.;Information on hints to eating out and maintain blood glucose control.;Meal options for control of blood glucose level and chronic complications.    Being Active Role of exercise on diabetes management, blood pressure control and cardiac health.;Helped patient identify appropriate exercises in relation to his/her diabetes, diabetes complications and other health issue.    Medications Reviewed patients medication for diabetes, action, purpose, timing of dose and side effects.    Monitoring Purpose and frequency of SMBG.;Identified appropriate SMBG and/or A1C goals.    Acute complications Taught prevention, symptoms, and  treatment of hypoglycemia - the 15 rule.    Chronic complications Relationship between chronic complications and blood glucose control;Identified and discussed with patient  current chronic complications    Diabetes Stress and Support Identified and addressed patients feelings and concerns about diabetes;Worked with patient to identify barriers to care and solutions;Role of stress on diabetes    Lifestyle and Health Coping Lifestyle issues that need to be addressed for better diabetes care      Individualized Goals (developed by patient)   Nutrition General guidelines for healthy choices and portions discussed    Physical Activity Exercise 5-7 days per week;15 minutes per day    Medications take my medication as prescribed    Monitoring  Test my blood glucose as discussed    Problem Solving Eating Pattern    Reducing Risk examine blood glucose patterns;do foot checks daily;treat hypoglycemia with 15 grams of  carbs if blood glucose less than 70mg /dL    Health Coping Ask for help with psychological, social, or emotional issues      Post-Education Assessment   Patient understands the diabetes disease and treatment process. Comprehends key points    Patient understands incorporating nutritional management into lifestyle. Comprehends key points    Patient undertands incorporating physical activity into lifestyle. Comprehends key points    Patient understands using medications safely. Comphrehends key points    Patient understands monitoring blood glucose, interpreting and using results Comprehends key points    Patient understands prevention, detection, and treatment of acute complications. Comprehends key points    Patient understands prevention, detection, and treatment of chronic complications. Comprehends key points    Patient understands how to develop strategies to address psychosocial issues. Comprehends key points    Patient understands how to develop strategies to promote health/change behavior. Comprehends key points      Outcomes   Expected Outcomes Demonstrated interest in learning. Expect positive outcomes  Future DMSE 4-6 wks    Program Status Not Completed          Individualized Plan for Diabetes Self-Management Training:   Learning Objective:  Patient will have a greater understanding of diabetes self-management. Patient education plan is to attend individual and/or group sessions per assessed needs and concerns.   Plan:   Patient Instructions  Goals Established by Patient:  Goal 1: aim for 4 bottles of water daily.   Goal 2: go walking for 10 minutes daily.   Expected Outcomes:  Demonstrated interest in learning. Expect positive outcomes  Education material provided: ADA - How to Thrive: A Guide for Your Journey with Diabetes and My Plate  If problems or questions, patient to contact team via:  Phone  Future DSME appointment: 4-6 wks

## 2024-06-12 ENCOUNTER — Ambulatory Visit: Payer: Self-pay | Admitting: Nurse Practitioner

## 2024-06-12 ENCOUNTER — Encounter: Payer: Self-pay | Admitting: Nurse Practitioner

## 2024-06-12 VITALS — BP 118/47 | HR 69 | Temp 98.2°F | Wt 139.2 lb

## 2024-06-12 DIAGNOSIS — M542 Cervicalgia: Secondary | ICD-10-CM | POA: Insufficient documentation

## 2024-06-12 DIAGNOSIS — N644 Mastodynia: Secondary | ICD-10-CM

## 2024-06-12 DIAGNOSIS — L309 Dermatitis, unspecified: Secondary | ICD-10-CM

## 2024-06-12 DIAGNOSIS — E785 Hyperlipidemia, unspecified: Secondary | ICD-10-CM | POA: Diagnosis not present

## 2024-06-12 DIAGNOSIS — H9202 Otalgia, left ear: Secondary | ICD-10-CM | POA: Diagnosis not present

## 2024-06-12 DIAGNOSIS — Z1231 Encounter for screening mammogram for malignant neoplasm of breast: Secondary | ICD-10-CM | POA: Diagnosis not present

## 2024-06-12 DIAGNOSIS — E1165 Type 2 diabetes mellitus with hyperglycemia: Secondary | ICD-10-CM | POA: Diagnosis not present

## 2024-06-12 LAB — POCT GLYCOSYLATED HEMOGLOBIN (HGB A1C): Hemoglobin A1C: 7.4 % — AB (ref 4.0–5.6)

## 2024-06-12 MED ORDER — DICLOFENAC SODIUM 1 % EX GEL
4.0000 g | Freq: Four times a day (QID) | CUTANEOUS | 1 refills | Status: DC
Start: 2024-06-12 — End: 2024-07-26

## 2024-06-12 MED ORDER — TRIAMCINOLONE ACETONIDE 0.5 % EX OINT: 1.0000 | TOPICAL_OINTMENT | Freq: Two times a day (BID) | CUTANEOUS | 0 refills | Status: DC

## 2024-06-12 MED ORDER — GLIPIZIDE 5 MG PO TABS: 5.0000 mg | ORAL_TABLET | Freq: Two times a day (BID) | ORAL | 1 refills | Status: DC

## 2024-06-12 NOTE — Patient Instructions (Signed)
 1. Uncontrolled type 2 diabetes mellitus with hyperglycemia (HCC) (Primary)  - POCT glycosylated hemoglobin (Hb A1C) - glipiZIDE  (GLUCOTROL ) 5 MG tablet; Take 1 tablet (5 mg total) by mouth 2 (two) times daily before a meal.  Dispense: 180 tablet; Refill: 1  2. Eczema, unspecified type  - triamcinolone  ointment (KENALOG ) 0.5 %; Apply 1 Application topically 2 (two) times daily.  Dispense: 30 g; Refill: 0  3. Neck pain  - diclofenac  Sodium (VOLTAREN  ARTHRITIS PAIN) 1 % GEL; Apply 4 g topically 4 (four) times daily.  Dispense: 50 g; Refill: 1     It is important that you exercise regularly at least 30 minutes 5 times a week as tolerated  Think about what you will eat, plan ahead. Choose  clean, green, fresh or frozen over canned, processed or packaged foods which are more sugary, salty and fatty. 70 to 75% of food eaten should be vegetables and fruit. Three meals at set times with snacks allowed between meals, but they must be fruit or vegetables. Aim to eat over a 12 hour period , example 7 am to 7 pm, and STOP after  your last meal of the day. Drink water,generally about 64 ounces per day, no other drink is as healthy. Fruit juice is best enjoyed in a healthy way, by EATING the fruit.  Thanks for choosing [facility] we consider it a privelige to serve you.

## 2024-06-12 NOTE — Assessment & Plan Note (Addendum)
 Chronic pain radiating to left chest and back.  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  Noted on CT scan done in 2023 Advised to take Tylenol  650 mg every 6 hours as needed Voltaren  gel prescribed, application of heat advised

## 2024-06-12 NOTE — Progress Notes (Signed)
 Established Patient Office Visit  Subjective:  Patient ID: [name], female    DOB: Jun 09, 1966  Age: 58 y.o. 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), Arthralgia (05/05/2014), Cervical strain (04/07/2015), Chronic headaches (01/06/2014), Dandruff (01/06/2014), Depressed, Diabetes mellitus (HCC) (01/17/2013), Diabetes mellitus without complication (HCC), Dysuria (12/19/2012), Eczema (04/04/2017), Fatigue (04/04/2017), Fatty liver, History DEPRESSION (03/10/2007), History of barium enema, History of pelvic ultrasound (09/2005), Hyperlipidemia, Hypertension, Lactose intolerance (08/13/2015), Left axillary pain (02/12/2013), Left genital labial abscess (06/05/2014), Lower abdominal pain (12/07/2012), MASTALGIA (03/10/2007), Pain, dental (12/08/2016), Right upper quadrant abdominal pain (01/17/2013), Shoulder pain, left (09/04/2012), Sinus congestion (03/12/2013), Sinusitis, chronic (09/30/2013), Somatic complaints, multiple, SYMPTOM, PAIN, ABDOMINAL, OTH SPECIFIED SITE (01/10/2005), and 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  with diabetes and hyperlipidemia who presents for follow-up of her diabetes management.  Her A1c has improved from 8.7% three months ago to 7.4% currently. She is taking Jardiance  25 mg once daily, , glipizide  5 mg once daily, metformin  1000 mg twice daily, , and Actos  45 mg daily. She has been inconsistent with taking Zetia , thinking it was for diabetes rather than cholesterol.  Taking  simvastatin  80 mg daily for hyperlipidemia no hypoglycemic episodes below 70, with the lowest being 92 recently. She follows a diabetic diet, avoiding sugar and sweet sodas, and consumes brown rice, brown bread, and limited fruits like strawberries.  She has been experiencing breast pain for the past month, located under the breast, with no discharge or discoloration. She has not had a mammogram due to insurance issues and is unsure about her Medicaid status.  She reports ear pain that started around the same time as the breast pain, with associated hearing difficulties and mucus production. She has a history of chronic ear issues and was previously referred to an ENT but has not followed up.  She has a history of eczema and has been using a triamcinolone  0.1% cream that she feels is ineffective. She experiences frequent itching and has been referred to dermatology in the past.  She experiences chronic pain in her back and chest. She had  used diclofenac  pills for pain management, which she finds helpful.  She has not had a Pap smear in the past five years and has not seen an eye doctor in the past year.      Past Medical History:  Diagnosis Date   Abdominal pain, epigastric 06/11/2015   Abdominal ultrasound, abnormal    normal   Abnormal abdominal CT scan 01/20/2005   and pelvic CT, normal    Allergic dermatitis 09/17/2015   ALLERGIC RHINITIS WITH CONJUNCTIVITIS 06/30/2009   Qualifier: Diagnosis of  By: Adella MD, Elizabeth     Amenorrhea 03/29/2013   Anxiety with somatization 03/12/2013   Arthralgia 05/05/2014   Cervical strain 04/07/2015   Chronic headaches 01/06/2014   Dandruff 01/06/2014   Depressed    Diabetes mellitus (HCC) 01/17/2013   Diabetes mellitus without complication (HCC)    Dysuria 12/19/2012   Eczema 04/04/2017   Fatigue 04/04/2017   Fatty liver    History DEPRESSION 03/10/2007  Qualifier: Diagnosis of  By: Rufus Gower tech, Delon     History of  barium enema    normal   History of pelvic ultrasound 09/2005   normal    Hyperlipidemia    Hypertension    Lactose intolerance 08/13/2015   Left axillary pain 02/12/2013   Left genital labial abscess 06/05/2014   Lower abdominal pain 12/07/2012   MASTALGIA 03/10/2007   Qualifier: Diagnosis of  By: Rufus Gower tech, Jennifer     Pain, dental 12/08/2016   Right upper quadrant abdominal pain 01/17/2013   Shoulder pain, left 09/04/2012   Sinus congestion 03/12/2013   Sinusitis, chronic 09/30/2013   Somatic complaints, multiple    SYMPTOM, PAIN, ABDOMINAL, OTH SPECIFIED SITE 01/10/2005   Qualifier: Diagnosis of  By: Rufus Gower tech, Delon BURKES (WOLFE)-PARKINSON-WHITE (WPW) SYNDROME 11/04/2010   Follows with Dr. Waddell at [facility]. Last visit was 2012. Instructed that if pt develops palpitations, then catheter ablation would be recommended.       Past Surgical History:  Procedure Laterality Date   APPENDECTOMY     caesarean section     x2   CHOLECYSTECTOMY  2009    Family History  Problem Relation Age of Onset   Throat cancer Father        non-smoker   Diabetes Mother    Hypertension Mother     Social History   Socioeconomic History   Marital status: Married    Spouse name: Not on file   Number of children: 3   Years of education: Not on file   Highest education level: Not on file  Occupational History   Not on file  Tobacco Use   Smoking status: Never   Smokeless tobacco: Never  Substance and Sexual Activity   Alcohol use: No    Alcohol/week: 0.0 standard drinks of alcohol   Drug use: No   Sexual activity: Not Currently  Other Topics Concern   Not on file  Social History Narrative   Originally from [place]. 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 Lives at home with husband and 3 children.    Social Drivers of Corporate investment banker Strain: Not on file  Food Insecurity: No Food Insecurity (06/11/2024)    Hunger Vital Sign    Worried About Running Out of Food in the Last Year: Never true    Ran Out of Food in the Last Year: Never true  Transportation Needs: No Transportation Needs (06/12/2024)   PRAPARE - Administrator, Civil Service (Medical): No    Lack of Transportation (Non-Medical): No  Physical Activity: Not on file  Stress: Not on file  Social Connections: Not on file  Intimate Partner Violence: Not on file    Outpatient Medications Prior to Visit  Medication Sig Dispense Refill   benzonatate  (TESSALON  PERLES) 100 MG capsule Take 1 capsule (100 mg total) by mouth 3 (three) times daily as needed for cough. 30 capsule 0   empagliflozin  (JARDIANCE ) 25 MG TABS tablet Take 1 tablet (25 mg total) by mouth daily. 90 tablet 1   ezetimibe  (ZETIA ) 10 MG tablet Take 1 tablet (10 mg total) by mouth daily. 90 tablet 1   fluticasone  (FLONASE ) 50 MCG/ACT nasal spray Place 2 sprays into both nostrils daily. 11.1 mL 3   ibuprofen  (ADVIL ) 800 MG tablet Take 1 tablet (800 mg total) by mouth every 8 (eight) hours as needed. 30 tablet 0  metFORMIN  (GLUCOPHAGE ) 1000 MG tablet Take 1 tablet (1,000 mg total) by mouth 2 (two) times daily with a meal. 180 tablet 3   pioglitazone  (ACTOS ) 45 MG tablet Take 1 tablet (45 mg total) by mouth daily. 90 tablet 1   simvastatin  (ZOCOR ) 80 MG tablet Take 1 tablet (80 mg total) by mouth daily. 90 tablet 1   glipiZIDE  (GLUCOTROL ) 5 MG tablet Take 1 tablet (5 mg total) by mouth daily. 90 tablet 1   loratadine  (CLARITIN ) 10 MG tablet Take 1 tablet (10 mg total) by mouth daily. (Patient not taking: Reported on 03/12/2024) 30 tablet 11   sodium chloride (OCEAN) 0.65 % SOLN nasal spray Place 1 spray into both nostrils as needed. (Patient not taking: Reported on 03/12/2024) 30 mL 0   valsartan  (DIOVAN ) 40 MG tablet Take 1 tablet (40 mg total) by mouth daily. (Patient not taking: Reported on 06/12/2024) 90 tablet 0   No facility-administered medications prior to  visit.    Allergies  Allergen Reactions   Amoxicillin  Swelling    Other reaction(s): swollen/rash   Rosuvastatin Calcium Other (See Comments)    ROS Review of Systems  Constitutional:  Negative for appetite change, chills, fatigue and fever.  HENT:  Positive for ear pain. Negative for congestion, postnasal drip, rhinorrhea and sneezing.   Respiratory:  Negative for cough, shortness of breath and wheezing.   Cardiovascular:  Negative for chest pain, palpitations and leg swelling.  Gastrointestinal:  Negative for abdominal pain, constipation, nausea and vomiting.  Genitourinary:  Negative for difficulty urinating, dysuria, flank pain and frequency.  Musculoskeletal:  Positive for arthralgias and neck pain. Negative for joint swelling and myalgias.  Skin:  Negative for color change, pallor, rash and wound.  Neurological:  Negative for facial asymmetry, weakness, numbness and headaches.  Psychiatric/Behavioral:  Negative for behavioral problems, confusion, self-injury and suicidal ideas.       Objective:    Physical Exam Vitals and nursing note reviewed. Exam conducted with a chaperone present.  Constitutional:      General: She is not in acute distress.    Appearance: Normal appearance. She is obese. She is not ill-appearing, toxic-appearing or diaphoretic.  Eyes:     General: No scleral icterus.       Right eye: No discharge.        Left eye: No discharge.     Extraocular Movements: Extraocular movements intact.     Conjunctiva/sclera: Conjunctivae normal.  Neck:     Comments: Tenderness on palpation of left side of the neck.  No redness or swelling noted Cardiovascular:     Rate and Rhythm: Normal rate and regular rhythm.     Pulses: Normal pulses.     Heart sounds: Normal heart sounds. No murmur heard.    No friction rub. No gallop.  Pulmonary:     Effort: Pulmonary effort is normal. No respiratory distress.     Breath sounds: Normal breath sounds. No stridor. No  wheezing, rhonchi or rales.  Chest:     Chest wall: No mass, lacerations, deformity, tenderness, crepitus or edema.  Breasts:    Tanner Score is 5.     Right: No swelling, bleeding, inverted nipple, mass, nipple discharge, skin change or tenderness.     Left: Tenderness present. No swelling, bleeding, inverted nipple, mass, nipple discharge or skin change.     Comments: Tenderness on the lateral aspect Left breast Abdominal:     General: There is no distension.     Palpations: Abdomen  is soft.     Tenderness: There is no abdominal tenderness. There is no right CVA tenderness, left CVA tenderness or guarding.  Musculoskeletal:        General: No swelling, deformity or signs of injury.     Cervical back: Tenderness present. No edema, erythema or rigidity. No pain with movement.     Right lower leg: No edema.     Left lower leg: No edema.  Lymphadenopathy:     Cervical: No cervical adenopathy.     Upper Body:     Right upper body: No supraclavicular, axillary or pectoral adenopathy.     Left upper body: No supraclavicular, axillary or pectoral adenopathy.  Skin:    General: Skin is warm and dry.     Capillary Refill: Capillary refill takes less than 2 seconds.     Coloration: Skin is not jaundiced or pale.     Findings: No bruising, erythema or lesion.  Neurological:     Mental Status: She is alert and oriented to person, place, and time.     Motor: No weakness.     Coordination: Coordination normal.     Gait: Gait normal.  Psychiatric:        Mood and Affect: Mood normal.        Behavior: Behavior normal.        Thought Content: Thought content normal.        Judgment: Judgment normal.     BP (!) 118/47   Pulse 69   Temp 98.2 F (36.8 C) (Oral)   Wt 139 lb 3.2 oz (63.1 kg)   SpO2 99%   BMI 23.89 kg/m  Wt Readings from Last 3 Encounters:  06/12/24 139 lb 3.2 oz (63.1 kg)  03/12/24 141 lb (64 kg)  01/09/24 142 lb 12.8 oz (64.8 kg)    Lab Results  Component Value  Date   TSH 3.510 05/02/2017   Lab Results  Component Value Date   WBC 5.8 01/09/2024   HGB 11.8 01/09/2024   HCT 36.1 01/09/2024   MCV 88 01/09/2024   PLT 271 01/09/2024   Lab Results  Component Value Date   NA 139 12/18/2023   K 4.3 12/18/2023   CO2 22 12/18/2023   GLUCOSE 145 (H) 12/18/2023   BUN 16 12/18/2023   CREATININE 0.71 12/18/2023   BILITOT 0.3 12/18/2023   ALKPHOS 92 12/18/2023   AST 25 12/18/2023   ALT 31 12/18/2023   PROT 7.4 12/18/2023   ALBUMIN 4.1 12/18/2023   CALCIUM 9.4 12/18/2023   ANIONGAP 5 01/01/2019   EGFR 100 12/18/2023   Lab Results  Component Value Date   CHOL 212 (H) 12/18/2023   Lab Results  Component Value Date   HDL 43 12/18/2023   Lab Results  Component Value Date   LDLCALC 153 (H) 12/18/2023   Lab Results  Component Value Date   TRIG 87 12/18/2023   Lab Results  Component Value Date   CHOLHDL 4.9 (H) 12/18/2023   Lab Results  Component Value Date   HGBA1C 7.4 (A) 06/12/2024      Assessment & Plan:   Assessment and Plan Assessment & Plan    Problem List Items Addressed This Visit       Endocrine   Uncontrolled type 2 diabetes mellitus with hyperglycemia (HCC) - Primary   Lab Results  Component Value Date   HGBA1C 7.4 (A) 06/12/2024   Improved glycemic control with A1c reduced from 8.7% to 7.4%. Goal is A1c  under 7% to reduce complications. No significant hypoglycemia reported. - Increase glipizide  to 5 mg orally twice daily, with meals. - Continue empagliflozin  25 mg orally daily. - Continue metformin  1000 mg orally twice daily with meals. - Continue pioglitazone  45 mg orally daily. - Advise to monitor blood sugar levels and report if consistently below 70 mg/dL. Advised to get her diabetic eye exam completed       Relevant Medications   glipiZIDE  (GLUCOTROL ) 5 MG tablet   Other Relevant Orders   POCT glycosylated hemoglobin (Hb A1C) (Completed)     Musculoskeletal and Integument   Eczema    Chronic  eczema with persistent itching. Current medication ineffective. - Prescribe stronger topical medication with 0.5% concentration, to be used twice daily for 1-2 weeks. - Refer to dermatologist for further evaluation and management.      Relevant Medications   triamcinolone  ointment (KENALOG ) 0.5 %   Other Relevant Orders   Ambulatory referral to Dermatology     Other   Mastalgia   Breast pain for one month, located under the breast. Possible referred pain from arthritis. - Order mammogram and/or ultrasound to evaluate breast pain.       Left ear pain   Left ear pain, chronic Chronic left ear pain with hearing issues. Previous ENT referral not followed up. - Provide contact information for ENT specialist and encourage scheduling an appointment.       Dyslipidemia, goal LDL below 70   Lab Results  Component Value Date   CHOL 212 (H) 12/18/2023   HDL 43 12/18/2023   LDLCALC 153 (H) 12/18/2023   LDLDIRECT 160 (H) 03/12/2024   TRIG 87 12/18/2023   CHOLHDL 4.9 (H) 12/18/2023   Goal is LDL cholesterol under 70 mg/dL. Ezetimibe  not taken consistently. - Continue simvastatin  80 mg orally daily. - Instruct to take ezetimibe  10 mg orally daily consistently. - Advise to avoid fatty and fried foods. - Schedule follow-up in 6 weeks for fasting lipid panel.      Neck pain   Chronic pain radiating to left chest and back.  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  Noted on CT scan done in 2023 Advised to take Tylenol  650 mg every 6 hours as needed Voltaren  gel prescribed, application of heat advised      Relevant Medications   diclofenac  Sodium (VOLTAREN  ARTHRITIS PAIN) 1 % GEL   Other Visit Diagnoses       Screening mammogram for breast cancer       Relevant Orders   MM 3D SCREENING MAMMOGRAM BILATERAL BREAST       Meds ordered this encounter  Medications   glipiZIDE  (GLUCOTROL ) 5 MG tablet    Sig: Take 1 tablet (5 mg total) by mouth 2 (two) times daily before a meal.     Dispense:  180 tablet    Refill:  1   diclofenac  Sodium (VOLTAREN  ARTHRITIS PAIN) 1 % GEL    Sig: Apply 4 g topically 4 (four) times daily.    Dispense:  50 g    Refill:  1   triamcinolone  ointment (KENALOG ) 0.5 %    Sig: Apply 1 Application topically 2 (two) times daily.    Dispense:  30 g    Refill:  0    Follow-up: Return in about 6 weeks (around 07/24/2024) for HYPERLIPIDEMIA.    Matasha Smigelski R Breonna Gafford, FNP

## 2024-06-12 NOTE — Assessment & Plan Note (Signed)
 Breast pain for one month, located under the breast. Possible referred pain from arthritis. - Order mammogram and/or ultrasound to evaluate breast pain.

## 2024-06-12 NOTE — Assessment & Plan Note (Signed)
 Lab Results  Component Value Date   HGBA1C 7.4 (A) 06/12/2024   Improved glycemic control with A1c reduced from 8.7% to 7.4%. Goal is A1c under 7% to reduce complications. No significant hypoglycemia reported. - Increase glipizide  to 5 mg orally twice daily, with meals. - Continue empagliflozin  25 mg orally daily. - Continue metformin  1000 mg orally twice daily with meals. - Continue pioglitazone  45 mg orally daily. - Advise to monitor blood sugar levels and report if consistently below 70 mg/dL. Advised to get her diabetic eye exam completed

## 2024-06-12 NOTE — Assessment & Plan Note (Signed)
  Chronic eczema with persistent itching. Current medication ineffective. - Prescribe stronger topical medication with 0.5% concentration, to be used twice daily for 1-2 weeks. - Refer to dermatologist for further evaluation and management.

## 2024-06-12 NOTE — Assessment & Plan Note (Signed)
 Left ear pain, chronic Chronic left ear pain with hearing issues. Previous ENT referral not followed up. - Provide contact information for ENT specialist and encourage scheduling an appointment.

## 2024-06-12 NOTE — Assessment & Plan Note (Signed)
 Lab Results  Component Value Date   CHOL 212 (H) 12/18/2023   HDL 43 12/18/2023   LDLCALC 153 (H) 12/18/2023   LDLDIRECT 160 (H) 03/12/2024   TRIG 87 12/18/2023   CHOLHDL 4.9 (H) 12/18/2023   Goal is LDL cholesterol under 70 mg/dL. Ezetimibe  not taken consistently. - Continue simvastatin  80 mg orally daily. - Instruct to take ezetimibe  10 mg orally daily consistently. - Advise to avoid fatty and fried foods. - Schedule follow-up in 6 weeks for fasting lipid panel.

## 2024-07-12 DIAGNOSIS — Z419 Encounter for procedure for purposes other than remedying health state, unspecified: Secondary | ICD-10-CM | POA: Diagnosis not present

## 2024-07-23 ENCOUNTER — Encounter: Attending: Nurse Practitioner | Admitting: Dietician

## 2024-07-23 ENCOUNTER — Encounter: Payer: Self-pay | Admitting: Dietician

## 2024-07-23 DIAGNOSIS — E119 Type 2 diabetes mellitus without complications: Secondary | ICD-10-CM | POA: Insufficient documentation

## 2024-07-23 NOTE — Progress Notes (Signed)
 Diabetes Self-Management Education  Visit Type: Follow-up  Appt. Start Time: 1045 Appt. End Time: 1115  07/23/2024  [name], identified by name and date of birth, is a 58 y.o. female with a diagnosis of Diabetes: Type 2.   ASSESSMENT  History includes: type 2 diabetes, anxiety, depression, HLD, HTN,  Labs noted: A1c 7.4% on 06/12/24.  Medications include: jardiance , glipizide , metformin , actos  Supplements: none reported  A1c down from 8.7% on 03/12/24 to 7.4% on 06/12/24.   Pt reports recently noticing feeling more hungry and shaky after taking her glipizide . Pt reports she has tried not taking it, and did not have symptoms, but reports when she takes it she feels shaky. Pt states she checked her blood glucose when this happened and it was 98mg /dL.    Pt reports she has been doing well with diet intake. Pt reports she still feels she needs to increase water (currently drinking 2 bottles per day).   Pt reports she does a lot of walking at her job [employer]), but has not had time to exercise outside of work.   Assessment of Previous Goals Established by Patient:   Goal 1: aim for 4 bottles of water daily. - goal in progress, continue.    Goal 2: go walking for 10 minutes daily. - pt reports she does a lot of walking at her job.   There were no vitals taken for this visit. There is no height or weight on file to calculate BMI.   Diabetes Self-Management Education - 07/23/24 1046       Visit Information   Visit Type Follow-up      Initial Visit   Diabetes Type Type 2    Are you currently following a meal plan? No    Are you taking your medications as prescribed? Yes      Health Coping   How would you rate your overall health? Good      Psychosocial Assessment   Patient Belief/Attitude about Diabetes Motivated to manage diabetes    What is the hardest part about your diabetes right now, causing you the most concern, or is the most worrisome to you about your  diabetes?   Making healty food and beverage choices    Self-care barriers None    Self-management support Doctor's office    Other persons present Patient    Patient Concerns Nutrition/Meal planning    Special Needs None    Preferred Learning Style No preference indicated    Learning Readiness Ready      Pre-Education Assessment   Patient understands the diabetes disease and treatment process. Needs Review    Patient understands incorporating nutritional management into lifestyle. Needs Review    Patient undertands incorporating physical activity into lifestyle. Needs Review    Patient understands using medications safely. Needs Review    Patient understands monitoring blood glucose, interpreting and using results Needs Review    Patient understands prevention, detection, and treatment of acute complications. Needs Review    Patient understands prevention, detection, and treatment of chronic complications. Needs Review    Patient understands how to develop strategies to address psychosocial issues. Needs Review    Patient understands how to develop strategies to promote health/change behavior. Needs Review      Complications   Last HgB A1C per patient/outside source 7.4 %    How often do you check your blood sugar? 1-2 times/day    Fasting Blood glucose range (mg/dL) [phone]      Dietary Intake  Breakfast 9am: tea, eggs and digestive biscuits and fruit    Snack (morning) none    Lunch 12pm: chicken, salad, bread, fruit    Snack (afternoon) none    Dinner 6pm: rice, okra, bread, fruit, vegetables    Snack (evening) none    Beverage(s) tea, 2 bottles water      Activity / Exercise   Activity / Exercise Type ADL's      Patient Education   Previous Diabetes Education Yes    Disease Pathophysiology Explored patient's options for treatment of their diabetes    Healthy Eating Role of diet in the treatment of diabetes and the relationship between the three main macronutrients and  blood glucose level;Plate Method;Reviewed blood glucose goals for pre and post meals and how to evaluate the patients' food intake on their blood glucose level.;Meal timing in regards to the patients' current diabetes medication.;Meal options for control of blood glucose level and chronic complications.    Being Active Helped patient identify appropriate exercises in relation to his/her diabetes, diabetes complications and other health issue.    Medications Reviewed patients medication for diabetes, action, purpose, timing of dose and side effects.    Monitoring Purpose and frequency of SMBG.;Identified appropriate SMBG and/or A1C goals.    Chronic complications Relationship between chronic complications and blood glucose control;Identified and discussed with patient  current chronic complications    Diabetes Stress and Support Identified and addressed patients feelings and concerns about diabetes    Lifestyle and Health Coping Lifestyle issues that need to be addressed for better diabetes care      Individualized Goals (developed by patient)   Nutrition General guidelines for healthy choices and portions discussed    Physical Activity Exercise 5-7 days per week;15 minutes per day    Medications take my medication as prescribed    Monitoring  Test my blood glucose as discussed    Problem Solving Eating Pattern    Reducing Risk examine blood glucose patterns;do foot checks daily;treat hypoglycemia with 15 grams of carbs if blood glucose less than 70mg /dL    Health Coping Ask for help with psychological, social, or emotional issues      Patient Self-Evaluation of Goals - Patient rates self as meeting previously set goals (% of time)   Nutrition >75% (most of the time)    Physical Activity < 25% (hardly ever/never)    Medications >75% (most of the time)    Monitoring >75% (most of the time)    Problem Solving and behavior change strategies  >75% (most of the time)    Reducing Risk (treating acute  and chronic complications) >75% (most of the time)    Health Coping >75% (most of the time)      Post-Education Assessment   Patient understands the diabetes disease and treatment process. Comprehends key points    Patient understands incorporating nutritional management into lifestyle. Demonstrates understanding / competency    Patient undertands incorporating physical activity into lifestyle. Comprehends key points    Patient understands using medications safely. Demonstrates understanding / competency    Patient understands monitoring blood glucose, interpreting and using results Demonstrates understanding / competency    Patient understands prevention, detection, and treatment of acute complications. Comprehends key points    Patient understands prevention, detection, and treatment of chronic complications. Comprehends key points    Patient understands how to develop strategies to address psychosocial issues. Comprehends key points    Patient understands how to develop strategies to promote health/change behavior. Comprehends  key points      Outcomes   Expected Outcomes Demonstrated interest in learning. Expect positive outcomes    Future DMSE PRN    Program Status Completed      Subsequent Visit   Since your last visit have you continued or begun to take your medications as prescribed? Yes          Individualized Plan for Diabetes Self-Management Training:   Learning Objective:  Patient will have a greater understanding of diabetes self-management. Patient education plan is to attend individual and/or group sessions per assessed needs and concerns.   Plan:   There are no Patient Instructions on file for this visit.  Expected Outcomes:  Demonstrated interest in learning. Expect positive outcomes  Education material provided: none on this follow up  If problems or questions, patient to contact team via:  Phone  Future DSME appointment: PRN

## 2024-07-26 ENCOUNTER — Encounter: Payer: Self-pay | Admitting: Nurse Practitioner

## 2024-07-26 ENCOUNTER — Ambulatory Visit: Payer: Self-pay | Admitting: Nurse Practitioner

## 2024-07-26 VITALS — BP 128/63 | HR 63 | Wt 140.0 lb

## 2024-07-26 DIAGNOSIS — M542 Cervicalgia: Secondary | ICD-10-CM

## 2024-07-26 DIAGNOSIS — E559 Vitamin D deficiency, unspecified: Secondary | ICD-10-CM | POA: Diagnosis not present

## 2024-07-26 DIAGNOSIS — R197 Diarrhea, unspecified: Secondary | ICD-10-CM

## 2024-07-26 DIAGNOSIS — E785 Hyperlipidemia, unspecified: Secondary | ICD-10-CM

## 2024-07-26 DIAGNOSIS — E1165 Type 2 diabetes mellitus with hyperglycemia: Secondary | ICD-10-CM

## 2024-07-26 DIAGNOSIS — L309 Dermatitis, unspecified: Secondary | ICD-10-CM | POA: Diagnosis not present

## 2024-07-26 MED ORDER — PIOGLITAZONE HCL 45 MG PO TABS: 45.0000 mg | ORAL_TABLET | Freq: Every day | ORAL | 1 refills | Status: DC

## 2024-07-26 MED ORDER — CLOBETASOL PROPIONATE 0.05 % EX CREA: 1.0000 | TOPICAL_CREAM | Freq: Two times a day (BID) | CUTANEOUS | 1 refills | Status: AC

## 2024-07-26 MED ORDER — VITAMIN D3 25 MCG (1000 UT) PO CAPS: 1000.0000 [IU] | ORAL_CAPSULE | Freq: Every day | ORAL | 1 refills | Status: AC

## 2024-07-26 MED ORDER — DICLOFENAC SODIUM 50 MG PO TBEC: 50.0000 mg | DELAYED_RELEASE_TABLET | Freq: Two times a day (BID) | ORAL | 0 refills | Status: AC | PRN

## 2024-07-26 NOTE — Patient Instructions (Signed)
 1. Vitamin D  deficiency (Primary)  - Cholecalciferol (VITAMIN D3) 25 MCG (1000 UT) CAPS; Take 1 capsule (1,000 Units total) by mouth daily.   2. Uncontrolled type 2 diabetes mellitus with hyperglycemia (HCC)  STOP GLIPIZIDE   - pioglitazone  (ACTOS ) 45 MG tablet; Take 1 tablet (45 mg total) by mouth daily.  Dispense: 90 tablet; Refill: 1   4. Neck pain  - diclofenac  (VOLTAREN ) 50 MG EC tablet; Take 1 tablet (50 mg total) by mouth 2 (two) times daily as needed.  Dispense: 30 tablet; Refill: 0 DO NOT TAKE ALEEVE OR IBUPROFEN  WHEN TAKING THIS MEDICATION . TAKES WITH FOOD    5. Eczema, unspecified type  - clobetasol  cream (TEMOVATE ) 0.05 %; Apply 1 Application topically 2 (two) times daily.   Apply twice daily for up to 2 weeks then use as needed  It is important that you exercise regularly at least 30 minutes 5 times a week as tolerated  Think about what you will eat, plan ahead. Choose  clean, green, fresh or frozen over canned, processed or packaged foods which are more sugary, salty and fatty. 70 to 75% of food eaten should be vegetables and fruit. Three meals at set times with snacks allowed between meals, but they must be fruit or vegetables. Aim to eat over a 12 hour period , example 7 am to 7 pm, and STOP after  your last meal of the day. Drink water,generally about 64 ounces per day, no other drink is as healthy. Fruit juice is best enjoyed in a healthy way, by EATING the fruit.  Thanks for choosing [facility] we consider it a privelige to serve you.

## 2024-07-26 NOTE — Assessment & Plan Note (Signed)
 Lab Results  Component Value Date   CHOL 212 (H) 12/18/2023   HDL 43 12/18/2023   LDLCALC 153 (H) 12/18/2023   LDLDIRECT 160 (H) 03/12/2024   TRIG 87 12/18/2023   CHOLHDL 4.9 (H) 12/18/2023   Dyslipidemia Cholesterol level 160. Goal to reduce LDL to less than 70 to decrease cardiovascular risk. - Continue simvastatin  80 mg daily. - Continue ezetimibe  10 mg daily. - Check lipid panel today

## 2024-07-26 NOTE — Assessment & Plan Note (Signed)
 Possible gastroenteritis Diarrhea now improving Advised to drink at least 64 ounces of water daily to maintain hydration

## 2024-07-26 NOTE — Assessment & Plan Note (Addendum)
 Prefers Temovate  0.05% cream, med ordered for eczema, Kenalog  cream Discontinued

## 2024-07-26 NOTE — Assessment & Plan Note (Addendum)
 Last vitamin D  Lab Results  Component Value Date   VD25OH 21.8 (L) 01/09/2024   - Recommend over-the-counter vitamin D  1000 units daily. - Send prescription for vitamin D 

## 2024-07-26 NOTE — Assessment & Plan Note (Signed)
 Chronic neck pain Voltaren  gel recommended but not purchased. Diclofenac  50 mg twice daily as needed ordered, advised to take with food and avoid taking Aleve  or ibuprofen  while taking medication

## 2024-07-26 NOTE — Progress Notes (Signed)
 Established Patient Office Visit  Subjective:  Patient ID: [name], female    DOB: 30-Oct-1966  Age: 59 y.o. MRN: [id number]  CC: No chief complaint on file.   HPI  Discussed the use of AI scribe software for clinical note transcription with the patient, who gave verbal consent to proceed.  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), Arthralgia (05/05/2014), Cervical strain (04/07/2015), Chronic headaches (01/06/2014), Dandruff (01/06/2014), Depressed, Diabetes mellitus (HCC) (01/17/2013), Diabetes mellitus without complication (HCC), Dysuria (12/19/2012), Eczema (04/04/2017), Fatigue (04/04/2017), Fatty liver, History DEPRESSION (03/10/2007), History of barium enema, History of pelvic ultrasound (09/2005), Hyperlipidemia, Hypertension, Lactose intolerance (08/13/2015), Left axillary pain (02/12/2013), Left genital labial abscess (06/05/2014), Lower abdominal pain (12/07/2012), MASTALGIA (03/10/2007), Pain, dental (12/08/2016), Right upper quadrant abdominal pain (01/17/2013), Shoulder pain, left (09/04/2012), Sinus congestion (03/12/2013), Sinusitis, chronic (09/30/2013), Somatic complaints, multiple, SYMPTOM, PAIN, ABDOMINAL, OTH SPECIFIED SITE (01/10/2005), and 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 who presents for follow-up on her cholesterol levels. She is accompanied by her son.  She is following up on her cholesterol levels, previously recorded at 160 mg/dL. She has been taking simvastatin  80mg  daily and Zetia  10 mg daily for cholesterol management.  She has diabetes and is currently taking metformin  1000 mg twice a day,  and Jardiance  25mg  daily.  She was prescribed glipizide  5 mg twice a day, but she reports that it causes shakiness, and her blood sugar readings have dropped to 89-90 mg/dL. She stopped taking Actos  recently and is confused about her medication regimen. Her A1c improved from 8.7% to 7.4% after starting glipizide  5mg  daily , but she stated that she never started glipizide  even though she reported taking glipizide  at her last visit.    She has experienced diarrhea recently, lasting 2 days and causing weakness, which she does not think that metformin  is responsible for her diarrhea because she usually tolerates it well. She has not eaten today and drank orange juice to alleviate her weakness.  Her diet includes cheese, fruit, brown rice, and brown bread. She avoids juice, soda, cake, and cookies. She has attended a diabetes nutrition class recently consulted a nutritionist.  She experiences neck pain and uses diclofenac  as needed for pain relief. She was prescribed voltaren  gel but she prefers diclofenac   pills      Assessment & Plan   Past Medical History:  Diagnosis Date   Abdominal pain, epigastric 06/11/2015   Abdominal ultrasound, abnormal    normal   Abnormal abdominal CT scan 01/20/2005   and pelvic CT, normal    Allergic dermatitis 09/17/2015   ALLERGIC RHINITIS WITH CONJUNCTIVITIS 06/30/2009   Qualifier: Diagnosis of  By: Adella MD, Elizabeth     Amenorrhea 03/29/2013   Anxiety with somatization 03/12/2013   Arthralgia 05/05/2014   Cervical strain 04/07/2015   Chronic headaches 01/06/2014   Dandruff 01/06/2014   Depressed    Diabetes mellitus (HCC) 01/17/2013   Diabetes mellitus without complication (HCC)    Dysuria 12/19/2012   Eczema 04/04/2017   Fatigue 04/04/2017   Fatty liver    History DEPRESSION 03/10/2007   Qualifier: Diagnosis of  By: Rufus Gower tech, Delon     History of barium enema    normal   History of pelvic ultrasound 09/2005   normal  Hyperlipidemia    Hypertension     Lactose intolerance 08/13/2015   Left axillary pain 02/12/2013   Left genital labial abscess 06/05/2014   Lower abdominal pain 12/07/2012   MASTALGIA 03/10/2007   Qualifier: Diagnosis of  By: Rufus Gower tech, Jennifer     Pain, dental 12/08/2016   Right upper quadrant abdominal pain 01/17/2013   Shoulder pain, left 09/04/2012   Sinus congestion 03/12/2013   Sinusitis, chronic 09/30/2013   Somatic complaints, multiple    SYMPTOM, PAIN, ABDOMINAL, OTH SPECIFIED SITE 01/10/2005   Qualifier: Diagnosis of  By: Rufus Gower tech, Delon BURKES (WOLFE)-PARKINSON-WHITE (WPW) SYNDROME 11/04/2010   Follows with Dr. Waddell at [facility]. Last visit was 2012. Instructed that if pt develops palpitations, then catheter ablation would be recommended.       Past Surgical History:  Procedure Laterality Date   APPENDECTOMY     caesarean section     x2   CHOLECYSTECTOMY  2009    Family History  Problem Relation Age of Onset   Throat cancer Father        non-smoker   Diabetes Mother    Hypertension Mother     Social History   Socioeconomic History   Marital status: Married    Spouse name: Not on file   Number of children: 3   Years of education: Not on file   Highest education level: Not on file  Occupational History   Not on file  Tobacco Use   Smoking status: Never   Smokeless tobacco: Never  Substance and Sexual Activity   Alcohol use: No    Alcohol/week: 0.0 standard drinks of alcohol   Drug use: No   Sexual activity: Not Currently  Other Topics Concern   Not on file  Social History Narrative   Originally from [place]. 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 Lives at home with husband and 3 children.    Social Drivers of Corporate investment banker Strain: Not on file  Food Insecurity: No Food Insecurity (06/11/2024)   Hunger Vital Sign    Worried About Running Out of Food in the Last Year: Never true    Ran Out of Food in  the Last Year: Never true  Transportation Needs: No Transportation Needs (06/12/2024)   PRAPARE - Administrator, Civil Service (Medical): No    Lack of Transportation (Non-Medical): No  Physical Activity: Not on file  Stress: Not on file  Social Connections: Not on file  Intimate Partner Violence: Not on file    Outpatient Medications Prior to Visit  Medication Sig Dispense Refill   empagliflozin  (JARDIANCE ) 25 MG TABS tablet Take 1 tablet (25 mg total) by mouth daily. 90 tablet 1   ezetimibe  (ZETIA ) 10 MG tablet Take 1 tablet (10 mg total) by mouth daily. 90 tablet 1   metFORMIN  (GLUCOPHAGE ) 1000 MG tablet Take 1 tablet (1,000 mg total) by mouth 2 (two) times daily with a meal. 180 tablet 3   simvastatin  (ZOCOR ) 80 MG tablet Take 1 tablet (80 mg total) by mouth daily. 90 tablet 1   clobetasol  cream (TEMOVATE ) 0.05 % Apply 1 Application topically 2 (two) times daily.     glipiZIDE  (GLUCOTROL ) 5 MG tablet Take 1 tablet (5 mg total) by mouth 2 (two) times daily before a meal. 180 tablet 1   ibuprofen  (ADVIL ) 800 MG tablet Take 1 tablet (800 mg total) by mouth every 8 (  eight) hours as needed. 30 tablet 0   benzonatate  (TESSALON  PERLES) 100 MG capsule Take 1 capsule (100 mg total) by mouth 3 (three) times daily as needed for cough. 30 capsule 0   fluticasone  (FLONASE ) 50 MCG/ACT nasal spray Place 2 sprays into both nostrils daily. (Patient not taking: Reported on 07/26/2024) 11.1 mL 3   loratadine  (CLARITIN ) 10 MG tablet Take 1 tablet (10 mg total) by mouth daily. (Patient not taking: Reported on 07/26/2024) 30 tablet 11   sodium chloride (OCEAN) 0.65 % SOLN nasal spray Place 1 spray into both nostrils as needed. (Patient not taking: Reported on 07/26/2024) 30 mL 0   diclofenac  Sodium (VOLTAREN  ARTHRITIS PAIN) 1 % GEL Apply 4 g topically 4 (four) times daily. 50 g 1   pioglitazone  (ACTOS ) 45 MG tablet Take 1 tablet (45 mg total) by mouth daily. (Patient not taking: Reported on 07/26/2024)  90 tablet 1   triamcinolone  ointment (KENALOG ) 0.5 % Apply 1 Application topically 2 (two) times daily. (Patient not taking: Reported on 07/26/2024) 30 g 0   No facility-administered medications prior to visit.    Allergies  Allergen Reactions   Amoxicillin  Swelling    Other reaction(s): swollen/rash   Rosuvastatin Calcium Other (See Comments)    ROS Review of Systems  Constitutional:  Negative for appetite change, chills, fatigue and fever.  HENT:  Negative for congestion, postnasal drip, rhinorrhea and sneezing.   Respiratory:  Negative for cough, shortness of breath and wheezing.   Cardiovascular:  Negative for chest pain, palpitations and leg swelling.  Gastrointestinal:  Positive for abdominal pain and diarrhea. Negative for constipation, nausea and vomiting.  Genitourinary:  Negative for difficulty urinating, dysuria, flank pain and frequency.  Musculoskeletal:  Positive for arthralgias. Negative for joint swelling and myalgias.  Skin:  Positive for rash. Negative for color change, pallor and wound.  Neurological:  Negative for dizziness, facial asymmetry, weakness, numbness and headaches.  Psychiatric/Behavioral:  Negative for behavioral problems, confusion, self-injury and suicidal ideas.       Objective:    Physical Exam Vitals and nursing note reviewed.  Constitutional:      General: She is not in acute distress.    Appearance: Normal appearance. She is not ill-appearing, toxic-appearing or diaphoretic.  Eyes:     General: No scleral icterus.       Right eye: No discharge.        Left eye: No discharge.     Extraocular Movements: Extraocular movements intact.     Conjunctiva/sclera: Conjunctivae normal.  Cardiovascular:     Rate and Rhythm: Normal rate and regular rhythm.     Pulses: Normal pulses.     Heart sounds: Normal heart sounds. No murmur heard.    No friction rub. No gallop.  Pulmonary:     Effort: Pulmonary effort is normal. No respiratory distress.      Breath sounds: Normal breath sounds. No stridor. No wheezing, rhonchi or rales.  Chest:     Chest wall: No tenderness.  Abdominal:     General: There is no distension.     Palpations: Abdomen is soft.     Tenderness: There is no abdominal tenderness. There is no right CVA tenderness, left CVA tenderness or guarding.     Comments: Tenderness left lower quadrant  Musculoskeletal:        General: No swelling, deformity or signs of injury.     Cervical back: Tenderness present.     Right lower leg: No edema.  Left lower leg: No edema.  Skin:    General: Skin is warm and dry.     Capillary Refill: Capillary refill takes less than 2 seconds.     Coloration: Skin is not jaundiced or pale.     Findings: Rash present. No bruising, erythema or lesion.  Neurological:     Mental Status: She is alert and oriented to person, place, and time.     Motor: No weakness.     Coordination: Coordination normal.     Gait: Gait normal.  Psychiatric:        Mood and Affect: Mood normal.        Behavior: Behavior normal.        Thought Content: Thought content normal.        Judgment: Judgment normal.     BP 128/63   Pulse 63   Wt 140 lb (63.5 kg)   SpO2 100%   BMI 24.03 kg/m  Wt Readings from Last 3 Encounters:  07/26/24 140 lb (63.5 kg)  06/12/24 139 lb 3.2 oz (63.1 kg)  03/12/24 141 lb (64 kg)    Lab Results  Component Value Date   TSH 3.510 05/02/2017   Lab Results  Component Value Date   WBC 5.8 01/09/2024   HGB 11.8 01/09/2024   HCT 36.1 01/09/2024   MCV 88 01/09/2024   PLT 271 01/09/2024   Lab Results  Component Value Date   NA 139 12/18/2023   K 4.3 12/18/2023   CO2 22 12/18/2023   GLUCOSE 145 (H) 12/18/2023   BUN 16 12/18/2023   CREATININE 0.71 12/18/2023   BILITOT 0.3 12/18/2023   ALKPHOS 92 12/18/2023   AST 25 12/18/2023   ALT 31 12/18/2023   PROT 7.4 12/18/2023   ALBUMIN 4.1 12/18/2023   CALCIUM 9.4 12/18/2023   ANIONGAP 5 01/01/2019   EGFR 100  12/18/2023   Lab Results  Component Value Date   CHOL 212 (H) 12/18/2023   Lab Results  Component Value Date   HDL 43 12/18/2023   Lab Results  Component Value Date   LDLCALC 153 (H) 12/18/2023   Lab Results  Component Value Date   TRIG 87 12/18/2023   Lab Results  Component Value Date   CHOLHDL 4.9 (H) 12/18/2023   Lab Results  Component Value Date   HGBA1C 7.4 (A) 06/12/2024      Assessment & Plan:   Problem List Items Addressed This Visit       Endocrine   Uncontrolled type 2 diabetes mellitus with hyperglycemia (HCC)   Lab Results  Component Value Date   HGBA1C 7.4 (A) 06/12/2024   Type 2 diabetes mellitus with hyperglycemia Managed with metformin , Jardiance , and Actos . Glipizide  discontinued due to hypoglycemia. target less than 7 to reduce cardiovascular risk. - Continue metformin  1000 mg twice daily. - Continue Jardiance  25 mg daily. - Restart Actos  45 mg daily. - Discontinue glipizide . - Monitor A1c at next visit and adjust medications if necessary. - Advise on dietary modifications to reduce carbohydrate intake.       Relevant Medications   pioglitazone  (ACTOS ) 45 MG tablet   Other Relevant Orders   Lipid panel   Basic Metabolic Panel     Musculoskeletal and Integument   Eczema   Prefers Temovate  0.05% cream, med ordered for eczema, Kenalog  cream Discontinued      Relevant Medications   clobetasol  cream (TEMOVATE ) 0.05 %     Other   Dyslipidemia, goal LDL below 70  Lab Results  Component Value Date   CHOL 212 (H) 12/18/2023   HDL 43 12/18/2023   LDLCALC 153 (H) 12/18/2023   LDLDIRECT 160 (H) 03/12/2024   TRIG 87 12/18/2023   CHOLHDL 4.9 (H) 12/18/2023   Dyslipidemia Cholesterol level 160. Goal to reduce LDL to less than 70 to decrease cardiovascular risk. - Continue simvastatin  80 mg daily. - Continue ezetimibe  10 mg daily. - Check lipid panel today        Relevant Orders   Lipid panel   Neck pain   Chronic neck  pain Voltaren  gel recommended but not purchased. Diclofenac  50 mg twice daily as needed ordered, advised to take with food and avoid taking Aleve  or ibuprofen  while taking medication       Relevant Medications   diclofenac  (VOLTAREN ) 50 MG EC tablet   Vitamin D  deficiency - Primary   Last vitamin D  Lab Results  Component Value Date   VD25OH 21.8 (L) 01/09/2024   - Recommend over-the-counter vitamin D  1000 units daily. - Send prescription for vitamin D          Relevant Medications   Cholecalciferol (VITAMIN D3) 25 MCG (1000 UT) CAPS   Other Relevant Orders   VITAMIN D  25 Hydroxy (Vit-D Deficiency, Fractures)   Diarrhea   Possible gastroenteritis Diarrhea now improving Advised to drink at least 64 ounces of water daily to maintain hydration       Meds ordered this encounter  Medications   Cholecalciferol (VITAMIN D3) 25 MCG (1000 UT) CAPS    Sig: Take 1 capsule (1,000 Units total) by mouth daily.    Dispense:  90 capsule    Refill:  1   diclofenac  (VOLTAREN ) 50 MG EC tablet    Sig: Take 1 tablet (50 mg total) by mouth 2 (two) times daily as needed.    Dispense:  30 tablet    Refill:  0   clobetasol  cream (TEMOVATE ) 0.05 %    Sig: Apply 1 Application topically 2 (two) times daily.    Dispense:  30 g    Refill:  1   pioglitazone  (ACTOS ) 45 MG tablet    Sig: Take 1 tablet (45 mg total) by mouth daily.    Dispense:  90 tablet    Refill:  1    Follow-up: Return in about 2 months (around 09/25/2024) for DM.    Fredrika Canby R Theran Vandergrift, FNP

## 2024-07-26 NOTE — Assessment & Plan Note (Signed)
 Lab Results  Component Value Date   HGBA1C 7.4 (A) 06/12/2024   Type 2 diabetes mellitus with hyperglycemia Managed with metformin , Jardiance , and Actos . Glipizide  discontinued due to hypoglycemia. target less than 7 to reduce cardiovascular risk. - Continue metformin  1000 mg twice daily. - Continue Jardiance  25 mg daily. - Restart Actos  45 mg daily. - Discontinue glipizide . - Monitor A1c at next visit and adjust medications if necessary. - Advise on dietary modifications to reduce carbohydrate intake.

## 2024-07-27 LAB — LIPID PANEL
Chol/HDL Ratio: 4.7 ratio — ABNORMAL HIGH (ref 0.0–4.4)
Cholesterol, Total: 223 mg/dL — ABNORMAL HIGH (ref 100–199)
HDL: 47 mg/dL (ref >39)
LDL Chol Calc (NIH): 152 mg/dL — ABNORMAL HIGH (ref 0–99)
Triglycerides: 134 mg/dL (ref 0–149)
VLDL Cholesterol Cal: 24 mg/dL (ref 5–40)

## 2024-07-27 LAB — VITAMIN D 25 HYDROXY (VIT D DEFICIENCY, FRACTURES): Vit D, 25-Hydroxy: 20.8 ng/mL — ABNORMAL LOW (ref 30.0–100.0)

## 2024-07-27 LAB — BASIC METABOLIC PANEL WITH GFR
BUN/Creatinine Ratio: 20 (ref 9–23)
BUN: 15 mg/dL (ref 6–24)
CO2: 22 mmol/L (ref 20–29)
Calcium: 9.1 mg/dL (ref 8.7–10.2)
Chloride: 106 mmol/L (ref 96–106)
Creatinine, Ser: 0.75 mg/dL (ref 0.57–1.00)
Glucose: 99 mg/dL (ref 70–99)
Potassium: 4.4 mmol/L (ref 3.5–5.2)
Sodium: 142 mmol/L (ref 134–144)
eGFR: 93 mL/min/1.73 (ref >59)

## 2024-07-29 ENCOUNTER — Ambulatory Visit: Payer: Self-pay | Admitting: Nurse Practitioner

## 2024-07-29 ENCOUNTER — Other Ambulatory Visit: Payer: Self-pay | Admitting: Nurse Practitioner

## 2024-07-29 DIAGNOSIS — E785 Hyperlipidemia, unspecified: Secondary | ICD-10-CM

## 2024-07-29 MED ORDER — REPATHA SURECLICK 140 MG/ML ~~LOC~~ SOAJ: 140.0000 mg | SUBCUTANEOUS | 2 refills | Status: DC

## 2024-07-30 ENCOUNTER — Other Ambulatory Visit: Payer: Self-pay | Admitting: Nurse Practitioner

## 2024-07-30 DIAGNOSIS — J309 Allergic rhinitis, unspecified: Secondary | ICD-10-CM

## 2024-07-30 DIAGNOSIS — E785 Hyperlipidemia, unspecified: Secondary | ICD-10-CM

## 2024-08-14 ENCOUNTER — Other Ambulatory Visit: Payer: Self-pay

## 2024-08-14 ENCOUNTER — Telehealth: Payer: Self-pay

## 2024-08-14 NOTE — Telephone Encounter (Signed)
 Pharmacy Patient Advocate Encounter  Received notification from [facility] MEDICAID that Prior Authorization for REPATHA SURECLICK has been APPROVED from 08/14/2024 to 08/14/2025   PA #/Case ID/Reference #: [id number]

## 2024-08-23 ENCOUNTER — Ambulatory Visit (INDEPENDENT_AMBULATORY_CARE_PROVIDER_SITE_OTHER): Admitting: Audiology

## 2024-08-23 ENCOUNTER — Encounter (INDEPENDENT_AMBULATORY_CARE_PROVIDER_SITE_OTHER): Payer: Self-pay | Admitting: Physician Assistant

## 2024-08-23 ENCOUNTER — Ambulatory Visit (INDEPENDENT_AMBULATORY_CARE_PROVIDER_SITE_OTHER): Admitting: Physician Assistant

## 2024-08-23 VITALS — BP 137/73 | HR 71 | Temp 98.1°F | Ht 64.0 in | Wt 141.0 lb

## 2024-08-23 DIAGNOSIS — H903 Sensorineural hearing loss, bilateral: Secondary | ICD-10-CM

## 2024-08-23 DIAGNOSIS — H9202 Otalgia, left ear: Secondary | ICD-10-CM

## 2024-08-23 DIAGNOSIS — M542 Cervicalgia: Secondary | ICD-10-CM

## 2024-08-23 NOTE — Progress Notes (Signed)
  [address] [place], [place] [address] [phone]  Audiological Evaluation    Name: [name]     DOB:   04/01/1966      MRN:   [id number]                                                                                     Service Date: 08/23/2024     Accompanied by: daughter   Patient comes today after Shelly Cohen, PA-C sent a referral for a hearing evaluation due to concerns with hearing loss.   Symptoms Yes Details  Hearing loss  [x]  Daughter reports she cannot hear high pitched sounds  Tinnitus  []    Ear pain/ infections/pressure  [x]  Pain in the left ear that comes and goes, itchiness  Balance problems  [x]  Lightheaded or missteps when walking  Noise exposure history  []    Previous ear surgeries  []    Family history of hearing loss  []    Amplification  []    Other  []      Otoscopy: Right ear: Clear external ear canal and notable landmarks visualized on the tympanic membrane. Left ear:  Clear external ear canal and notable landmarks visualized on the tympanic membrane.  Tympanometry: Right ear: Normal external ear canal volume with normal middle ear pressure and tympanic membrane compliance (Type A). Findings are suggestive of normal middle ear function. Left ear: Normal external ear canal volume with normal middle ear pressure and tympanic membrane compliance (Type A). Findings are suggestive of normal middle ear function.   Hearing Evaluation The hearing test results were completed under headphones and results are deemed to be of good reliability. 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  Speech Audiometry: Right ear- Speech Reception Threshold (SRT) was obtained at 30 dBHL. Left  ear-Speech Reception Threshold (SRT) was obtained at 25 dBHL.   Word Recognition Score Tested using NU-6 (recorded) Right ear: 68% was obtained at a presentation level of 85 dBHL with contralateral masking which is deemed as  poor. Left ear: 60% was obtained at a presentation level of 85 dBHL with contralateral masking which is deemed as  poor. Of note- scores may have been affected  because patient's first language is Arabic/Sudanese.  Impression: There is not a significant difference in pure-tone thresholds between ears. There is not a significant difference in the word recognition score in between ears.    Recommendations: Follow up with ENT as scheduled for today. Return for a hearing evaluation if concerns with hearing changes arise or per MD recommendation. Consider a communication needs assessment after medical clearance for hearing aids is obtained.   [name], AUD

## 2024-08-23 NOTE — Progress Notes (Signed)
 Dear Dr. Juanice, Here is my assessment for our mutual patient, [name]. Thank you for allowing me the opportunity to care for your patient. Please do not hesitate to contact me should you have any other questions. Sincerely, Chyrl Cohen PA-C  Otolaryngology Clinic Note Referring provider: Dr. Juanice HPI:  [name] is a 58 y.o. female kindly referred by Dr. Juanice   The patient is a 58 year old female seen in our office for evaluation of left ear pain.  The patient notes that over the last 2 months she has had pain in the left ear, she notes some swelling around the ear.  She notes some decreased hearing out of the ear as well, some itchy sensation.  She notes the ear is worse when sleeping on it.  She does note that this has been longstanding but worse over the last several months.  She denies any associated infection or drainage.  She does note pain is worse when palpating the muscles of the left lateral neck.  She notes a history of cervical spine disease as well.     Independent Review of Additional Tests or Records:  Audiological evaluation 08/23/2024  Otoscopy: Right ear: Clear external ear canal and notable landmarks visualized on the tympanic membrane. Left ear:  Clear external ear canal and notable landmarks visualized on the tympanic membrane.   Tympanometry: Right ear: Normal external ear canal volume with normal middle ear pressure and tympanic membrane compliance (Type A). Findings are suggestive of normal middle ear function. Left ear: Normal external ear canal volume with normal middle ear pressure and tympanic membrane compliance (Type A). Findings are suggestive of normal middle ear function.     Hearing Evaluation The hearing test results were completed under headphones and results are deemed to be of good reliability. 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 Left ear-  Normal hearing from 216 659 2683 Hz, then moderate to profound sensorineural hearing loss from 1500 Hz - 8000 Hz.   Speech Audiometry: Right ear- Speech Reception Threshold (SRT) was obtained at 30 dBHL. Left ear-Speech Reception Threshold (SRT) was obtained at 25 dBHL.   Word Recognition Score Tested using NU-6 (recorded) Right ear: 68% was obtained at a presentation level of 85 dBHL with contralateral masking which is deemed as  poor. Left ear: 60% was obtained at a presentation level of 85 dBHL with contralateral masking which is deemed as  poor. Of note- scores may have been affected  because patient's first language is Arabic/Sudanese.   Impression: There is not a significant difference in pure-tone thresholds between ears. There is not a significant difference in the word recognition score in between ears.    Recommendations: Follow up with ENT as scheduled for today. Return for a hearing evaluation if concerns with hearing changes arise or per MD recommendation. Consider a communication needs assessment after medical clearance for hearing aids is obtained.   PMH/Meds/All/SocHx/FamHx/ROS:   Past Medical History:  Diagnosis Date   Abdominal pain, epigastric 06/11/2015   Abdominal ultrasound, abnormal    normal   Abnormal abdominal CT scan 01/20/2005   and pelvic CT, normal    Allergic dermatitis 09/17/2015   ALLERGIC RHINITIS WITH CONJUNCTIVITIS 06/30/2009   Qualifier: Diagnosis of  By: Adella MD, Elizabeth     Amenorrhea 03/29/2013   Anxiety with somatization 03/12/2013   Arthralgia 05/05/2014   Cervical  strain 04/07/2015   Chronic headaches 01/06/2014   Dandruff 01/06/2014   Depressed    Diabetes mellitus (HCC) 01/17/2013   Diabetes mellitus without complication (HCC)    Dysuria 12/19/2012   Eczema 04/04/2017   Fatigue 04/04/2017   Fatty liver    History DEPRESSION 03/10/2007   Qualifier: Diagnosis of  By: Rufus Gower tech, Delon     History of barium enema    normal   History of pelvic ultrasound 09/2005   normal    Hyperlipidemia    Hypertension    Lactose intolerance 08/13/2015   Left axillary pain 02/12/2013   Left genital labial abscess 06/05/2014   Lower abdominal pain 12/07/2012   MASTALGIA 03/10/2007   Qualifier: Diagnosis of  By: Rufus Gower tech, Jennifer     Pain, dental 12/08/2016   Right upper quadrant abdominal pain 01/17/2013   Shoulder pain, left 09/04/2012   Sinus congestion 03/12/2013   Sinusitis, chronic 09/30/2013   Somatic complaints, multiple    SYMPTOM, PAIN, ABDOMINAL, OTH SPECIFIED SITE 01/10/2005   Qualifier: Diagnosis of  By: Rufus Gower tech, Jennifer     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 Waddell at [facility]. Last visit was 2012. Instructed that if pt develops palpitations, then catheter ablation would be recommended.        Past Surgical History:  Procedure Laterality Date   APPENDECTOMY     caesarean section     x2   CHOLECYSTECTOMY  2009    Family History  Problem Relation Age of Onset   Throat cancer Father        non-smoker   Diabetes Mother    Hypertension Mother      Social Connections: Not on file      Current Outpatient Medications:    Cholecalciferol (VITAMIN D3) 25 MCG (1000 UT) CAPS, Take 1 capsule (1,000 Units total) by mouth daily., Disp: 90 capsule, Rfl: 1   clobetasol  cream (TEMOVATE ) 0.05 %, Apply 1 Application topically 2 (two) times daily., Disp: 30 g, Rfl: 1   diclofenac  (VOLTAREN ) 50 MG EC tablet, Take 1 tablet (50 mg total) by mouth 2 (two) times daily as needed., Disp: 30 tablet, Rfl: 0   empagliflozin  (JARDIANCE ) 25 MG TABS tablet, Take 1 tablet (25 mg total) by mouth daily., Disp: 90 tablet, Rfl: 1   Evolocumab (REPATHA SURECLICK) 140 MG/ML SOAJ, Inject 140 mg into the skin every 14 (fourteen) days., Disp: 2 mL, Rfl: 2   ezetimibe  (ZETIA ) 10 MG tablet, Take 1 tablet (10  mg total) by mouth daily., Disp: 90 tablet, Rfl: 1   fluticasone  (FLONASE ) 50 MCG/ACT nasal spray, Place 2 sprays into both nostrils daily. (Patient not taking: Reported on 07/26/2024), Disp: 11.1 mL, Rfl: 3   loratadine  (CLARITIN ) 10 MG tablet, Take 1 tablet (10 mg total) by mouth daily. (Patient not taking: Reported on 07/26/2024), Disp: 30 tablet, Rfl: 11   metFORMIN  (GLUCOPHAGE ) 1000 MG tablet, Take 1 tablet (1,000 mg total) by mouth 2 (two) times daily with a meal., Disp: 180 tablet, Rfl: 3   pioglitazone  (ACTOS ) 45 MG tablet, Take 1 tablet (45 mg total) by mouth daily., Disp: 90 tablet, Rfl: 1   simvastatin  (ZOCOR ) 80 MG tablet, Take 1 tablet (80 mg total) by mouth daily., Disp: 90 tablet, Rfl: 1   sodium chloride (OCEAN) 0.65 % SOLN nasal spray, Place 1 spray into both nostrils as needed. (Patient not taking: Reported on 07/26/2024), Disp: 30 mL, Rfl: 0  Physical Exam:   BP 137/73   Pulse 71   Temp 98.1 F (36.7 C)   Ht 5' 4 (1.626 m)   Wt 141 lb (64 kg)   SpO2 98%   BMI 24.20 kg/m   Pertinent Findings  CN II-XII intact Bilateral EAC clear and TM intact with well pneumatized middle ear spaces Weber 512: equal Rinne 512: AC > BC b/l  Anterior rhinoscopy: Septum midline; bilateral inferior turbinates with no hypertrophy No lesions of oral cavity/oropharynx; dentition is in normal limits No obviously palpable neck masses/lymphadenopathy/thyromegaly, point tenderness at the left sternocleidomastoid No respiratory distress or stridor  Seprately Identifiable Procedures:  None  Impression & Plans:  [name] is a 58 y.o. female with the following   Left ear pain-  I have high suspicion that the patient's left-sided ear pain is secondary to pain in the sternocleidomastoid muscle.  She has point tenderness I am able to reproduce her symptoms.  She also has cervical spine disease and does note some correlation between when she is having more cervical's pain and pain in the  lateral neck and ear.  Overall her ear exam is benign with no acute findings.  Reassuring audiological evaluation.  The patient would likely benefit from physical therapy, have to place an order for her.  I would recommend continued outpatient follow-up with her primary care provider for ongoing symptoms.  She may return at any point with any questions or concerns she may have.   - f/u PRN   Thank you for allowing me the opportunity to care for your patient. Please do not hesitate to contact me should you have any other questions.  Sincerely, Chyrl Cohen PA-C [facility] Phone: [phone] Fax: [phone]  08/23/2024, 2:30 PM

## 2024-08-28 ENCOUNTER — Encounter: Payer: Self-pay | Admitting: Audiology

## 2024-09-04 DIAGNOSIS — H60502 Unspecified acute noninfective otitis externa, left ear: Secondary | ICD-10-CM | POA: Diagnosis not present

## 2024-09-04 DIAGNOSIS — J029 Acute pharyngitis, unspecified: Secondary | ICD-10-CM | POA: Diagnosis not present

## 2024-09-10 ENCOUNTER — Encounter: Admitting: Dietician

## 2024-09-11 ENCOUNTER — Ambulatory Visit

## 2024-09-11 DIAGNOSIS — R110 Nausea: Secondary | ICD-10-CM | POA: Diagnosis not present

## 2024-09-11 DIAGNOSIS — R109 Unspecified abdominal pain: Secondary | ICD-10-CM | POA: Diagnosis not present

## 2024-09-11 DIAGNOSIS — K529 Noninfective gastroenteritis and colitis, unspecified: Secondary | ICD-10-CM | POA: Diagnosis not present

## 2024-09-11 DIAGNOSIS — H6692 Otitis media, unspecified, left ear: Secondary | ICD-10-CM | POA: Diagnosis not present

## 2024-09-11 NOTE — Therapy (Incomplete)
 OUTPATIENT PHYSICAL THERAPY CERVICAL EVALUATION   Patient Name: [name] MRN: [id number] DOB:09/09/1966, 58 y.o., female Today's Date: 09/11/2024  END OF SESSION:   Past Medical History:  Diagnosis Date   Abdominal pain, epigastric 06/11/2015   Abdominal ultrasound, abnormal    normal   Abnormal abdominal CT scan 01/20/2005   and pelvic CT, normal    Allergic dermatitis 09/17/2015   ALLERGIC RHINITIS WITH CONJUNCTIVITIS 06/30/2009   Qualifier: Diagnosis of  By: Adella MD, Elizabeth     Amenorrhea 03/29/2013   Anxiety with somatization 03/12/2013   Arthralgia 05/05/2014   Cervical strain 04/07/2015   Chronic headaches 01/06/2014   Dandruff 01/06/2014   Depressed    Diabetes mellitus (HCC) 01/17/2013   Diabetes mellitus without complication (HCC)    Dysuria 12/19/2012   Eczema 04/04/2017   Fatigue 04/04/2017   Fatty liver    History DEPRESSION 03/10/2007   Qualifier: Diagnosis of  By: Rufus Gower tech, Delon     History of barium enema    normal   History of pelvic ultrasound 09/2005   normal    Hyperlipidemia    Hypertension    Lactose intolerance 08/13/2015   Left axillary pain 02/12/2013   Left genital labial abscess 06/05/2014   Lower abdominal pain 12/07/2012   MASTALGIA 03/10/2007   Qualifier: Diagnosis of  By: Rufus Gower tech, Jennifer     Pain, dental 12/08/2016   Right upper quadrant abdominal pain 01/17/2013   Shoulder pain, left 09/04/2012   Sinus congestion 03/12/2013   Sinusitis, chronic 09/30/2013   Somatic complaints, multiple    SYMPTOM, PAIN, ABDOMINAL, OTH SPECIFIED SITE 01/10/2005   Qualifier: Diagnosis of  By: Rufus Gower tech, Jennifer     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 Waddell at [facility]. Last visit was 2012. Instructed that if pt develops palpitations, then catheter ablation would be recommended.      Past Surgical History:  Procedure Laterality Date   APPENDECTOMY      caesarean section     x2   CHOLECYSTECTOMY  2009   Patient Active Problem List   Diagnosis Date Noted   Vitamin D  deficiency 07/26/2024   Diarrhea 07/26/2024   Neck pain 06/12/2024   Acute cough 01/09/2024   Uncontrolled type 2 diabetes mellitus with hyperglycemia (HCC) 12/12/2023   High blood pressure 12/12/2023   Left ear pain 12/12/2023   Hearing trouble, left 12/12/2023   Right hip pain 12/12/2023   Dyslipidemia, goal LDL below 70 12/12/2023   Eczema 04/04/2017   Fatigue 04/04/2017   Allergic dermatitis 09/17/2015   Lactose intolerance 08/13/2015   Cervical strain 04/07/2015   Arthralgia 05/05/2014   Chronic headaches 01/06/2014   Amenorrhea 03/29/2013   Diabetes mellitus (HCC) 01/17/2013   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  Rationale for Evaluation and Treatment: Rehabilitation  ONSET DATE: ***  SUBJECTIVE:  SUBJECTIVE STATEMENT: ***  Hand dominance: {MISC; OT HAND DOMINANCE:[phone]}  PERTINENT HISTORY:  ***  PAIN:  Are you having pain?  Yes: NPRS scale: *** Pain location: *** Pain description: *** Aggravating factors: *** Relieving factors: ***  PRECAUTIONS: {Therapy precautions:24002}  RED FLAGS: {PT Red Flags:29287}     WEIGHT BEARING RESTRICTIONS: {Yes ***/No:24003}  FALLS:  Has patient fallen in last 6 months? {fallsyesno:27318}  LIVING ENVIRONMENT: Lives with: {[facility] lives with:25569::lives with their family} Lives in: {Lives in:25570} Stairs: {opstairs:27293} Has following equipment at home:  {Assistive devices:23999}  OCCUPATION: ***  PLOF: {PLOF:24004}  PATIENT GOALS: ***  NEXT MD VISIT: ***  OBJECTIVE:  Note: Objective measures were completed at Evaluation unless otherwise noted.  DIAGNOSTIC FINDINGS:  ***  PATIENT SURVEYS:  {rehab surveys:24030}  COGNITION: Overall cognitive status: {cognition:24006}  SENSATION: {sensation:27233}  POSTURE: {posture:25561}  PALPATION: ***   CERVICAL ROM:   {AROM/PROM:27142} ROM A/PROM (deg) eval  Flexion   Extension   Right lateral flexion   Left lateral flexion   Right rotation   Left rotation    (Blank rows = not tested)  UPPER EXTREMITY ROM:  {AROM/PROM:27142} ROM Right eval Left eval  Shoulder flexion    Shoulder extension    Shoulder abduction    Shoulder adduction    Shoulder extension    Shoulder internal rotation    Shoulder external rotation    Elbow flexion    Elbow extension    Wrist flexion    Wrist extension    Wrist ulnar deviation    Wrist radial deviation    Wrist pronation    Wrist supination     (Blank rows = not tested)  UPPER EXTREMITY MMT:  MMT Right eval Left eval  Shoulder flexion    Shoulder extension    Shoulder abduction    Shoulder adduction    Shoulder extension    Shoulder internal rotation    Shoulder external rotation    Middle trapezius    Lower trapezius    Elbow flexion    Elbow extension    Wrist flexion    Wrist extension    Wrist ulnar deviation    Wrist radial deviation    Wrist pronation    Wrist supination    Grip strength     (Blank rows = not tested)  CERVICAL SPECIAL TESTS:  {Cervical special tests:25246}  FUNCTIONAL TESTS:  {Functional tests:24029}  TREATMENT: [facility] Adult PT Treatment:                                                DATE: *** Therapeutic Exercise: *** Manual Therapy: *** Neuromuscular re-ed: *** Therapeutic Activity: *** Modalities: *** Self Care: ***  PATIENT EDUCATION:  Education details: eval  findings, NDI, HEP, POC Person educated: Patient Education method: Explanation, Demonstration, and Handouts Education comprehension: verbalized understanding and returned demonstration  HOME EXERCISE PROGRAM: ***  ASSESSMENT:  CLINICAL IMPRESSION: Patient is a *** y.o. *** who was seen today for physical therapy evaluation and treatment for ***.   OBJECTIVE IMPAIRMENTS: {opptimpairments:25111}.   ACTIVITY LIMITATIONS: {activitylimitations:27494}  PARTICIPATION LIMITATIONS: {participationrestrictions:25113}  PERSONAL FACTORS: {Personal factors:25162} are also affecting patient's functional outcome.   REHAB POTENTIAL: {rehabpotential:25112}  CLINICAL DECISION MAKING: {clinical decision making:25114}  EVALUATION COMPLEXITY: {Evaluation complexity:25115}   GOALS: Goals reviewed with patient? No  SHORT TERM GOALS: Target date: 10/02/2024   Pt  will be compliant and knowledgeable with initial HEP for improved comfort and carryover Baseline: initial HEP given  Goal status: INITIAL  2.  Pt will self report *** pain no greater than ***/10 for improved comfort and functional ability Baseline: ***/10 at worst Goal status: {GOALSTATUS:25110}   LONG TERM GOALS: Target date: ***  Pt will decrease NDI disability score to no greater than ***% as proxy for functional improvement Baseline: ***% disability Goal status: {GOALSTATUS:25110}   2.  *** Baseline:  Goal status: INITIAL  3.  *** Baseline:  Goal status: INITIAL  4.  *** Baseline:  Goal status: INITIAL  5.  *** Baseline:  Goal status: INITIAL  6.  *** Baseline:  Goal status: INITIAL   PLAN:  PT FREQUENCY: {rehab frequency:25116}  PT DURATION: {rehab duration:25117}  PLANNED INTERVENTIONS: {rehab planned interventions:25118::97110-Therapeutic exercises,97530- Therapeutic [id number]- Neuromuscular re-education,97535- Self Rjmz,02859- Manual therapy,Patient/Family education}  PLAN FOR NEXT  SESSION: PIERRETTE Alm JAYSON Johna, PT 09/11/2024, 8:09 AM

## 2024-09-17 ENCOUNTER — Ambulatory Visit: Attending: Physician Assistant

## 2024-09-17 ENCOUNTER — Other Ambulatory Visit: Payer: Self-pay

## 2024-09-17 DIAGNOSIS — M542 Cervicalgia: Secondary | ICD-10-CM | POA: Insufficient documentation

## 2024-09-17 NOTE — Therapy (Signed)
 OUTPATIENT PHYSICAL THERAPY CERVICAL EVALUATION   Patient Name: [name] MRN: [id number] DOB:February 28, 1966, 58 y.o., female Today's Date: 09/17/2024  END OF SESSION:  PT End of Session - 09/17/24 1530     Visit Number 1    Number of Visits 8    Date for Recertification  10/15/24    Authorization Type [employer] NEXT/ [employer] MCD    Progress Note Due on Visit 8    PT Start Time 1530    PT Stop Time 1615    PT Time Calculation (min) 45 min    Activity Tolerance Patient tolerated treatment well    Behavior During Therapy WFL for tasks assessed/performed          Past Medical History:  Diagnosis Date   Abdominal pain, epigastric 06/11/2015   Abdominal ultrasound, abnormal    normal   Abnormal abdominal CT scan 01/20/2005   and pelvic CT, normal    Allergic dermatitis 09/17/2015   ALLERGIC RHINITIS WITH CONJUNCTIVITIS 06/30/2009   Qualifier: Diagnosis of  By: Adella MD, Elizabeth     Amenorrhea 03/29/2013   Anxiety with somatization 03/12/2013   Arthralgia 05/05/2014   Cervical strain 04/07/2015   Chronic headaches 01/06/2014   Dandruff 01/06/2014   Depressed    Diabetes mellitus (HCC) 01/17/2013   Diabetes mellitus without complication (HCC)    Dysuria 12/19/2012   Eczema 04/04/2017   Fatigue 04/04/2017   Fatty liver    History DEPRESSION 03/10/2007   Qualifier: Diagnosis of  By: Rufus Gower tech, Delon     History of barium enema    normal   History of pelvic ultrasound 09/2005   normal    Hyperlipidemia    Hypertension    Lactose intolerance 08/13/2015   Left axillary pain 02/12/2013   Left genital labial abscess 06/05/2014   Lower abdominal pain 12/07/2012   MASTALGIA 03/10/2007   Qualifier: Diagnosis of  By: Rufus Gower tech, Jennifer     Pain, dental 12/08/2016   Right upper quadrant abdominal pain 01/17/2013   Shoulder pain, left 09/04/2012   Sinus congestion 03/12/2013   Sinusitis, chronic 09/30/2013   Somatic complaints, multiple     SYMPTOM, PAIN, ABDOMINAL, OTH SPECIFIED SITE 01/10/2005   Qualifier: Diagnosis of  By: Rufus Gower tech, Jennifer     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 Waddell at [facility]. Last visit was 2012. Instructed that if pt develops palpitations, then catheter ablation would be recommended.      Past Surgical History:  Procedure Laterality Date   APPENDECTOMY     caesarean section     x2   CHOLECYSTECTOMY  2009   Patient Active Problem List   Diagnosis Date Noted   Vitamin D  deficiency 07/26/2024   Diarrhea 07/26/2024   Neck pain 06/12/2024   Acute cough 01/09/2024   Uncontrolled type 2 diabetes mellitus with hyperglycemia (HCC) 12/12/2023   High blood pressure 12/12/2023   Left ear pain 12/12/2023   Hearing trouble, left 12/12/2023   Right hip pain 12/12/2023   Dyslipidemia, goal LDL below 70 12/12/2023   Eczema 04/04/2017   Fatigue 04/04/2017   Allergic dermatitis 09/17/2015   Lactose intolerance 08/13/2015   Cervical strain 04/07/2015   Arthralgia 05/05/2014   Chronic headaches 01/06/2014   Amenorrhea 03/29/2013   Diabetes mellitus (HCC) 01/17/2013   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   Allergic rhinitis 06/30/2009   Mastalgia 03/10/2007    PCP: Paseda, Folashade R, FNP  REFERRING PROVIDER: Palmer Purchase,  PA-C  REFERRING DIAG: M54.2 (ICD-10-CM) - Neck pain   THERAPY DIAG:  Neck pain  Rationale for Evaluation and Treatment: Rehabilitation  ONSET DATE: few years ago   SUBJECTIVE:                                                                                                                                                                                                         SUBJECTIVE STATEMENT: Pt reports neck pain that has been on and off for a few years with this last episode starting 2 months ago. She reports the left side being worse and even causing L ear pain. She saw an ENT who  determined the issue is likely muscular in nature. Her pain has not changed since onset and bothers her when lifting objects, opening bottles, and reaching overhead. She notes sleeping is troublesome. The pt denies trauma and n/t.   Hand dominance: Right  PERTINENT HISTORY:  Chronic neck pain HTN Uncontrolled DM II  PAIN:  Are you having pain? Yes: NPRS scale: 3/10 Pain location: B upper traps radiating up and down (not below the shoulder)  Pain description: sharp Aggravating factors: lifting, opening bottles Relieving factors: walking  PRECAUTIONS: None  RED FLAGS: Cervical red flags: Dysphagia No, Dysmetria No, Diplopia No, Nystagmus No, and Nausea No     WEIGHT BEARING RESTRICTIONS: No  FALLS:  Has patient fallen in last 6 months? No  LIVING ENVIRONMENT: Lives with: lives with their family Lives in: House/apartment Has following equipment at home: None  OCCUPATION: teacher   PLOF: Independent  PATIENT GOALS: have less pain  NEXT MD VISIT: none scheduled   OBJECTIVE:  Note: Objective measures were completed at Evaluation unless otherwise noted.  DIAGNOSTIC FINDINGS:  CT scan of neck in 2023 - Skeleton: There is mild degenerative change C6-C7. There is no acute osseous abnormality or suspicious osseous lesion.  PATIENT SURVEYS:  NDI: will collect next session  NECK DISABILITY INDEX  Date: 09/24/2024 Score  Pain intensity 2  2. Personal care (washing, dressing, etc.) 0  3. Lifting 1  4. Reading 2  5. Headaches 1  6. Concentration 0  7. Work 1  8. Driving 5  9. Sleeping 3  10. Recreation 1  Total 16/50   Minimum Detectable Change (90% confidence): 5 points or 10% points  COGNITION: Overall cognitive status: Within functional limits for tasks assessed  SENSATION: WFL  POSTURE: rounded shoulders, forward head, and increased thoracic kyphosis  PALPATION: Tenderness over B upper traps, cervical spinal processes, proximal attachment of SCM on L  CERVICAL ROM:   Active ROM AROM eval  Flexion 50% limited*   Extension 50% limited*  Right lateral flexion 25% limited*   Left lateral flexion 25% limited*   Right rotation 25% limited*   Left rotation 25% limited*    (Blank rows = not tested)(* = pain)   UPPER EXTREMITY ROM:  Active ROM Right eval Left eval  Shoulder flexion    Shoulder extension WNL WNL  Shoulder abduction 160* 160*  Shoulder adduction    Shoulder internal rotation    Shoulder external rotation WNL WNL  Elbow flexion    Elbow extension    Wrist flexion    Wrist extension    Wrist ulnar deviation    Wrist radial deviation    Wrist pronation    Wrist supination     (Blank rows = not tested)(* = pain)   Cervical MMT:  MMT Eval  Flexion  5/5  Extension  5/5  L rotation  5/5*  R rotation  5/5*   (Blank rows = not tested)   CERVICAL SPECIAL TESTS:  Spurling's test: Positive  TREATMENT DATE:  09/17/2024 Therapeutic Exercise                                                                                                            SNAGs Towel assisted cervical extension  First rib mob with towel B   PATIENT EDUCATION:  Education details: diagnosis, prognosis, HEP, POC. Person educated: Patient and Spouse Education method: Explanation, Demonstration, Tactile cues, Verbal cues, and Handouts Education comprehension: verbalized understanding, returned demonstration, and verbal cues required  HOME EXERCISE PROGRAM: Access Code: [email or web address] URL: [email or web address] Date: 09/17/2024 Prepared by: Marijo Berber  Exercises - Seated Assisted Cervical Rotation with Towel  - 2-3 x daily - 7 x weekly - 1 sets - 5 reps - 10s hold - Cervical Extension AROM with Strap  - 2-3 x daily - 7 x weekly - 1 sets - 5 reps - First Rib Mobilization with Strap  - 2-3 x daily - 7 x weekly - 1 sets - 5 reps - 10s hold - Supine Shoulder Flexion with Dowel  - 2-3 x daily - 7 x weekly - 1 sets - 10  reps - 5s hold   ASSESSMENT:  CLINICAL IMPRESSION: Patient is a 58 y.o. female who was seen today for physical therapy evaluation and treatment for neck pain. Pt is limited in her ability to lift, reach, and perform ADLs. She has poor cervical ROM due to pain, tenderness and hypomobility due to pain, but full cervical strength. After observation, she has poor scapulohumeral rhythm B. The pt will benefit from skilled physical therapy to return decrease pain and increase function.     OBJECTIVE IMPAIRMENTS: decreased mobility, decreased ROM, decreased strength, hypomobility, impaired flexibility, and pain.   ACTIVITY LIMITATIONS: lifting, sleeping, and reach over head  PARTICIPATION LIMITATIONS: meal prep, cleaning, and occupation  PERSONAL FACTORS: Past/current experiences, Social background, and 1-2 comorbidities: DM II, HTN are also affecting patient's functional outcome.   REHAB POTENTIAL: Good  CLINICAL DECISION MAKING: Evolving/moderate complexity  EVALUATION COMPLEXITY: Moderate   GOALS: Goals reviewed with patient? Yes  SHORT TERM GOALS: Target date: 10/01/2024  Pt will be compliant and independent with HEP to assist with symptom management/recovery at home.  Baseline: LGLYAPFV Goal status: INITIAL  2.  Pt will demonstrate full B shoulder ABD AROM to assist with reaching tasks.  Baseline: 160 B  Goal status: INITIAL   LONG TERM GOALS: Target date: 10/15/2024  Pt will report 50% or greater improvement since the start of PT.  Baseline: 0% Goal status: INITIAL  2.  Pt will demonstrate 25% improvement in cervical AROM to assist with sleeping and ADLs.  Baseline: see objective  Goal status: INITIAL  3.  Pt will be able to lift her purse without pain to assist with QOL and ADLs.  Baseline: pain  Goal status: INITIAL   PLAN:  PT FREQUENCY: 1-2x/week  PT DURATION: 4 weeks  PLANNED INTERVENTIONS: 97110-Therapeutic exercises, 97530- Therapeutic activity, 97112-  Neuromuscular re-education, 97535- Self Care, 02859- Manual therapy, 20560 (1-2 muscles), 20561 (3+ muscles)- Dry Needling, Patient/Family education, Joint mobilization, Joint manipulation, Spinal manipulation, Spinal mobilization, Cryotherapy, and Moist heat  For all possible CPT codes, reference the Planned Interventions line above.     Check all conditions that are expected to impact treatment: {Conditions expected to impact treatment:Diabetes mellitus, Musculoskeletal disorders, and Psychological or psychiatric disorders   If treatment provided at initial evaluation, no treatment charged due to lack of authorization.       PLAN FOR NEXT SESSION: collect NDI, introduce isometrics of CS, shoulder mobility, review HEP    Marijo DELENA Berber, PT 09/17/2024, 4:23 PM

## 2024-09-24 ENCOUNTER — Ambulatory Visit

## 2024-09-24 DIAGNOSIS — M542 Cervicalgia: Secondary | ICD-10-CM | POA: Diagnosis not present

## 2024-09-24 NOTE — Therapy (Addendum)
 OUTPATIENT PHYSICAL THERAPY TREATMENT NOTE   Patient Name: [name] MRN: [id number] DOB:[birth year], 58 y.o., female Today's Date: 09/24/2024  END OF SESSION:  PT End of Session - 09/24/24 1451     Visit Number 2    Number of Visits 8    Date for Recertification  10/15/24    Authorization Type [employer] NEXT/ [employer] MCD    PT Start Time 1452   patient arrived late   PT Stop Time 1525    PT Time Calculation (min) 33 min    Activity Tolerance Patient tolerated treatment well    Behavior During Therapy WFL for tasks assessed/performed          Past Medical History:  Diagnosis Date   Abdominal pain, epigastric 06/11/2015   Abdominal ultrasound, abnormal    normal   Abnormal abdominal CT scan 01/20/2005   and pelvic CT, normal    Allergic dermatitis 09/17/2015   ALLERGIC RHINITIS WITH CONJUNCTIVITIS 06/30/2009   Qualifier: Diagnosis of  By: Adella MD, Elizabeth     Amenorrhea 03/29/2013   Anxiety with somatization 03/12/2013   Arthralgia 05/05/2014   Cervical strain 04/07/2015   Chronic headaches 01/06/2014   Dandruff 01/06/2014   Depressed    Diabetes mellitus (HCC) 01/17/2013   Diabetes mellitus without complication (HCC)    Dysuria 12/19/2012   Eczema 04/04/2017   Fatigue 04/04/2017   Fatty liver    History DEPRESSION 03/10/2007   Qualifier: Diagnosis of  By: Rufus Gower tech, Delon     History of barium enema    normal   History of pelvic ultrasound 09/2005   normal    Hyperlipidemia    Hypertension    Lactose intolerance 08/13/2015   Left axillary pain 02/12/2013   Left genital labial abscess 06/05/2014   Lower abdominal pain 12/07/2012   MASTALGIA 03/10/2007   Qualifier: Diagnosis of  By: Rufus Gower tech, Jennifer     Pain, dental 12/08/2016   Right upper quadrant abdominal pain 01/17/2013   Shoulder pain, left 09/04/2012   Sinus congestion 03/12/2013   Sinusitis, chronic 09/30/2013   Somatic complaints, multiple    SYMPTOM,  PAIN, ABDOMINAL, OTH SPECIFIED SITE 01/10/2005   Qualifier: Diagnosis of  By: Rufus Gower tech, Jennifer     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 Waddell at [facility]. Last visit was 2012. Instructed that if pt develops palpitations, then catheter ablation would be recommended.      Past Surgical History:  Procedure Laterality Date   APPENDECTOMY     caesarean section     x2   CHOLECYSTECTOMY  2009   Patient Active Problem List   Diagnosis Date Noted   Vitamin D  deficiency 07/26/2024   Diarrhea 07/26/2024   Neck pain 06/12/2024   Acute cough 01/09/2024   Uncontrolled type 2 diabetes mellitus with hyperglycemia (HCC) 12/12/2023   High blood pressure 12/12/2023   Left ear pain 12/12/2023   Hearing trouble, left 12/12/2023   Right hip pain 12/12/2023   Dyslipidemia, goal LDL below 70 12/12/2023   Eczema 04/04/2017   Fatigue 04/04/2017   Allergic dermatitis 09/17/2015   Lactose intolerance 08/13/2015   Cervical strain 04/07/2015   Arthralgia 05/05/2014   Chronic headaches 01/06/2014   Amenorrhea 03/29/2013   Diabetes mellitus (HCC) 01/17/2013   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   Allergic rhinitis 06/30/2009   Mastalgia 03/10/2007    PCP: Paseda, Folashade R, FNP  REFERRING PROVIDER: Palmer Purchase, PA-C  REFERRING DIAG: M54.2 (  ICD-10-CM) - Neck pain   THERAPY DIAG:  Neck pain  Rationale for Evaluation and Treatment: Rehabilitation  ONSET DATE: few years ago   SUBJECTIVE:                                                                                                                                                                                                         SUBJECTIVE STATEMENT: Patient reports that she isn't having much neck pain today, the pain is more in her shoulder.  EVAL:Pt reports neck pain that has been on and off for a few years with this last episode starting 2 months ago. She  reports the left side being worse and even causing L ear pain. She saw an ENT who determined the issue is likely muscular in nature. Her pain has not changed since onset and bothers her when lifting objects, opening bottles, and reaching overhead. She notes sleeping is troublesome. The pt denies trauma and n/t.   Hand dominance: Right  PERTINENT HISTORY:  Chronic neck pain HTN Uncontrolled DM II  PAIN:  Are you having pain? Yes: NPRS scale: 3/10 Pain location: B upper traps radiating up and down (not below the shoulder)  Pain description: sharp Aggravating factors: lifting, opening bottles Relieving factors: walking  PRECAUTIONS: None  RED FLAGS: Cervical red flags: Dysphagia No, Dysmetria No, Diplopia No, Nystagmus No, and Nausea No     WEIGHT BEARING RESTRICTIONS: No  FALLS:  Has patient fallen in last 6 months? No  LIVING ENVIRONMENT: Lives with: lives with their family Lives in: House/apartment Has following equipment at home: None  OCCUPATION: teacher   PLOF: Independent  PATIENT GOALS: have less pain  NEXT MD VISIT: none scheduled   OBJECTIVE:  Note: Objective measures were completed at Evaluation unless otherwise noted.  DIAGNOSTIC FINDINGS:  CT scan of neck in 2023 - Skeleton: There is mild degenerative change C6-C7. There is no acute osseous abnormality or suspicious osseous lesion.  PATIENT SURVEYS:  NDI: will collect next session  NECK DISABILITY INDEX  Date: 09/24/2024 Score  Pain intensity 2  2. Personal care (washing, dressing, etc.) 0  3. Lifting 1  4. Reading 2  5. Headaches 1  6. Concentration 0  7. Work 1  8. Driving 5  9. Sleeping 3  10. Recreation 1  Total 16/50   Minimum Detectable Change (90% confidence): 5 points or 10% points  COGNITION: Overall cognitive status: Within functional limits for tasks assessed  SENSATION: WFL  POSTURE: rounded shoulders, forward head, and increased thoracic kyphosis  PALPATION: Tenderness  over  B upper traps, cervical spinal processes, proximal attachment of SCM on L    CERVICAL ROM:   Active ROM AROM eval  Flexion 50% limited*   Extension 50% limited*  Right lateral flexion 25% limited*   Left lateral flexion 25% limited*   Right rotation 25% limited*   Left rotation 25% limited*    (Blank rows = not tested)(* = pain)   UPPER EXTREMITY ROM:  Active ROM Right eval Left eval  Shoulder flexion    Shoulder extension WNL WNL  Shoulder abduction 160* 160*  Shoulder adduction    Shoulder internal rotation    Shoulder external rotation WNL WNL  Elbow flexion    Elbow extension    Wrist flexion    Wrist extension    Wrist ulnar deviation    Wrist radial deviation    Wrist pronation    Wrist supination     (Blank rows = not tested)(* = pain)   Cervical MMT:  MMT Eval  Flexion  5/5  Extension  5/5  L rotation  5/5*  R rotation  5/5*   (Blank rows = not tested)   CERVICAL SPECIAL TESTS:  Spurling's test: Positive  TREATMENT DATE:  [facility] Adult PT Treatment:                                                DATE: 09/24/24 Therapeutic Exercise: Supine shoulder flexion with dowel 2x10 Supine shoulder abduction with dowel 2x10 Supine chest press with dowel Seated horizontal abduction 2x10 RTB Seated ER 2x10 RTB UT stretch BIL 30 SNAGs Seated ITYs x10 ea  Therapeutic Activity: Standing rows GTB 2x10 Standing shoulder extension GTB 2x10   09/17/2024 Therapeutic Exercise                                                                                                            SNAGs Towel assisted cervical extension  First rib mob with towel B   PATIENT EDUCATION:  Education details: diagnosis, prognosis, HEP, POC. Person educated: Patient and Spouse Education method: Explanation, Demonstration, Tactile cues, Verbal cues, and Handouts Education comprehension: verbalized understanding, returned demonstration, and verbal cues required  HOME EXERCISE  PROGRAM: Access Code: [email or web address] URL: [email or web address] Date: 09/17/2024 Prepared by: Marijo Berber  Exercises - Seated Assisted Cervical Rotation with Towel  - 2-3 x daily - 7 x weekly - 1 sets - 5 reps - 10s hold - Cervical Extension AROM with Strap  - 2-3 x daily - 7 x weekly - 1 sets - 5 reps - First Rib Mobilization with Strap  - 2-3 x daily - 7 x weekly - 1 sets - 5 reps - 10s hold - Supine Shoulder Flexion with Dowel  - 2-3 x daily - 7 x weekly - 1 sets - 10 reps - 5s hold   ASSESSMENT:  CLINICAL IMPRESSION: Patient presents to PT reporting that she feels okay today, her pain is  more in her shoulder than her neck today. Patient was able to tolerate all exercises well. Today's session focused on increasing cervical mobility and periscapular strength. She did have increased pain at end range of shoulder abduction. Patient will benefit from skilled PT in order to increase functional mobility and independence.   EVAL:Patient is a 57 y.o. female who was seen today for physical therapy evaluation and treatment for neck pain. Pt is limited in her ability to lift, reach, and perform ADLs. She has poor cervical ROM due to pain, tenderness and hypomobility due to pain, but full cervical strength. After observation, she has poor scapulohumeral rhythm B. The pt will benefit from skilled physical therapy to return decrease pain and increase function.     OBJECTIVE IMPAIRMENTS: decreased mobility, decreased ROM, decreased strength, hypomobility, impaired flexibility, and pain.   ACTIVITY LIMITATIONS: lifting, sleeping, and reach over head  PARTICIPATION LIMITATIONS: meal prep, cleaning, and occupation  PERSONAL FACTORS: Past/current experiences, Social background, and 1-2 comorbidities: DM II, HTN are also affecting patient's functional outcome.   REHAB POTENTIAL: Good  CLINICAL DECISION MAKING: Evolving/moderate complexity  EVALUATION COMPLEXITY: Moderate   GOALS: Goals  reviewed with patient? Yes  SHORT TERM GOALS: Target date: 10/01/2024  Pt will be compliant and independent with HEP to assist with symptom management/recovery at home.  Baseline: [email or web address] Goal status: INITIAL  2.  Pt will demonstrate full B shoulder ABD AROM to assist with reaching tasks.  Baseline: 160 B  Goal status: INITIAL   LONG TERM GOALS: Target date: 10/15/2024  Pt will report 50% or greater improvement since the start of PT.  Baseline: 0% Goal status: INITIAL  2.  Pt will demonstrate 25% improvement in cervical AROM to assist with sleeping and ADLs.  Baseline: see objective  Goal status: INITIAL  3.  Pt will be able to lift her purse without pain to assist with QOL and ADLs.  Baseline: pain  Goal status: INITIAL   PLAN:  PT FREQUENCY: 1-2x/week  PT DURATION: 4 weeks  PLANNED INTERVENTIONS: 97110-Therapeutic exercises, 97530- Therapeutic activity, 97112- Neuromuscular re-education, 97535- Self Care, 02859- Manual therapy, 20560 (1-2 muscles), 20561 (3+ muscles)- Dry Needling, Patient/Family education, Joint mobilization, Joint manipulation, Spinal manipulation, Spinal mobilization, Cryotherapy, and Moist heat  For all possible CPT codes, reference the Planned Interventions line above.     Check all conditions that are expected to impact treatment: {Conditions expected to impact treatment:Diabetes mellitus, Musculoskeletal disorders, and Psychological or psychiatric disorders   If treatment provided at initial evaluation, no treatment charged due to lack of authorization.       PLAN FOR NEXT SESSION: collect NDI, introduce isometrics of CS, shoulder mobility, review HEP    Shanda Code, SPTA 09/24/2024, 3:32 PM

## 2024-09-25 ENCOUNTER — Ambulatory Visit (INDEPENDENT_AMBULATORY_CARE_PROVIDER_SITE_OTHER): Payer: Self-pay | Admitting: Nurse Practitioner

## 2024-09-25 ENCOUNTER — Encounter: Payer: Self-pay | Admitting: Nurse Practitioner

## 2024-09-25 VITALS — BP 145/70 | HR 76 | Wt 146.3 lb

## 2024-09-25 DIAGNOSIS — R1011 Right upper quadrant pain: Secondary | ICD-10-CM

## 2024-09-25 DIAGNOSIS — E1165 Type 2 diabetes mellitus with hyperglycemia: Secondary | ICD-10-CM

## 2024-09-25 DIAGNOSIS — G8929 Other chronic pain: Secondary | ICD-10-CM | POA: Diagnosis not present

## 2024-09-25 DIAGNOSIS — M545 Low back pain, unspecified: Secondary | ICD-10-CM | POA: Diagnosis not present

## 2024-09-25 DIAGNOSIS — E785 Hyperlipidemia, unspecified: Secondary | ICD-10-CM

## 2024-09-25 DIAGNOSIS — Z0000 Encounter for general adult medical examination without abnormal findings: Secondary | ICD-10-CM | POA: Insufficient documentation

## 2024-09-25 DIAGNOSIS — R1013 Epigastric pain: Secondary | ICD-10-CM | POA: Diagnosis not present

## 2024-09-25 DIAGNOSIS — R197 Diarrhea, unspecified: Secondary | ICD-10-CM

## 2024-09-25 DIAGNOSIS — I10 Essential (primary) hypertension: Secondary | ICD-10-CM

## 2024-09-25 LAB — POCT URINE DIPSTICK
Bilirubin, UA: NEGATIVE
Blood, UA: NEGATIVE
Glucose, UA: 100 mg/dL — AB
Ketones, POC UA: NEGATIVE mg/dL
Leukocytes, UA: NEGATIVE
Nitrite, UA: NEGATIVE
POC PROTEIN,UA: NEGATIVE
Spec Grav, UA: 1.01 (ref 1.010–1.025)
Urobilinogen, UA: 0.2 U/dL
pH, UA: 5.5 (ref 5.0–8.0)

## 2024-09-25 LAB — POCT GLYCOSYLATED HEMOGLOBIN (HGB A1C): Hemoglobin A1C: 7.6 % — AB (ref 4.0–5.6)

## 2024-09-25 MED ORDER — METFORMIN HCL 1000 MG PO TABS
1000.0000 mg | ORAL_TABLET | Freq: Two times a day (BID) | ORAL | 3 refills | Status: AC
Start: 2024-09-25 — End: ?

## 2024-09-25 MED ORDER — FAMOTIDINE 20 MG PO TABS: 20.0000 mg | ORAL_TABLET | Freq: Two times a day (BID) | ORAL | 2 refills | Status: AC

## 2024-09-25 MED ORDER — SIMVASTATIN 80 MG PO TABS: 80.0000 mg | ORAL_TABLET | Freq: Every day | ORAL | 1 refills | Status: AC

## 2024-09-25 MED ORDER — REPATHA SURECLICK 140 MG/ML ~~LOC~~ SOAJ: 140.0000 mg | SUBCUTANEOUS | 2 refills | Status: AC

## 2024-09-25 MED ORDER — PIOGLITAZONE HCL 45 MG PO TABS: 45.0000 mg | ORAL_TABLET | Freq: Every day | ORAL | 1 refills | Status: AC

## 2024-09-25 MED ORDER — FREESTYLE LIBRE 3 SENSOR MISC: 2.0000 | 3 refills | Status: AC

## 2024-09-25 MED ORDER — EZETIMIBE 10 MG PO TABS: 10.0000 mg | ORAL_TABLET | Freq: Every day | ORAL | 1 refills | Status: AC

## 2024-09-25 MED ORDER — EMPAGLIFLOZIN 25 MG PO TABS: 25.0000 mg | ORAL_TABLET | Freq: Every day | ORAL | 1 refills | Status: AC

## 2024-09-25 NOTE — Assessment & Plan Note (Addendum)
 Episodes of diarrhea possibly related to dietary triggers, particularly tea, milk - Advised monitoring dietary intake to identify potential triggers.

## 2024-09-25 NOTE — Assessment & Plan Note (Signed)
 Lab Results  Component Value Date   CHOL 223 (H) 07/26/2024   HDL 47 07/26/2024   LDLCALC 152 (H) 07/26/2024   LDLDIRECT 160 (H) 03/12/2024   TRIG 134 07/26/2024   CHOLHDL 4.7 (H) 07/26/2024   LDL remains high at 152. Goal is LDL <70 due to diabetes.  Need to get LDL under control discussed Continue simvastatin  80 mg daily, Zetia  10 mg daily Advised to start Repatha  140 mg every 2 weeks injection as ordered Avoid fatty fried foods

## 2024-09-25 NOTE — Assessment & Plan Note (Signed)
  Pain in middle back possibly due to degenerative changes. Tylenol  provides some relief. - Ordered x-ray of lumbar spine to assess for arthritis or degenerative changes. UA negative for UTI

## 2024-09-25 NOTE — Patient Instructions (Signed)
 Goal for fasting blood sugar ranges from 80 to 120 and 2 hours after any meal or at bedtime should be between 130 to 170.    2. Chronic midline low back pain without sciatica  - DGLumbar Spine Complete; Future  Please get your x-ray done at [facility] Address: [address] [place], [place], [place] [address] Phone: [phone]     . Right upper quadrant abdominal pain  - US  ABDOMEN LIMITED RUQ (LIVER/GB); Future  . Dyspepsia  - famotidine  (PEPCID ) 20 MG tablet; Take 1 tablet (20 mg total) by mouth 2 (two) times daily.  Dispense: 60 tablet; Refill: 2 - CBC - CMP    It is important that you exercise regularly at least 30 minutes 5 times a week as tolerated  Think about what you will eat, plan ahead. Choose  clean, green, fresh or frozen over canned, processed or packaged foods which are more sugary, salty and fatty. 70 to 75% of food eaten should be vegetables and fruit. Three meals at set times with snacks allowed between meals, but they must be fruit or vegetables. Aim to eat over a 12 hour period , example 7 am to 7 pm, and STOP after  your last meal of the day. Drink water,generally about 64 ounces per day, no other drink is as healthy. Fruit juice is best enjoyed in a healthy way, by EATING the fruit.  Thanks for choosing [facility] we consider it a privelige to serve you.

## 2024-09-25 NOTE — Assessment & Plan Note (Signed)
  Routine health maintenance due. Upcoming eye appointment. Overdue for cervical cancer screening. - Encouraged keeping upcoming eye appointment. - Offered cervical cancer screening (Pap smear) today but she declined Has upcoming mammogram.

## 2024-09-25 NOTE — Progress Notes (Signed)
 Established Patient Office Visit  Subjective:  Patient ID: [name], female    DOB: 1965/11/25  Age: 58 y.o. MRN: [id number]  CC:  Chief Complaint  Patient presents with   Diabetes   Abdominal Pain    Pain has been going on for 2 weeks now, was given Azithromycin  and has completed the course, sharpe pains that varies in intensity, once she voids she feels better, does get constipated, diarrhea    possible uti    Pain in the lower back, dark urine, urine frequency   Medication Management    Would like the libre or the g7 sensor     HPI   Discussed the use of AI scribe software for clinical note transcription with the patient, who gave verbal consent to proceed.  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), Arthralgia (05/05/2014), Cervical strain (04/07/2015), Chronic headaches (01/06/2014), Dandruff (01/06/2014), Depressed, Diabetes mellitus (HCC) (01/17/2013), Diabetes mellitus without complication (HCC), Dysuria (12/19/2012), Eczema (04/04/2017), Fatigue (04/04/2017), Fatty liver, History DEPRESSION (03/10/2007), History of barium enema, History of pelvic ultrasound (09/2005), Hyperlipidemia, Hypertension, Lactose intolerance (08/13/2015), Left axillary pain (02/12/2013), Left genital labial abscess (06/05/2014), Lower abdominal pain (12/07/2012), MASTALGIA (03/10/2007), Pain, dental (12/08/2016), Right upper quadrant abdominal pain (01/17/2013), Shoulder pain, left (09/04/2012), Sinus congestion (03/12/2013), Sinusitis, chronic (09/30/2013), Somatic complaints, multiple, SYMPTOM, PAIN, ABDOMINAL, OTH SPECIFIED SITE (01/10/2005), and 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  who presents for follow-up of her medical conditions. She is accompanied by her husband.  Her blood pressure readings today were 148/67 and 145/70. She is not on antihypertensive medication and has not been monitoring her blood pressure at home.  Her diabetes management is discussed, noting her A1c is 7.6, up from 7.4 three months ago. She has stopped taking Jardiance  due to concerns about potential side effects, specifically cancer, which she heard from a friend's son. She has been taking Metformin  1000 mg twice daily and Actos  45 mg daily, although there is some discrepancy about her adherence to Metformin . She also takes Simvastatin  80 mg and Zetia  10 mg daily for cholesterol management. She has picked up Repatha  but has not started the medication  She describes experiencing significant stomach pain, particularly on the right side, with a burning sensation in the epigastric area that worsens after eating or drinking, a weird  taste in her mouth. This has been ongoing for two weeks and is accompanied by nausea and diarrhea, especially after consuming tea, milk. She reports difficulty with bowel movements and no blood in her stool. She had her gallbladder removed in 2009.  She was at urgent care on 09/11/2024 and was prescribed azithromycin  and Zofran  but her symptoms persist  She mentions experiencing back pain in the middle of her back, which is not consistently aggravated by any specific activity. She takes Tylenol  daily for pain relief.  No urinary tract infection symptoms, normal urination frequency and color.  Assessment & Plan      Past Medical History:  Diagnosis Date   Abdominal pain, epigastric 06/11/2015   Abdominal ultrasound, abnormal    normal   Abnormal abdominal CT scan 01/20/2005   and pelvic CT, normal    Allergic dermatitis 09/17/2015   ALLERGIC RHINITIS WITH CONJUNCTIVITIS 06/30/2009   Qualifier: Diagnosis of  By: Adella MD,  Elizabeth      Amenorrhea 03/29/2013   Anxiety with somatization 03/12/2013   Arthralgia 05/05/2014   Cervical strain 04/07/2015   Chronic headaches 01/06/2014   Dandruff 01/06/2014   Depressed    Diabetes mellitus (HCC) 01/17/2013   Diabetes mellitus without complication (HCC)    Dysuria 12/19/2012   Eczema 04/04/2017   Fatigue 04/04/2017   Fatty liver    History DEPRESSION 03/10/2007   Qualifier: Diagnosis of  By: Rufus Gower tech, Delon     History of barium enema    normal   History of pelvic ultrasound 09/2005   normal    Hyperlipidemia    Hypertension    Lactose intolerance 08/13/2015   Left axillary pain 02/12/2013   Left genital labial abscess 06/05/2014   Lower abdominal pain 12/07/2012   MASTALGIA 03/10/2007   Qualifier: Diagnosis of  By: Rufus Gower tech, Jennifer     Pain, dental 12/08/2016   Right upper quadrant abdominal pain 01/17/2013   Shoulder pain, left 09/04/2012   Sinus congestion 03/12/2013   Sinusitis, chronic 09/30/2013   Somatic complaints, multiple    SYMPTOM, PAIN, ABDOMINAL, OTH SPECIFIED SITE 01/10/2005   Qualifier: Diagnosis of  By: Rufus Gower tech, Jennifer     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 Waddell at [facility]. Last visit was 2012. Instructed that if pt develops palpitations, then catheter ablation would be recommended.       Past Surgical History:  Procedure Laterality Date   APPENDECTOMY     caesarean section     x2   CHOLECYSTECTOMY  2009    Family History  Problem Relation Age of Onset   Throat cancer Father        non-smoker   Diabetes Mother    Hypertension Mother     Social History   Socioeconomic History   Marital status: Married    Spouse name: Not on file   Number of children: 3   Years of education: Not on file   Highest education level: Not on file  Occupational History   Not on file  Tobacco Use   Smoking status: Never   Smokeless tobacco: Never  Substance and  Sexual Activity   Alcohol use: No    Alcohol/week: 0.0 standard drinks of alcohol   Drug use: No   Sexual activity: Not Currently  Other Topics Concern   Not on file  Social History Narrative   Originally from [place]. 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 Lives at home with husband and 3 children.    Social Drivers of Corporate Investment Banker Strain: Not on file  Food Insecurity: No Food Insecurity (06/11/2024)   Hunger Vital Sign    Worried About Running Out of Food in the Last Year: Never true    Ran Out of Food in the Last Year: Never true  Transportation Needs: No Transportation Needs (06/12/2024)   PRAPARE - Administrator, Civil Service (Medical): No    Lack of Transportation (Non-Medical): No  Physical Activity: Not on file  Stress: Not on file  Social Connections: Not on file  Intimate Partner Violence: Not on file    Outpatient Medications Prior to Visit  Medication Sig Dispense Refill   ezetimibe  (ZETIA ) 10 MG tablet Take 1 tablet (10 mg total) by mouth daily. 90 tablet 1   metFORMIN  (GLUCOPHAGE ) 1000 MG tablet Take 1 tablet (1,000 mg total) by mouth 2 (  two) times daily with a meal. 180 tablet 3   pioglitazone  (ACTOS ) 45 MG tablet Take 1 tablet (45 mg total) by mouth daily. 90 tablet 1   simvastatin  (ZOCOR ) 80 MG tablet Take 1 tablet (80 mg total) by mouth daily. 90 tablet 1   Cholecalciferol (VITAMIN D3) 25 MCG (1000 UT) CAPS Take 1 capsule (1,000 Units total) by mouth daily. (Patient not taking: Reported on 09/25/2024) 90 capsule 1   clobetasol  cream (TEMOVATE ) 0.05 % Apply 1 Application topically 2 (two) times daily. (Patient not taking: Reported on 09/25/2024) 30 g 1   diclofenac  (VOLTAREN ) 50 MG EC tablet Take 1 tablet (50 mg total) by mouth 2 (two) times daily as needed. (Patient not taking: Reported on 09/25/2024) 30 tablet 0   fluticasone  (FLONASE ) 50 MCG/ACT nasal spray Place 2 sprays into both nostrils daily.  (Patient not taking: Reported on 09/25/2024) 11.1 mL 3   loratadine  (CLARITIN ) 10 MG tablet Take 1 tablet (10 mg total) by mouth daily. (Patient not taking: Reported on 09/25/2024) 30 tablet 11   sodium chloride (OCEAN) 0.65 % SOLN nasal spray Place 1 spray into both nostrils as needed. (Patient not taking: Reported on 09/25/2024) 30 mL 0   empagliflozin  (JARDIANCE ) 25 MG TABS tablet Take 1 tablet (25 mg total) by mouth daily. (Patient not taking: Reported on 09/25/2024) 90 tablet 1   Evolocumab  (REPATHA  SURECLICK) 140 MG/ML SOAJ Inject 140 mg into the skin every 14 (fourteen) days. (Patient not taking: Reported on 09/25/2024) 2 mL 2   No facility-administered medications prior to visit.    Allergies  Allergen Reactions   Amoxicillin  Swelling    Other reaction(s): swollen/rash   Rosuvastatin Calcium Other (See Comments)    ROS Review of Systems  Constitutional:  Negative for appetite change, chills, fatigue and fever.  HENT:  Negative for congestion, postnasal drip, rhinorrhea and sneezing.   Respiratory:  Negative for cough, shortness of breath and wheezing.   Cardiovascular:  Negative for chest pain, palpitations and leg swelling.  Gastrointestinal:  Positive for abdominal pain and nausea. Negative for constipation and vomiting.  Genitourinary:  Negative for difficulty urinating, dysuria, flank pain and frequency.  Musculoskeletal:  Positive for back pain. Negative for joint swelling and myalgias.  Skin:  Negative for color change, pallor, rash and wound.  Neurological:  Negative for facial asymmetry, weakness, numbness and headaches.  Psychiatric/Behavioral:  Negative for behavioral problems, confusion, self-injury and suicidal ideas.       Objective:    Physical Exam Vitals and nursing note reviewed.  Constitutional:      General: She is not in acute distress.    Appearance: Normal appearance. She is not ill-appearing, toxic-appearing or diaphoretic.  Eyes:     General: No  scleral icterus.       Right eye: No discharge.        Left eye: No discharge.     Extraocular Movements: Extraocular movements intact.     Conjunctiva/sclera: Conjunctivae normal.  Cardiovascular:     Rate and Rhythm: Normal rate and regular rhythm.     Pulses: Normal pulses.     Heart sounds: Normal heart sounds. No murmur heard.    No friction rub. No gallop.  Pulmonary:     Effort: Pulmonary effort is normal. No respiratory distress.     Breath sounds: Normal breath sounds. No stridor. No wheezing, rhonchi or rales.  Chest:     Chest wall: No tenderness.  Abdominal:     General: There is  no distension.     Palpations: Abdomen is soft. There is no mass.     Tenderness: There is abdominal tenderness in the right upper quadrant and epigastric area. There is no right CVA tenderness, left CVA tenderness or guarding.  Musculoskeletal:        General: Tenderness present. No swelling, deformity or signs of injury.     Right lower leg: No edema.     Left lower leg: No edema.     Comments: Tenderness on palpation of mid back  Skin:    General: Skin is warm and dry.     Capillary Refill: Capillary refill takes less than 2 seconds.     Coloration: Skin is not jaundiced or pale.     Findings: No bruising, erythema or lesion.  Neurological:     Mental Status: She is alert and oriented to person, place, and time.     Motor: No weakness.     Gait: Gait normal.  Psychiatric:        Mood and Affect: Mood normal.        Behavior: Behavior normal.        Thought Content: Thought content normal.        Judgment: Judgment normal.     BP (!) 145/70 (BP Location: Left Arm, Patient Position: Sitting, Cuff Size: Normal)   Pulse 76   Wt 146 lb 4.8 oz (66.4 kg)   SpO2 100%   BMI 25.11 kg/m  Wt Readings from Last 3 Encounters:  09/25/24 146 lb 4.8 oz (66.4 kg)  08/23/24 141 lb (64 kg)  07/26/24 140 lb (63.5 kg)    Lab Results  Component Value Date   TSH 3.510 05/02/2017   Lab Results   Component Value Date   WBC 5.8 01/09/2024   HGB 11.8 01/09/2024   HCT 36.1 01/09/2024   MCV 88 01/09/2024   PLT 271 01/09/2024   Lab Results  Component Value Date   NA 142 07/26/2024   K 4.4 07/26/2024   CO2 22 07/26/2024   GLUCOSE 99 07/26/2024   BUN 15 07/26/2024   CREATININE 0.75 07/26/2024   BILITOT 0.3 12/18/2023   ALKPHOS 92 12/18/2023   AST 25 12/18/2023   ALT 31 12/18/2023   PROT 7.4 12/18/2023   ALBUMIN 4.1 12/18/2023   CALCIUM 9.1 07/26/2024   ANIONGAP 5 01/01/2019   EGFR 93 07/26/2024   Lab Results  Component Value Date   CHOL 223 (H) 07/26/2024   Lab Results  Component Value Date   HDL 47 07/26/2024   Lab Results  Component Value Date   LDLCALC 152 (H) 07/26/2024   Lab Results  Component Value Date   TRIG 134 07/26/2024   Lab Results  Component Value Date   CHOLHDL 4.7 (H) 07/26/2024   Lab Results  Component Value Date   HGBA1C 7.6 (A) 09/25/2024      Assessment & Plan:   Problem List Items Addressed This Visit       Cardiovascular and Mediastinum   High blood pressure   DASH diet and commitment to daily physical activity for a minimum of 30 minutes discussed and encouraged, as a part of hypertension management. The importance of attaining a healthy weight is also discussed.     09/25/2024   11:18 AM 09/25/2024   11:17 AM 08/23/2024    2:24 PM 08/23/2024    2:21 PM 07/26/2024   10:54 AM 06/12/2024    3:43 PM 06/12/2024    3:29 PM  BP/Weight  Systolic BP 145 148 137 141 128 118 143  Diastolic BP 70 67 73 69 63 47 54  Wt. (Lbs)  146.3  141 140  139.2  BMI  25.11 kg/m2  24.2 kg/m2 24.03 kg/m2  23.89 kg/m2  Has always had low diastolic blood pressure and slightly elevated systolic blood pressure was prescribed valsartan  in the past but notes taking medication due to dizziness.  Advised to monitor blood pressure at home goal is less than 130/80 Counseled on heart healthy low-salt low-fat diet Encouraged moderate exercises at least 150  minutes weekly as tolerated Will reevaluate blood pressure at next visit and restart valsartan  if needed        Relevant Medications   Evolocumab  (REPATHA  SURECLICK) 140 MG/ML SOAJ   ezetimibe  (ZETIA ) 10 MG tablet   simvastatin  (ZOCOR ) 80 MG tablet     Endocrine   Uncontrolled type 2 diabetes mellitus with hyperglycemia (HCC) - Primary   Lab Results  Component Value Date   HGBA1C 7.6 (A) 09/25/2024   Type 2 diabetes mellitus with hyperglycemia A1c increased to 7.6. Goal is A1c <7 to reduce complication risks. Inconsistent Jardiance  use due to unfounded side effect concerns. - Encouraged resumption of Jardiance  for cardiac and renal protection.  Common side effect of medication discussed - Continue Metformin  1000 mg twice daily. - Continue Actos  45 mg daily. - Advised dietary modifications to reduce carbohydrate intake, avoiding sugar, sweets, soda, bread, pasta, and rice. States that she has upcoming diabetic eye exam - Encouraged use of MyChart for medication concerns. She would like freestyle libre ordered, states that she will try to pay for it if her insurance does not cover it       Relevant Medications   empagliflozin  (JARDIANCE ) 25 MG TABS tablet   metFORMIN  (GLUCOPHAGE ) 1000 MG tablet   pioglitazone  (ACTOS ) 45 MG tablet   simvastatin  (ZOCOR ) 80 MG tablet   Continuous Glucose Sensor (FREESTYLE LIBRE 3 SENSOR) MISC   Other Relevant Orders   HgB A1c (Completed)   LDL Cholesterol, Direct     Other   Right upper quadrant abdominal pain   Relevant Orders   US  ABDOMEN LIMITED RUQ (LIVER/GB)   Lipase   Dyslipidemia, goal LDL below 70   Lab Results  Component Value Date   CHOL 223 (H) 07/26/2024   HDL 47 07/26/2024   LDLCALC 152 (H) 07/26/2024   LDLDIRECT 160 (H) 03/12/2024   TRIG 134 07/26/2024   CHOLHDL 4.7 (H) 07/26/2024   LDL remains high at 152. Goal is LDL <70 due to diabetes.  Need to get LDL under control discussed Continue simvastatin  80 mg daily, Zetia  10  mg daily Advised to start Repatha  140 mg every 2 weeks injection as ordered Avoid fatty fried foods       Relevant Medications   Evolocumab  (REPATHA  SURECLICK) 140 MG/ML SOAJ   ezetimibe  (ZETIA ) 10 MG tablet   simvastatin  (ZOCOR ) 80 MG tablet   Diarrhea   Episodes of diarrhea possibly related to dietary triggers, particularly tea, milk - Advised monitoring dietary intake to identify potential triggers.       Dyspepsia   with associated nausea and abdominal pain Symptoms suggestive of acid reflux, exacerbated by spicy or fatty foods. Pain on right side of abdomen and epigastric area, no gallbladder since 2009. NSAIDs may worsen pain. - Ordered abdominal ultrasound to rule out other causes of pain. - Prescribed famotidine  20 mg twice daily - Advised dietary modifications to avoid fatty, fried, and spicy  foods, and caffeinated drinks. - Recommended avoiding eating within 2-3 hours of bedtime. - Will consider gastroenterologist referral if symptoms persist after ultrasound and dietary changes. Last colonoscopy was in 2019 with recommendations to repeat after 5 years per patient, she has been referred to GI ,-contact information provided       Relevant Medications   famotidine  (PEPCID ) 20 MG tablet   Other Relevant Orders   CBC   CMP   Lipase   Chronic midline low back pain without sciatica    Pain in middle back possibly due to degenerative changes. Tylenol  provides some relief. - Ordered x-ray of lumbar spine to assess for arthritis or degenerative changes. UA negative for UTI       Relevant Orders   DG Lumbar Spine Complete   POCT URINE DIPSTICK (Completed)   Health care maintenance    Routine health maintenance due. Upcoming eye appointment. Overdue for cervical cancer screening. - Encouraged keeping upcoming eye appointment. - Offered cervical cancer screening (Pap smear) today but she declined Has upcoming mammogram.       Meds ordered this encounter   Medications   famotidine  (PEPCID ) 20 MG tablet    Sig: Take 1 tablet (20 mg total) by mouth 2 (two) times daily.    Dispense:  60 tablet    Refill:  2   empagliflozin  (JARDIANCE ) 25 MG TABS tablet    Sig: Take 1 tablet (25 mg total) by mouth daily.    Dispense:  90 tablet    Refill:  1   metFORMIN  (GLUCOPHAGE ) 1000 MG tablet    Sig: Take 1 tablet (1,000 mg total) by mouth 2 (two) times daily with a meal.    Dispense:  180 tablet    Refill:  3   Evolocumab  (REPATHA  SURECLICK) 140 MG/ML SOAJ    Sig: Inject 140 mg into the skin every 14 (fourteen) days.    Dispense:  2 mL    Refill:  2   ezetimibe  (ZETIA ) 10 MG tablet    Sig: Take 1 tablet (10 mg total) by mouth daily.    Dispense:  90 tablet    Refill:  1   pioglitazone  (ACTOS ) 45 MG tablet    Sig: Take 1 tablet (45 mg total) by mouth daily.    Dispense:  90 tablet    Refill:  1   simvastatin  (ZOCOR ) 80 MG tablet    Sig: Take 1 tablet (80 mg total) by mouth daily.    Dispense:  90 tablet    Refill:  1   Continuous Glucose Sensor (FREESTYLE LIBRE 3 SENSOR) MISC    Sig: 2 each by Does not apply route as directed. Place 1 sensor on the skin every 14 days. Use to check glucose continuously    Dispense:  2 each    Refill:  3    Follow-up: Return in about 4 weeks (around 10/23/2024) for abdominal pain.    Rexann Lueras R Shaena Parkerson, FNP

## 2024-09-25 NOTE — Assessment & Plan Note (Signed)
 with associated nausea and abdominal pain Symptoms suggestive of acid reflux, exacerbated by spicy or fatty foods. Pain on right side of abdomen and epigastric area, no gallbladder since 2009. NSAIDs may worsen pain. - Ordered abdominal ultrasound to rule out other causes of pain. - Prescribed famotidine  20 mg twice daily - Advised dietary modifications to avoid fatty, fried, and spicy foods, and caffeinated drinks. - Recommended avoiding eating within 2-3 hours of bedtime. - Will consider gastroenterologist referral if symptoms persist after ultrasound and dietary changes. Last colonoscopy was in 2019 with recommendations to repeat after 5 years per patient, she has been referred to GI ,-contact information provided

## 2024-09-25 NOTE — Assessment & Plan Note (Addendum)
 Lab Results  Component Value Date   HGBA1C 7.6 (A) 09/25/2024   Type 2 diabetes mellitus with hyperglycemia A1c increased to 7.6. Goal is A1c <7 to reduce complication risks. Inconsistent Jardiance  use due to unfounded side effect concerns. - Encouraged resumption of Jardiance  for cardiac and renal protection.  Common side effect of medication discussed - Continue Metformin  1000 mg twice daily. - Continue Actos  45 mg daily. - Advised dietary modifications to reduce carbohydrate intake, avoiding sugar, sweets, soda, bread, pasta, and rice. States that she has upcoming diabetic eye exam - Encouraged use of MyChart for medication concerns. She would like freestyle libre ordered, states that she will try to pay for it if her insurance does not cover it

## 2024-09-25 NOTE — Assessment & Plan Note (Addendum)
 DASH diet and commitment to daily physical activity for a minimum of 30 minutes discussed and encouraged, as a part of hypertension management. The importance of attaining a healthy weight is also discussed.     09/25/2024   11:18 AM 09/25/2024   11:17 AM 08/23/2024    2:24 PM 08/23/2024    2:21 PM 07/26/2024   10:54 AM 06/12/2024    3:43 PM 06/12/2024    3:29 PM  BP/Weight  Systolic BP 145 148 137 141 128 118 143  Diastolic BP 70 67 73 69 63 47 54  Wt. (Lbs)  146.3  141 140  139.2  BMI  25.11 kg/m2  24.2 kg/m2 24.03 kg/m2  23.89 kg/m2  Has always had low diastolic blood pressure and slightly elevated systolic blood pressure was prescribed valsartan  in the past but notes taking medication due to dizziness.  Advised to monitor blood pressure at home goal is less than 130/80 Counseled on heart healthy low-salt low-fat diet Encouraged moderate exercises at least 150 minutes weekly as tolerated Will reevaluate blood pressure at next visit and restart valsartan  if needed

## 2024-09-26 LAB — COMPREHENSIVE METABOLIC PANEL WITH GFR
ALT: 17 IU/L (ref 0–32)
AST: 18 IU/L (ref 0–40)
Albumin: 4.4 g/dL (ref 3.8–4.9)
Alkaline Phosphatase: 79 IU/L (ref 49–135)
BUN/Creatinine Ratio: 20 (ref 9–23)
BUN: 16 mg/dL (ref 6–24)
Bilirubin Total: <0.2 mg/dL (ref 0.0–1.2)
CO2: 23 mmol/L (ref 20–29)
Calcium: 10.1 mg/dL (ref 8.7–10.2)
Chloride: 103 mmol/L (ref 96–106)
Creatinine, Ser: 0.8 mg/dL (ref 0.57–1.00)
Globulin, Total: 3.5 g/dL (ref 1.5–4.5)
Glucose: 141 mg/dL — ABNORMAL HIGH (ref 70–99)
Potassium: 4.4 mmol/L (ref 3.5–5.2)
Sodium: 141 mmol/L (ref 134–144)
Total Protein: 7.9 g/dL (ref 6.0–8.5)
eGFR: 86 mL/min/1.73 (ref >59)

## 2024-09-26 LAB — CBC
Hematocrit: 35.9 % (ref 34.0–46.6)
Hemoglobin: 11.9 g/dL (ref 11.1–15.9)
MCH: 29.9 pg (ref 26.6–33.0)
MCHC: 33.1 g/dL (ref 31.5–35.7)
MCV: 90 fL (ref 79–97)
Platelets: 238 x10E3/uL (ref 150–450)
RBC: 3.98 x10E6/uL (ref 3.77–5.28)
RDW: 13.2 % (ref 11.7–15.4)
WBC: 5.3 x10E3/uL (ref 3.4–10.8)

## 2024-09-26 LAB — LIPASE: Lipase: 71 U/L (ref 14–72)

## 2024-09-26 LAB — LDL CHOLESTEROL, DIRECT: LDL Direct: 135 mg/dL — ABNORMAL HIGH (ref 0–99)

## 2024-09-27 ENCOUNTER — Ambulatory Visit: Payer: Self-pay | Admitting: Nurse Practitioner

## 2024-09-27 ENCOUNTER — Telehealth: Payer: Self-pay

## 2024-09-27 ENCOUNTER — Other Ambulatory Visit: Payer: Self-pay

## 2024-09-27 NOTE — Telephone Encounter (Signed)
 Pharmacy Patient Advocate Encounter  Received notification from [facility] MEDICAID that Prior Authorization for FREESTYLE LIBRE 3 SENSOR has been APPROVED from 09/27/2024 to 03/27/2025   PA #/Case ID/Reference #: [id number]

## 2024-10-01 ENCOUNTER — Inpatient Hospital Stay: Admission: RE | Admit: 2024-10-01 | Discharge: 2024-10-01 | Attending: Nurse Practitioner

## 2024-10-01 DIAGNOSIS — Z1231 Encounter for screening mammogram for malignant neoplasm of breast: Secondary | ICD-10-CM

## 2024-10-02 ENCOUNTER — Ambulatory Visit

## 2024-10-02 DIAGNOSIS — M25512 Pain in left shoulder: Secondary | ICD-10-CM | POA: Diagnosis present

## 2024-10-02 DIAGNOSIS — M542 Cervicalgia: Secondary | ICD-10-CM | POA: Insufficient documentation

## 2024-10-02 NOTE — Therapy (Addendum)
 " OUTPATIENT PHYSICAL THERAPY TREATMENT NOTE  PHYSICAL THERAPY DISCHARGE SUMMARY  Patient is being discharged from physical therapy as she has not returned since her last visit.   Marijo Berber PT, DPT 11/01/2024 11:11 AM   Patient Name: [name] MRN: [id number] DOB:[birth year], 58 y.o., female Today's Date: 10/02/2024  END OF SESSION:  PT End of Session - 10/02/24 1526     Visit Number 3    Number of Visits 8    Date for Recertification  10/15/24    Authorization Type [employer] NEXT/ [employer] MCD    PT Start Time 1530    PT Stop Time 1610    PT Time Calculation (min) 40 min    Activity Tolerance Patient tolerated treatment well    Behavior During Therapy [facility] for tasks assessed/performed          Past Medical History:  Diagnosis Date   Abdominal pain, epigastric 06/11/2015   Abdominal ultrasound, abnormal    normal   Abnormal abdominal CT scan 01/20/2005   and pelvic CT, normal    Allergic dermatitis 09/17/2015   ALLERGIC RHINITIS WITH CONJUNCTIVITIS 06/30/2009   Qualifier: Diagnosis of  By: Adella MD, Elizabeth     Amenorrhea 03/29/2013   Anxiety with somatization 03/12/2013   Arthralgia 05/05/2014   Cervical strain 04/07/2015   Chronic headaches 01/06/2014   Dandruff 01/06/2014   Depressed    Diabetes mellitus (HCC) 01/17/2013   Diabetes mellitus without complication (HCC)    Dysuria 12/19/2012   Eczema 04/04/2017   Fatigue 04/04/2017   Fatty liver    History DEPRESSION 03/10/2007   Qualifier: Diagnosis of  By: Rufus Gower tech, Delon     History of barium enema    normal   History of pelvic ultrasound 09/2005   normal    Hyperlipidemia    Hypertension    Lactose intolerance 08/13/2015   Left axillary pain 02/12/2013   Left genital labial abscess 06/05/2014   Lower abdominal pain 12/07/2012   MASTALGIA 03/10/2007   Qualifier: Diagnosis of  By: Rufus Gower tech, Jennifer     Pain, dental 12/08/2016   Right upper quadrant abdominal  pain 01/17/2013   Shoulder pain, left 09/04/2012   Sinus congestion 03/12/2013   Sinusitis, chronic 09/30/2013   Somatic complaints, multiple    SYMPTOM, PAIN, ABDOMINAL, OTH SPECIFIED SITE 01/10/2005   Qualifier: Diagnosis of  By: Rufus Gower tech, Jennifer     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 Waddell at [facility]. Last visit was 2012. Instructed that if pt develops palpitations, then catheter ablation would be recommended.      Past Surgical History:  Procedure Laterality Date   APPENDECTOMY     caesarean section     x2   CHOLECYSTECTOMY  2009   Patient Active Problem List   Diagnosis Date Noted   Dyspepsia 09/25/2024   Chronic midline low back pain without sciatica 09/25/2024   Health care maintenance 09/25/2024   Vitamin D  deficiency 07/26/2024   Diarrhea 07/26/2024   Neck pain 06/12/2024   Acute cough 01/09/2024   Uncontrolled type 2 diabetes mellitus with hyperglycemia (HCC) 12/12/2023   High blood pressure 12/12/2023   Left ear pain 12/12/2023   Hearing trouble, left 12/12/2023   Right hip pain 12/12/2023   Dyslipidemia, goal LDL below 70 12/12/2023   Eczema 04/04/2017   Fatigue 04/04/2017   Allergic dermatitis 09/17/2015   Lactose intolerance 08/13/2015   Cervical strain 04/07/2015   Arthralgia 05/05/2014  Chronic headaches 01/06/2014   Amenorrhea 03/29/2013   Right upper quadrant abdominal pain 01/17/2013   Diabetes mellitus (HCC) 01/17/2013   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   Allergic rhinitis 06/30/2009   Mastalgia 03/10/2007    PCP: Paseda, Folashade R, FNP  REFERRING PROVIDER: Palmer Purchase, PA-C  REFERRING DIAG: M54.2 (ICD-10-CM) - Neck pain   THERAPY DIAG:  Neck pain  Acute pain of left shoulder  Rationale for Evaluation and Treatment: Rehabilitation  ONSET DATE: few years ago   SUBJECTIVE:                                                                                                                                                                                                          SUBJECTIVE STATEMENT: Patient states that she is having a little bit of pain in her neck today, she states that her arm is sore from placing new Dexcom.   EVAL:Pt reports neck pain that has been on and off for a few years with this last episode starting 2 months ago. She reports the left side being worse and even causing L ear pain. She saw an ENT who determined the issue is likely muscular in nature. Her pain has not changed since onset and bothers her when lifting objects, opening bottles, and reaching overhead. She notes sleeping is troublesome. The pt denies trauma and n/t.   Hand dominance: Right  PERTINENT HISTORY:  Chronic neck pain HTN Uncontrolled DM II  PAIN:  Are you having pain? Yes: NPRS scale: 3/10 Pain location: B upper traps radiating up and down (not below the shoulder)  Pain description: sharp Aggravating factors: lifting, opening bottles Relieving factors: walking  PRECAUTIONS: None  RED FLAGS: Cervical red flags: Dysphagia No, Dysmetria No, Diplopia No, Nystagmus No, and Nausea No     WEIGHT BEARING RESTRICTIONS: No  FALLS:  Has patient fallen in last 6 months? No  LIVING ENVIRONMENT: Lives with: lives with their family Lives in: House/apartment Has following equipment at home: None  OCCUPATION: teacher   PLOF: Independent  PATIENT GOALS: have less pain  NEXT MD VISIT: none scheduled   OBJECTIVE:  Note: Objective measures were completed at Evaluation unless otherwise noted.  DIAGNOSTIC FINDINGS:  CT scan of neck in 2023 - Skeleton: There is mild degenerative change C6-C7. There is no acute osseous abnormality or suspicious osseous lesion.  PATIENT SURVEYS:  NDI: will collect next session  NECK DISABILITY INDEX  Date: 09/24/2024 Score  Pain intensity 2  2. Personal care (washing, dressing, etc.) 0  3. Lifting 1  4.  Reading 2  5. Headaches 1  6. Concentration 0  7. Work 1  8. Driving 5  9. Sleeping 3  10. Recreation 1  Total 16/50   Minimum Detectable Change (90% confidence): 5 points or 10% points  COGNITION: Overall cognitive status: Within functional limits for tasks assessed  SENSATION: WFL  POSTURE: rounded shoulders, forward head, and increased thoracic kyphosis  PALPATION: Tenderness over B upper traps, cervical spinal processes, proximal attachment of SCM on L    CERVICAL ROM:   Active ROM AROM eval  Flexion 50% limited*   Extension 50% limited*  Right lateral flexion 25% limited*   Left lateral flexion 25% limited*   Right rotation 25% limited*   Left rotation 25% limited*    (Blank rows = not tested)(* = pain)   UPPER EXTREMITY ROM:  Active ROM Right eval Left eval  Shoulder flexion    Shoulder extension WNL WNL  Shoulder abduction 160* 160*  Shoulder adduction    Shoulder internal rotation    Shoulder external rotation WNL WNL  Elbow flexion    Elbow extension    Wrist flexion    Wrist extension    Wrist ulnar deviation    Wrist radial deviation    Wrist pronation    Wrist supination     (Blank rows = not tested)(* = pain)   Cervical MMT:  MMT Eval  Flexion  5/5  Extension  5/5  L rotation  5/5*  R rotation  5/5*   (Blank rows = not tested)   CERVICAL SPECIAL TESTS:  Spurling's test: Positive  TREATMENT DATE:  [facility] Adult PT Treatment:                                                DATE: 10/02/24 Therapeutic Exercise: UBE fwd/bwd 2 min ea Supine shoulder flexion with dowel x10 Supine chest press with dowel 2x10 Seated horizontal abduction 2x10 RTB Seated ER 2x10 RTB UT stretch BIL 30 Seated ITYs 2x10 ea  (Y painful)   Therapeutic Activity: Standing rows GTB 2x10 Standing shoulder extension GTB 2x10 Lifting 1# into cabinet bottom/middle shelf BIL 2x10  [facility] Adult PT Treatment:                                                DATE:  09/24/24 Therapeutic Exercise: Supine shoulder flexion with dowel 2x10 Supine shoulder abduction with dowel 2x10 Supine chest press with dowel Seated horizontal abduction 2x10 RTB Seated ER 2x10 RTB UT stretch BIL 30 SNAGs Seated ITYs x10 ea  Therapeutic Activity: Standing rows GTB 2x10 Standing shoulder extension GTB 2x10   09/17/2024 Therapeutic Exercise  SNAGs Towel assisted cervical extension  First rib mob with towel B   PATIENT EDUCATION:  Education details: diagnosis, prognosis, HEP, POC. Person educated: Patient and Spouse Education method: Explanation, Demonstration, Tactile cues, Verbal cues, and Handouts Education comprehension: verbalized understanding, returned demonstration, and verbal cues required  HOME EXERCISE PROGRAM: Access Code: [email or web address] URL: [email or web address] Date: 09/17/2024 Prepared by: Marijo Berber  Exercises - Seated Assisted Cervical Rotation with Towel  - 2-3 x daily - 7 x weekly - 1 sets - 5 reps - 10s hold - Cervical Extension AROM with Strap  - 2-3 x daily - 7 x weekly - 1 sets - 5 reps - First Rib Mobilization with Strap  - 2-3 x daily - 7 x weekly - 1 sets - 5 reps - 10s hold - Supine Shoulder Flexion with Dowel  - 2-3 x daily - 7 x weekly - 1 sets - 10 reps - 5s hold   ASSESSMENT:  CLINICAL IMPRESSION: Patient presents to PT reporting that she is having some soreness in her shoulders today. Patient was able to tolerate exercises well, had discontinued Ys during ITYs due to increased shoulder pain. Also only did one set of shoulder flexion due to patients worry about new dexcom placement. Patient will benefit from skilled PT in order to increase functional mobility.  EVAL:Patient is a 58 y.o. female who was seen today for physical therapy evaluation and treatment for neck pain. Pt is limited in her ability to lift,  reach, and perform ADLs. She has poor cervical ROM due to pain, tenderness and hypomobility due to pain, but full cervical strength. After observation, she has poor scapulohumeral rhythm B. The pt will benefit from skilled physical therapy to return decrease pain and increase function.     OBJECTIVE IMPAIRMENTS: decreased mobility, decreased ROM, decreased strength, hypomobility, impaired flexibility, and pain.   ACTIVITY LIMITATIONS: lifting, sleeping, and reach over head  PARTICIPATION LIMITATIONS: meal prep, cleaning, and occupation  PERSONAL FACTORS: Past/current experiences, Social background, and 1-2 comorbidities: DM II, HTN are also affecting patient's functional outcome.   REHAB POTENTIAL: Good  CLINICAL DECISION MAKING: Evolving/moderate complexity  EVALUATION COMPLEXITY: Moderate   GOALS: Goals reviewed with patient? Yes  SHORT TERM GOALS: Target date: 10/01/2024  Pt will be compliant and independent with HEP to assist with symptom management/recovery at home.  Baseline: [email or web address] Goal status: INITIAL  2.  Pt will demonstrate full B shoulder ABD AROM to assist with reaching tasks.  Baseline: 160 B  Goal status: INITIAL   LONG TERM GOALS: Target date: 10/15/2024  Pt will report 50% or greater improvement since the start of PT.  Baseline: 0% Goal status: INITIAL  2.  Pt will demonstrate 25% improvement in cervical AROM to assist with sleeping and ADLs.  Baseline: see objective  Goal status: INITIAL  3.  Pt will be able to lift her purse without pain to assist with QOL and ADLs.  Baseline: pain  Goal status: INITIAL   PLAN:  PT FREQUENCY: 1-2x/week  PT DURATION: 4 weeks  PLANNED INTERVENTIONS: 97110-Therapeutic exercises, 97530- Therapeutic activity, 97112- Neuromuscular re-education, 97535- Self Care, 02859- Manual therapy, 20560 (1-2 muscles), 20561 (3+ muscles)- Dry Needling, Patient/Family education, Joint mobilization, Joint manipulation, Spinal  manipulation, Spinal mobilization, Cryotherapy, and Moist heat  For all possible CPT codes, reference the Planned Interventions line above.     Check all conditions that are expected to impact treatment: {Conditions expected to impact treatment:Diabetes mellitus, Musculoskeletal disorders, and Psychological or psychiatric disorders   If  treatment provided at initial evaluation, no treatment charged due to lack of authorization.       PLAN FOR NEXT SESSION: collect NDI, introduce isometrics of CS, shoulder mobility, review HEP    Shanda Code, SPTA 10/02/2024, 3:27 PM    "

## 2024-10-03 ENCOUNTER — Ambulatory Visit

## 2024-10-07 ENCOUNTER — Ambulatory Visit: Payer: Self-pay | Admitting: Nurse Practitioner

## 2024-10-09 ENCOUNTER — Inpatient Hospital Stay: Admission: RE | Admit: 2024-10-09 | Discharge: 2024-10-09 | Attending: Nurse Practitioner

## 2024-10-09 DIAGNOSIS — R1011 Right upper quadrant pain: Secondary | ICD-10-CM

## 2024-10-09 DIAGNOSIS — K30 Functional dyspepsia: Secondary | ICD-10-CM | POA: Diagnosis not present

## 2024-10-15 ENCOUNTER — Ambulatory Visit

## 2024-10-15 ENCOUNTER — Telehealth: Payer: Self-pay

## 2024-10-15 NOTE — Telephone Encounter (Signed)
 Pt forgot about appt, r/s over phone.

## 2024-10-23 ENCOUNTER — Ambulatory Visit (INDEPENDENT_AMBULATORY_CARE_PROVIDER_SITE_OTHER): Payer: Self-pay | Admitting: Nurse Practitioner

## 2024-10-23 ENCOUNTER — Encounter: Payer: Self-pay | Admitting: Nurse Practitioner

## 2024-10-23 VITALS — BP 133/66 | HR 65 | Temp 97.0°F | Wt 146.2 lb

## 2024-10-23 DIAGNOSIS — E1165 Type 2 diabetes mellitus with hyperglycemia: Secondary | ICD-10-CM

## 2024-10-23 DIAGNOSIS — K760 Fatty (change of) liver, not elsewhere classified: Secondary | ICD-10-CM | POA: Insufficient documentation

## 2024-10-23 DIAGNOSIS — E785 Hyperlipidemia, unspecified: Secondary | ICD-10-CM | POA: Diagnosis not present

## 2024-10-23 DIAGNOSIS — K5900 Constipation, unspecified: Secondary | ICD-10-CM | POA: Insufficient documentation

## 2024-10-23 DIAGNOSIS — R1011 Right upper quadrant pain: Secondary | ICD-10-CM | POA: Diagnosis not present

## 2024-10-23 MED ORDER — POLYETHYLENE GLYCOL 3350 17 GM/SCOOP PO POWD: 17.0000 g | Freq: Every day | ORAL | 1 refills | Status: AC | PRN

## 2024-10-23 NOTE — Assessment & Plan Note (Signed)
 Noted on recent imaging Advised heart healthy low fat diet, low carb diet Importance of getting DM and hyperlipidemia under control discussed  Advised moderate exercises at least 150 minutes weekly as tolerated

## 2024-10-23 NOTE — Patient Instructions (Addendum)
" °  [facility]       [phone] option 1   Goal for fasting blood sugar ranges from 80 to 120 and 2 hours after any meal or at bedtime should be between 130 to 170.    It is important that you exercise regularly at least 30 minutes 5 times a week as tolerated  Think about what you will eat, plan ahead. Choose  clean, green, fresh or frozen over canned, processed or packaged foods which are more sugary, salty and fatty. 70 to 75% of food eaten should be vegetables and fruit. Three meals at set times with snacks allowed between meals, but they must be fruit or vegetables. Aim to eat over a 12 hour period , example 7 am to 7 pm, and STOP after  your last meal of the day. Drink water,generally about 64 ounces per day, no other drink is as healthy. Fruit juice is best enjoyed in a healthy way, by EATING the fruit.  Thanks for choosing [facility] we consider it a privelige to serve you.  "

## 2024-10-23 NOTE — Assessment & Plan Note (Signed)
" °  Intermittent constipation with discomfort. - Recommended Miralax  as needed. - Encouraged increased intake of vegetables and hydration.   "

## 2024-10-23 NOTE — Progress Notes (Signed)
 "  Established Patient Office Visit  Subjective:  Patient ID: [name], female    DOB: 10-15-1966  Age: 58 y.o. MRN: [id number]  CC:  Chief Complaint  Patient presents with   Abdominal Pain    4 week f/u.     HPI   Discussed the use of AI scribe software for clinical note transcription with the patient, who gave verbal consent to proceed.  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), Arthralgia (05/05/2014), Cervical strain (04/07/2015), Chronic headaches (01/06/2014), Dandruff (01/06/2014), Depressed, Diabetes mellitus (HCC) (01/17/2013), Diabetes mellitus without complication (HCC), Dysuria (12/19/2012), Eczema (04/04/2017), Fatigue (04/04/2017), Fatty liver, History DEPRESSION (03/10/2007), History of barium enema, History of pelvic ultrasound (09/2005), Hyperlipidemia, Hypertension, Lactose intolerance (08/13/2015), Left axillary pain (02/12/2013), Left genital labial abscess (06/05/2014), Lower abdominal pain (12/07/2012), MASTALGIA (03/10/2007), Pain, dental (12/08/2016), Right upper quadrant abdominal pain (01/17/2013), Shoulder pain, left (09/04/2012), Sinus congestion (03/12/2013), Sinusitis, chronic (09/30/2013), Somatic complaints, multiple, SYMPTOM, PAIN, ABDOMINAL, OTH SPECIFIED SITE (01/10/2005), and 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 who presents with abdominal pain.  She experiences ongoing abdominal pain, primarily on the right side, described as a 'burning' sensation that sometimes radiates to the upper abdomen. The patient reports that her ultrasound showed fatty liver, and she recalls being told there were no gallstones, as her  gallbladder has been removed. She is concerned about the persistence of the pain.  She has a history of diabetes and is currently taking Jardiance  25 mg daily, Actos  45 mg daily, and Metformin  1000 mg twice daily. Her last recorded A1c was 7.6, slightly higher than her previous 7.4. She is mindful of her diet, avoiding high sugar foods, and has noted an increase in her blood sugar levels after starting Repatha , reaching 200, which was not previously observed.  Her blood pressure has been fluctuating, sometimes too low and sometimes too high, with a recent reading of 141. She is on cholesterol medications, including Simvastatin  80 mg, Zetia  10 mg, and Repatha  injections.  She experiences constipation and has not been taking any medication for it. When she is able to pass stool comfortably, she feels better. She consumes a diet rich in vegetables and fruits, although she is cautious about the sugar content in fruits like oranges and bananas.     Assessment & Plan       Past Medical History:  Diagnosis Date   Abdominal pain, epigastric 06/11/2015   Abdominal ultrasound, abnormal    normal   Abnormal abdominal CT scan 01/20/2005   and pelvic CT, normal    Allergic dermatitis 09/17/2015   ALLERGIC RHINITIS WITH CONJUNCTIVITIS 06/30/2009   Qualifier: Diagnosis of  By: Adella MD, Elizabeth     Amenorrhea 03/29/2013   Anxiety with somatization 03/12/2013   Arthralgia 05/05/2014   Cervical strain 04/07/2015   Chronic headaches 01/06/2014   Dandruff 01/06/2014   Depressed    Diabetes mellitus (HCC) 01/17/2013   Diabetes mellitus without complication (HCC)    Dysuria 12/19/2012   Eczema 04/04/2017   Fatigue 04/04/2017   Fatty liver    History DEPRESSION 03/10/2007   Qualifier: Diagnosis of  By: Rufus Gower tech, Delon     History of barium enema    normal   History of pelvic ultrasound 09/2005   normal  Hyperlipidemia    Hypertension    Lactose intolerance 08/13/2015    Left axillary pain 02/12/2013   Left genital labial abscess 06/05/2014   Lower abdominal pain 12/07/2012   MASTALGIA 03/10/2007   Qualifier: Diagnosis of  By: Rufus Gower tech, Jennifer     Pain, dental 12/08/2016   Right upper quadrant abdominal pain 01/17/2013   Shoulder pain, left 09/04/2012   Sinus congestion 03/12/2013   Sinusitis, chronic 09/30/2013   Somatic complaints, multiple    SYMPTOM, PAIN, ABDOMINAL, OTH SPECIFIED SITE 01/10/2005   Qualifier: Diagnosis of  By: Rufus Gower tech, Delon BURKES (WOLFE)-PARKINSON-WHITE (WPW) SYNDROME 11/04/2010   Follows with Dr. Waddell at [facility]. Last visit was 2012. Instructed that if pt develops palpitations, then catheter ablation would be recommended.       Past Surgical History:  Procedure Laterality Date   APPENDECTOMY     caesarean section     x2   CHOLECYSTECTOMY  2009    Family History  Problem Relation Age of Onset   Throat cancer Father        non-smoker   Diabetes Mother    Hypertension Mother     Social History   Socioeconomic History   Marital status: Married    Spouse name: Not on file   Number of children: 3   Years of education: Not on file   Highest education level: Not on file  Occupational History   Not on file  Tobacco Use   Smoking status: Never   Smokeless tobacco: Never  Substance and Sexual Activity   Alcohol use: No    Alcohol/week: 0.0 standard drinks of alcohol   Drug use: No   Sexual activity: Not Currently  Other Topics Concern   Not on file  Social History Narrative   Originally from [place]. 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 Lives at home with husband and 3 children.    Social Drivers of Health   Tobacco Use: Low Risk (10/23/2024)   Patient History    Smoking Tobacco Use: Never    Smokeless Tobacco Use: Never    Passive Exposure: Not on file  Financial Resource Strain: Not on file  Food Insecurity: No Food Insecurity  (10/23/2024)   Epic    Worried About Programme Researcher, Broadcasting/film/video in the Last Year: Never true    Ran Out of Food in the Last Year: Never true  Transportation Needs: No Transportation Needs (10/23/2024)   Epic    Lack of Transportation (Medical): No    Lack of Transportation (Non-Medical): No  Physical Activity: Not on file  Stress: Not on file  Social Connections: Not on file  Intimate Partner Violence: Not At Risk (10/23/2024)   Epic    Fear of Current or Ex-Partner: No    Emotionally Abused: No    Physically Abused: No    Sexually Abused: No  Depression (PHQ2-9): Low Risk (10/23/2024)   Depression (PHQ2-9)    PHQ-2 Score: 0  Alcohol Screen: Not on file  Housing: Low Risk (10/23/2024)   Epic    Unable to Pay for Housing in the Last Year: No    Number of Times Moved in the Last Year: 0    Homeless in the Last Year: No  Utilities: Not At Risk (10/23/2024)   Epic    Threatened with loss of utilities: No  Health Literacy: Not on file    Outpatient Medications Prior to Visit  Medication Sig Dispense Refill   diclofenac  (VOLTAREN ) 50 MG EC tablet Take 1 tablet (50 mg total) by mouth 2 (two) times daily as needed. 30 tablet 0   empagliflozin  (JARDIANCE ) 25 MG TABS tablet Take 1 tablet (25 mg total) by mouth daily. 90 tablet 1   Evolocumab  (REPATHA  SURECLICK) 140 MG/ML SOAJ Inject 140 mg into the skin every 14 (fourteen) days. 2 mL 2   ezetimibe  (ZETIA ) 10 MG tablet Take 1 tablet (10 mg total) by mouth daily. 90 tablet 1   famotidine  (PEPCID ) 20 MG tablet Take 1 tablet (20 mg total) by mouth 2 (two) times daily. 60 tablet 2   metFORMIN  (GLUCOPHAGE ) 1000 MG tablet Take 1 tablet (1,000 mg total) by mouth 2 (two) times daily with a meal. 180 tablet 3   pioglitazone  (ACTOS ) 45 MG tablet Take 1 tablet (45 mg total) by mouth daily. 90 tablet 1   simvastatin  (ZOCOR ) 80 MG tablet Take 1 tablet (80 mg total) by mouth daily. 90 tablet 1   sodium chloride (OCEAN) 0.65 % SOLN nasal spray Place 1 spray  into both nostrils as needed. 30 mL 0   Cholecalciferol (VITAMIN D3) 25 MCG (1000 UT) CAPS Take 1 capsule (1,000 Units total) by mouth daily. (Patient not taking: Reported on 10/23/2024) 90 capsule 1   clobetasol  cream (TEMOVATE ) 0.05 % Apply 1 Application topically 2 (two) times daily. (Patient not taking: Reported on 10/23/2024) 30 g 1   Continuous Glucose Sensor (FREESTYLE LIBRE 3 SENSOR) MISC 2 each by Does not apply route as directed. Place 1 sensor on the skin every 14 days. Use to check glucose continuously (Patient not taking: Reported on 10/23/2024) 2 each 3   fluticasone  (FLONASE ) 50 MCG/ACT nasal spray Place 2 sprays into both nostrils daily. (Patient not taking: Reported on 10/23/2024) 11.1 mL 3   loratadine  (CLARITIN ) 10 MG tablet Take 1 tablet (10 mg total) by mouth daily. (Patient not taking: Reported on 10/23/2024) 30 tablet 11   No facility-administered medications prior to visit.    Allergies[1]  ROS Review of Systems  Constitutional:  Negative for appetite change, chills, fatigue and fever.  HENT:  Negative for congestion, postnasal drip, rhinorrhea and sneezing.   Respiratory:  Negative for cough, shortness of breath and wheezing.   Cardiovascular:  Negative for chest pain, palpitations and leg swelling.  Gastrointestinal:  Positive for abdominal pain and constipation. Negative for nausea and vomiting.  Genitourinary:  Negative for difficulty urinating, dysuria, flank pain and frequency.  Skin:  Negative for color change, pallor, rash and wound.  Neurological:  Negative for dizziness, facial asymmetry, weakness, numbness and headaches.  Psychiatric/Behavioral:  Negative for behavioral problems, confusion, self-injury and suicidal ideas.       Objective:    Physical Exam Vitals and nursing note reviewed.  Constitutional:      General: She is not in acute distress.    Appearance: Normal appearance. She is not ill-appearing, toxic-appearing or diaphoretic.  HENT:      Mouth/Throat:     Mouth: Mucous membranes are moist.     Pharynx: Oropharynx is clear. No oropharyngeal exudate or posterior oropharyngeal erythema.  Eyes:     General: No scleral icterus.       Right eye: No discharge.        Left eye: No discharge.     Extraocular Movements: Extraocular movements intact.     Conjunctiva/sclera: Conjunctivae normal.  Cardiovascular:     Rate and Rhythm: Normal rate and regular rhythm.  Pulses: Normal pulses.     Heart sounds: Normal heart sounds. No murmur heard.    No friction rub. No gallop.  Pulmonary:     Effort: Pulmonary effort is normal. No respiratory distress.     Breath sounds: Normal breath sounds. No stridor. No wheezing, rhonchi or rales.  Chest:     Chest wall: No tenderness.  Abdominal:     General: There is no distension.     Palpations: Abdomen is soft.     Tenderness: There is abdominal tenderness in the right upper quadrant. There is no right CVA tenderness, left CVA tenderness or guarding.  Musculoskeletal:        General: No swelling, tenderness, deformity or signs of injury.     Right lower leg: No edema.     Left lower leg: No edema.  Skin:    General: Skin is warm and dry.     Capillary Refill: Capillary refill takes less than 2 seconds.     Coloration: Skin is not jaundiced or pale.     Findings: No bruising, erythema or lesion.  Neurological:     Mental Status: She is alert and oriented to person, place, and time.     Motor: No weakness.     Coordination: Coordination normal.     Gait: Gait normal.  Psychiatric:        Mood and Affect: Mood normal.        Behavior: Behavior normal.        Thought Content: Thought content normal.        Judgment: Judgment normal.     BP 133/66 (BP Location: Right Arm, Patient Position: Sitting, Cuff Size: Normal)   Pulse 65   Temp (!) 97 F (36.1 C) (Temporal)   Wt 146 lb 3.2 oz (66.3 kg)   SpO2 98%   BMI 25.10 kg/m  Wt Readings from Last 3 Encounters:  10/23/24 146  lb 3.2 oz (66.3 kg)  09/25/24 146 lb 4.8 oz (66.4 kg)  08/23/24 141 lb (64 kg)    Lab Results  Component Value Date   TSH 3.510 05/02/2017   Lab Results  Component Value Date   WBC 5.3 09/25/2024   HGB 11.9 09/25/2024   HCT 35.9 09/25/2024   MCV 90 09/25/2024   PLT 238 09/25/2024   Lab Results  Component Value Date   NA 141 09/25/2024   K 4.4 09/25/2024   CO2 23 09/25/2024   GLUCOSE 141 (H) 09/25/2024   BUN 16 09/25/2024   CREATININE 0.80 09/25/2024   BILITOT <0.2 09/25/2024   ALKPHOS 79 09/25/2024   AST 18 09/25/2024   ALT 17 09/25/2024   PROT 7.9 09/25/2024   ALBUMIN 4.4 09/25/2024   CALCIUM 10.1 09/25/2024   ANIONGAP 5 01/01/2019   EGFR 86 09/25/2024   Lab Results  Component Value Date   CHOL 223 (H) 07/26/2024   Lab Results  Component Value Date   HDL 47 07/26/2024   Lab Results  Component Value Date   LDLCALC 152 (H) 07/26/2024   Lab Results  Component Value Date   TRIG 134 07/26/2024   Lab Results  Component Value Date   CHOLHDL 4.7 (H) 07/26/2024   Lab Results  Component Value Date   HGBA1C 7.6 (A) 09/25/2024      Assessment & Plan:   Problem List Items Addressed This Visit       Digestive   Hepatic steatosis   Noted on recent imaging Advised heart healthy low  fat diet, low carb diet Importance of getting DM and hyperlipidemia under control discussed  Advised moderate exercises at least 150 minutes weekly as tolerated         Endocrine   Uncontrolled type 2 diabetes mellitus with hyperglycemia (HCC)   Type 2 diabetes mellitus A1c at 7.6, above target. Concerns about Repatha  affecting blood sugar. - Continue Jardiance  25 mg daily, Actos  45 mg daily, and Metformin  1000 mg twice daily. - Monitor blood sugar levels and dietary intake, avoiding high-sugar fruits and foods. - Recheck A1c at next visit.        Other   Right upper quadrant abdominal pain - Primary   Chronic abdominal pain Persistent right-sided abdominal pain with  fatty liver noted on ultrasound. Differential includes acid reflux. - Referred to gastroenterologist for further evaluation. Continue famotidine  20mg  daily        Relevant Orders   Ambulatory referral to Gastroenterology   Dyslipidemia, goal LDL below 70    Elevated cholesterol. Repatha  started with potential side effect of increased blood sugar. - Continue Simvastatin  80 mg daily and Zetia  10 mg daily. - Monitor cholesterol levels and blood sugar due to potential side effects of Repatha .       Constipation    Intermittent constipation with discomfort. - Recommended Miralax  as needed. - Encouraged increased intake of vegetables and hydration.        Relevant Medications   polyethylene glycol powder (GLYCOLAX /MIRALAX ) 17 GM/SCOOP powder    Meds ordered this encounter  Medications   polyethylene glycol powder (GLYCOLAX /MIRALAX ) 17 GM/SCOOP powder    Sig: Take 17 g by mouth daily as needed. Dissolve 1 capful (17g) in 4-8 ounces of liquid and take by mouth daily.    Dispense:  510 g    Refill:  1    Follow-up: Return in about 10 weeks (around 01/01/2025) for HYPERLIPIDEMIA, DM.    Lakena Sparlin R Zayvier Caravello, FNP     [1]  Allergies Allergen Reactions   Amoxicillin  Swelling    Other reaction(s): swollen/rash   Rosuvastatin Calcium Other (See Comments)   "

## 2024-10-23 NOTE — Assessment & Plan Note (Signed)
 Type 2 diabetes mellitus A1c at 7.6, above target. Concerns about Repatha  affecting blood sugar. - Continue Jardiance  25 mg daily, Actos  45 mg daily, and Metformin  1000 mg twice daily. - Monitor blood sugar levels and dietary intake, avoiding high-sugar fruits and foods. - Recheck A1c at next visit.

## 2024-10-23 NOTE — Assessment & Plan Note (Addendum)
" °  Elevated cholesterol. Repatha  started with potential side effect of increased blood sugar. - Continue Simvastatin  80 mg daily and Zetia  10 mg daily. - Monitor cholesterol levels and blood sugar due to potential side effects of Repatha .  "

## 2024-10-23 NOTE — Assessment & Plan Note (Addendum)
 Chronic abdominal pain Persistent right-sided abdominal pain with fatty liver noted on ultrasound. Differential includes acid reflux. - Referred to gastroenterologist for further evaluation. Continue famotidine  20mg  daily

## 2024-10-30 ENCOUNTER — Ambulatory Visit

## 2025-01-20 ENCOUNTER — Ambulatory Visit: Payer: Self-pay | Admitting: Nurse Practitioner

## 2025-01-21 ENCOUNTER — Institutional Professional Consult (permissible substitution) (HOSPITAL_BASED_OUTPATIENT_CLINIC_OR_DEPARTMENT_OTHER): Admitting: Internal Medicine

## 2025-05-19 ENCOUNTER — Ambulatory Visit: Admitting: Physician Assistant
